# Patient Record
Sex: Male | Born: 1940
Health system: Southern US, Community
[De-identification: ages and names within clinical notes are randomized; demographics above are authoritative.]

## PROBLEM LIST (undated history)

## (undated) DIAGNOSIS — E7800 Pure hypercholesterolemia, unspecified: Secondary | ICD-10-CM

## (undated) DIAGNOSIS — I10 Essential (primary) hypertension: Secondary | ICD-10-CM

## (undated) DIAGNOSIS — M1990 Unspecified osteoarthritis, unspecified site: Secondary | ICD-10-CM

## (undated) DIAGNOSIS — Z8739 Personal history of other diseases of the musculoskeletal system and connective tissue: Secondary | ICD-10-CM

## (undated) DIAGNOSIS — Z972 Presence of dental prosthetic device (complete) (partial): Secondary | ICD-10-CM

## (undated) DIAGNOSIS — Z950 Presence of cardiac pacemaker: Secondary | ICD-10-CM

## (undated) DIAGNOSIS — G4733 Obstructive sleep apnea (adult) (pediatric): Secondary | ICD-10-CM

## (undated) HISTORY — DX: Essential (primary) hypertension: I10

---

## 1997-11-23 ENCOUNTER — Emergency Department (HOSPITAL_COMMUNITY): Admission: EM | Admit: 1997-11-23 | Discharge: 1997-11-23 | Payer: Self-pay | Admitting: Emergency Medicine

## 1997-11-23 ENCOUNTER — Encounter: Payer: Self-pay | Admitting: Emergency Medicine

## 1998-06-11 ENCOUNTER — Ambulatory Visit (HOSPITAL_COMMUNITY): Admission: RE | Admit: 1998-06-11 | Discharge: 1998-06-11 | Payer: Self-pay | Admitting: Family Medicine

## 1998-10-04 ENCOUNTER — Encounter: Payer: Self-pay | Admitting: Emergency Medicine

## 1998-10-04 ENCOUNTER — Emergency Department (HOSPITAL_COMMUNITY): Admission: EM | Admit: 1998-10-04 | Discharge: 1998-10-04 | Payer: Self-pay | Admitting: Emergency Medicine

## 1999-08-10 ENCOUNTER — Ambulatory Visit (HOSPITAL_COMMUNITY): Admission: RE | Admit: 1999-08-10 | Discharge: 1999-08-10 | Payer: Self-pay | Admitting: Family Medicine

## 1999-08-10 ENCOUNTER — Encounter: Payer: Self-pay | Admitting: Family Medicine

## 2000-05-11 ENCOUNTER — Ambulatory Visit (HOSPITAL_COMMUNITY): Admission: RE | Admit: 2000-05-11 | Discharge: 2000-05-11 | Payer: Self-pay | Admitting: Family Medicine

## 2000-09-28 ENCOUNTER — Emergency Department (HOSPITAL_COMMUNITY): Admission: EM | Admit: 2000-09-28 | Discharge: 2000-09-28 | Payer: Self-pay | Admitting: Emergency Medicine

## 2000-09-28 ENCOUNTER — Encounter: Payer: Self-pay | Admitting: Emergency Medicine

## 2000-11-13 ENCOUNTER — Ambulatory Visit (HOSPITAL_COMMUNITY): Admission: RE | Admit: 2000-11-13 | Discharge: 2000-11-13 | Payer: Self-pay | Admitting: *Deleted

## 2001-10-07 ENCOUNTER — Emergency Department (HOSPITAL_COMMUNITY): Admission: EM | Admit: 2001-10-07 | Discharge: 2001-10-07 | Payer: Self-pay | Admitting: *Deleted

## 2002-10-29 ENCOUNTER — Encounter: Admission: RE | Admit: 2002-10-29 | Discharge: 2002-10-29 | Payer: Self-pay | Admitting: Family Medicine

## 2002-10-29 ENCOUNTER — Encounter: Payer: Self-pay | Admitting: Family Medicine

## 2002-12-11 ENCOUNTER — Encounter: Payer: Self-pay | Admitting: Family Medicine

## 2002-12-11 ENCOUNTER — Encounter: Admission: RE | Admit: 2002-12-11 | Discharge: 2002-12-11 | Payer: Self-pay | Admitting: Family Medicine

## 2003-09-03 ENCOUNTER — Emergency Department (HOSPITAL_COMMUNITY): Admission: EM | Admit: 2003-09-03 | Discharge: 2003-09-03 | Payer: Self-pay | Admitting: Emergency Medicine

## 2004-08-17 ENCOUNTER — Emergency Department (HOSPITAL_COMMUNITY): Admission: EM | Admit: 2004-08-17 | Discharge: 2004-08-17 | Payer: Self-pay | Admitting: Emergency Medicine

## 2004-11-28 ENCOUNTER — Emergency Department (HOSPITAL_COMMUNITY): Admission: EM | Admit: 2004-11-28 | Discharge: 2004-11-28 | Payer: Self-pay | Admitting: Family Medicine

## 2005-01-14 ENCOUNTER — Encounter: Admission: RE | Admit: 2005-01-14 | Discharge: 2005-01-14 | Payer: Self-pay | Admitting: Family Medicine

## 2005-03-05 ENCOUNTER — Emergency Department (HOSPITAL_COMMUNITY): Admission: EM | Admit: 2005-03-05 | Discharge: 2005-03-05 | Payer: Self-pay | Admitting: Emergency Medicine

## 2005-08-19 ENCOUNTER — Emergency Department (HOSPITAL_COMMUNITY): Admission: EM | Admit: 2005-08-19 | Discharge: 2005-08-19 | Payer: Self-pay | Admitting: Family Medicine

## 2006-04-13 ENCOUNTER — Emergency Department (HOSPITAL_COMMUNITY): Admission: EM | Admit: 2006-04-13 | Discharge: 2006-04-13 | Payer: Self-pay | Admitting: Emergency Medicine

## 2006-06-07 ENCOUNTER — Encounter (INDEPENDENT_AMBULATORY_CARE_PROVIDER_SITE_OTHER): Payer: Self-pay | Admitting: Specialist

## 2006-06-07 ENCOUNTER — Observation Stay (HOSPITAL_COMMUNITY): Admission: RE | Admit: 2006-06-07 | Discharge: 2006-06-08 | Payer: Self-pay | Admitting: Otolaryngology

## 2006-06-07 HISTORY — PX: MICROLARYNGOSCOPY: SHX5208

## 2006-07-05 ENCOUNTER — Encounter: Admission: RE | Admit: 2006-07-05 | Discharge: 2006-10-03 | Payer: Self-pay | Admitting: Otolaryngology

## 2008-02-13 ENCOUNTER — Emergency Department (HOSPITAL_COMMUNITY): Admission: EM | Admit: 2008-02-13 | Discharge: 2008-02-13 | Payer: Self-pay | Admitting: Emergency Medicine

## 2009-05-26 ENCOUNTER — Emergency Department (HOSPITAL_COMMUNITY): Admission: EM | Admit: 2009-05-26 | Discharge: 2009-05-26 | Payer: Self-pay | Admitting: Emergency Medicine

## 2009-05-30 ENCOUNTER — Emergency Department (HOSPITAL_COMMUNITY): Admission: EM | Admit: 2009-05-30 | Discharge: 2009-05-30 | Payer: Self-pay | Admitting: Emergency Medicine

## 2009-07-21 ENCOUNTER — Inpatient Hospital Stay (HOSPITAL_COMMUNITY): Admission: RE | Admit: 2009-07-21 | Discharge: 2009-07-22 | Payer: Self-pay | Admitting: Orthopedic Surgery

## 2009-07-21 ENCOUNTER — Encounter (INDEPENDENT_AMBULATORY_CARE_PROVIDER_SITE_OTHER): Payer: Self-pay | Admitting: Orthopedic Surgery

## 2009-07-21 HISTORY — PX: ELBOW BURSA SURGERY: SHX615

## 2010-05-24 LAB — ANAEROBIC CULTURE

## 2010-05-24 LAB — TISSUE CULTURE: Culture: NO GROWTH

## 2010-05-24 LAB — COMPREHENSIVE METABOLIC PANEL
Albumin: 3.4 g/dL — ABNORMAL LOW (ref 3.5–5.2)
BUN: 28 mg/dL — ABNORMAL HIGH (ref 6–23)
Chloride: 108 mEq/L (ref 96–112)
Creatinine, Ser: 1.61 mg/dL — ABNORMAL HIGH (ref 0.4–1.5)
Glucose, Bld: 102 mg/dL — ABNORMAL HIGH (ref 70–99)
Total Bilirubin: 0.6 mg/dL (ref 0.3–1.2)

## 2010-05-24 LAB — CBC
HCT: 38.9 % — ABNORMAL LOW (ref 39.0–52.0)
Hemoglobin: 13.4 g/dL (ref 13.0–17.0)
MCV: 91.9 fL (ref 78.0–100.0)
Platelets: 219 10*3/uL (ref 150–400)
RDW: 13.9 % (ref 11.5–15.5)
WBC: 7.5 10*3/uL (ref 4.0–10.5)

## 2010-05-24 LAB — DIFFERENTIAL
Basophils Absolute: 0 10*3/uL (ref 0.0–0.1)
Basophils Relative: 1 % (ref 0–1)
Lymphocytes Relative: 32 % (ref 12–46)
Monocytes Absolute: 0.5 10*3/uL (ref 0.1–1.0)
Neutro Abs: 4.2 10*3/uL (ref 1.7–7.7)
Neutrophils Relative %: 56 % (ref 43–77)

## 2010-05-24 LAB — AFB CULTURE WITH SMEAR (NOT AT ARMC): Acid Fast Smear: NONE SEEN

## 2010-05-24 LAB — WOUND CULTURE

## 2010-07-23 NOTE — Op Note (Signed)
NAMETERRAN, [name]             ACCOUNT NO.:  [id number]   MEDICAL RECORD NO.:  [id number]          PATIENT TYPE:  OBV   LOCATION:  2550                         FACILITY:  [facility]   PHYSICIAN:  Zola Button T. Lazarus Salines, M.D. DATE OF BIRTH:  Apr 21, 1940   DATE OF PROCEDURE:  06/07/2006  DATE OF DISCHARGE:                               OPERATIVE REPORT   PREOPERATIVE DIAGNOSIS:  Bilateral vocal cord lesions.   POSTOPERATIVE DIAGNOSES:  Bilateral benign vocal nodules.   PROCEDURE PERFORMED:  Microlaryngoscopy with excision bilateral vocal  cord lesions.   ANESTHESIA:  General orotracheal.   BLOOD LOSS:  None.   COMPLICATIONS:  None.   FINDINGS:  Semi-pedunculated small left anterior vocal cord lesion.  A  sessile 5 mm right vocal cord lesion with a slightly irregular surface  contour.   PROCEDURE:  With the patient in the comfortable supine position, general  orotracheal anesthesia was induced without difficulty.  At an  appropriate level, the table was turned 90 degrees and the patient  placed in a slight reverse Trendelenburg.  A rubber tooth guard was  placed.  Several different varieties of anterior commissure scopes were  inserted, the first two noted to be simply too big to work effectively.  Finally the Jako laryngoscope was introduced and passed into the  glottis.  The anterior vocal cords were visualized.  The scope was  suspended in the standard fashion.  A 1/2 x 1 inch cottonoid with 1:1000  epinephrine solution was placed against the anterior vocal cords for  intraoperative hemostasis.  This was allowed to remain in place for  several minutes and then removed.  The findings were as described above.   Beginning on the left side, a sharp sickle knife was used to make a  delimiting incision just above the lesion on the left side.  The sickle  knife was used to dissect the membrane away from the vocalis muscle  atraumatically.  Upon dissecting to the inferior extent of the  lesion,  anterior and posterior delimiting incisions were made with sharp  scissors and then using the sickle knife, the lesion was excised and  sent for permanent interpretation.  Hemostasis was spontaneous.   Next onto the right side, a delimiting incision was made with a sharp  sickle knife, just above the lesion.  A spatula knife was used to  dissect Reinke's layer away from the vocalis muscle to the full extent  of the lesion.  Anterior and posterior delimiting incisions were made  using curved laryngeal scissors and these curved incisions were carried  beneath the lesion and the lesion was fully excised.  It was sent for  frozen section interpretation.  A small additional tag of mucosa at the  anterior border was excised and included with the specimen.  Hemostasis  was spontaneous.  The incision on the left side had a tendency to close  spontaneously.  On the right side the larger incision did not completely  close spontaneously.  Another epinephrine moistened pledget was placed  between the vocal cords for hemostasis.  The laryngoscope was partially  unsuspended to take the  tension off of the tongue base while awaiting  the frozen section report.   After receiving the frozen section report as benign, the pledget was  removed.  A small amount of blood was suctioned free from the endolarynx  and from the pharynx.  The laryngoscope was unsuspended and gently  removed.  The rubber tooth guard was removed and the dental status was  intact.  The patient was returned to anesthesia, awakened, extubated,  and transferred to recovery in stable condition.   COMMENT:  A 69 year old black male with a history of hoarseness and  throat clearing and lesions on office examination not consistent with  benign mature vocal nodules but suspicious for possible papillomatosis  was indication for today's procedure.  Anticipate routine postoperative  recovery with attention to analgesia, antireflux  measures, elevation,  and relative voice rest.  Given his obstructive sleep apnea, will  observe him 23-hour observation.      Gloris Manchester. Lazarus Salines, M.D.  Electronically Signed     KTW/MEDQ  D:  06/07/2006  T:  06/07/2006  Job:  [id number]   cc:   Renaye Rakers, M.D.

## 2010-10-07 ENCOUNTER — Encounter: Payer: Self-pay | Admitting: Gastroenterology

## 2010-11-04 ENCOUNTER — Ambulatory Visit (INDEPENDENT_AMBULATORY_CARE_PROVIDER_SITE_OTHER): Payer: Medicare Other | Admitting: Gastroenterology

## 2010-11-04 ENCOUNTER — Encounter: Payer: Self-pay | Admitting: Gastroenterology

## 2010-11-04 VITALS — BP 110/50 | HR 80 | Ht 69.0 in | Wt 239.6 lb

## 2010-11-04 DIAGNOSIS — N289 Disorder of kidney and ureter, unspecified: Secondary | ICD-10-CM | POA: Insufficient documentation

## 2010-11-04 DIAGNOSIS — R609 Edema, unspecified: Secondary | ICD-10-CM

## 2010-11-04 DIAGNOSIS — M109 Gout, unspecified: Secondary | ICD-10-CM

## 2010-11-04 DIAGNOSIS — G4730 Sleep apnea, unspecified: Secondary | ICD-10-CM

## 2010-11-04 DIAGNOSIS — R600 Localized edema: Secondary | ICD-10-CM

## 2010-11-04 DIAGNOSIS — K625 Hemorrhage of anus and rectum: Secondary | ICD-10-CM

## 2010-11-04 MED ORDER — PEG-KCL-NACL-NASULF-NA ASC-C 100 G PO SOLR: 1.0000 | Freq: Once | ORAL | Status: DC

## 2010-11-04 NOTE — Progress Notes (Signed)
History of Present Illness:  This is a very pleasant 70 year old African American male who has had some occasional bright red blood with wiping. Had colonoscopy in 1999 which was a very poor exam because of a poor prep. That time he did have prominent mixed hemorrhoids. Otherwise, he currently is asymptomatic and denies abdominal pain, irregular bowel habits, or any upper GI or hepatobiliary complaints. Review of his labs from July show normal CBC with mild renal insufficiency with a serum creatinine of 1.6 mg percent. The patient does have essential hypertension. He denies abuse of alcohol, cigarettes, or NSAIDs. His appetite is good and his weight is stable, and he denies any food intolerances. Past history is positive for hypertension and sleep apnea with a CPAP machine use.  I have reviewed this patient's present history, medical and surgical past history, allergies and medications.     ROS: The remainder of the 10 point ROS is negative... history of peripheral edema with daily Lasix use. He denies concurrent cardiovascular pulmonary, or genitourinary problems. Family history is noncontributory. He does have a history of gouty arthritis but denies current rheumatologic problems. The patient takes Zyloprim 300 mg a day.     Physical Exam: General well developed well nourished patient in no acute distress, appearing his stated age Eyes PERRLA, no icterus, fundoscopic exam per opthamologist Skin no lesions noted Neck supple, no adenopathy, no thyroid enlargement, no tenderness Chest clear to percussion and auscultation Heart no significant murmurs, gallops or rubs noted Abdomen no hepatosplenomegaly masses or tenderness, BS normal.  Extremities no acute joint lesions, there is trace to +1 pitting edema in the pretibial areas. There is no evidence of phlebitis or swollen hot joints. Neurologic patient oriented x 3, cranial nerves intact, no focal neurologic deficits noted. Psychological mental  status normal and normal affect.  Assessment and plan: Bright red blood per rectum probably from mixed hemorrhoids. He is due for followup colonoscopy screening which has been scheduled with propofol sedation because of his sleep apnea and mild chronic renal insufficiency from hypertension. Blood pressure today is 110/50 and pulse was 80 and regular. He is obese with a BMI of 35.38.  Encounter Diagnosis  Name Primary?  Marland Kitchen BRBPR (bright red blood per rectum) Yes

## 2010-11-04 NOTE — Patient Instructions (Signed)
Your procedure has been scheduled for 11/19/2010, please follow the seperate instructions.  Your prescription(s) have been sent to you pharmacy.   

## 2010-11-05 ENCOUNTER — Encounter: Payer: Self-pay | Admitting: Gastroenterology

## 2010-11-19 ENCOUNTER — Encounter: Payer: Self-pay | Admitting: Gastroenterology

## 2010-11-19 ENCOUNTER — Ambulatory Visit (AMBULATORY_SURGERY_CENTER): Payer: Medicare Other | Admitting: Gastroenterology

## 2010-11-19 VITALS — BP 147/76 | HR 72 | Temp 98.8°F | Resp 16 | Ht 69.0 in | Wt 239.0 lb

## 2010-11-19 DIAGNOSIS — K5730 Diverticulosis of large intestine without perforation or abscess without bleeding: Secondary | ICD-10-CM

## 2010-11-19 DIAGNOSIS — K648 Other hemorrhoids: Secondary | ICD-10-CM

## 2010-11-19 DIAGNOSIS — K625 Hemorrhage of anus and rectum: Secondary | ICD-10-CM

## 2010-11-19 MED ORDER — SODIUM CHLORIDE 0.9 % IV SOLN: 500.0000 mL | INTRAVENOUS | Status: DC

## 2010-11-19 NOTE — Patient Instructions (Signed)
Discharge instructions instructions given with verbal understanding. Handouts on diverticulosis and a high fiber diet given. Resume previous medications.

## 2010-11-22 ENCOUNTER — Telehealth: Payer: Self-pay | Admitting: *Deleted

## 2010-11-22 NOTE — Telephone Encounter (Signed)

## 2011-01-21 ENCOUNTER — Emergency Department (HOSPITAL_COMMUNITY): Payer: No Typology Code available for payment source

## 2011-01-21 ENCOUNTER — Emergency Department (HOSPITAL_COMMUNITY)
Admission: EM | Admit: 2011-01-21 | Discharge: 2011-01-21 | Disposition: A | Discharge status: Home / Self Care | Payer: No Typology Code available for payment source | Attending: Emergency Medicine | Admitting: Emergency Medicine

## 2011-01-21 ENCOUNTER — Encounter (HOSPITAL_COMMUNITY): Payer: Self-pay | Admitting: Cardiology

## 2011-01-21 DIAGNOSIS — S39012A Strain of muscle, fascia and tendon of lower back, initial encounter: Secondary | ICD-10-CM

## 2011-01-21 DIAGNOSIS — IMO0002 Reserved for concepts with insufficient information to code with codable children: Secondary | ICD-10-CM

## 2011-01-21 DIAGNOSIS — S139XXA Sprain of joints and ligaments of unspecified parts of neck, initial encounter: Secondary | ICD-10-CM | POA: Insufficient documentation

## 2011-01-21 DIAGNOSIS — M545 Low back pain, unspecified: Secondary | ICD-10-CM | POA: Insufficient documentation

## 2011-01-21 DIAGNOSIS — M546 Pain in thoracic spine: Secondary | ICD-10-CM | POA: Insufficient documentation

## 2011-01-21 DIAGNOSIS — S239XXA Sprain of unspecified parts of thorax, initial encounter: Secondary | ICD-10-CM | POA: Insufficient documentation

## 2011-01-21 DIAGNOSIS — M25519 Pain in unspecified shoulder: Secondary | ICD-10-CM | POA: Insufficient documentation

## 2011-01-21 DIAGNOSIS — S335XXA Sprain of ligaments of lumbar spine, initial encounter: Secondary | ICD-10-CM | POA: Insufficient documentation

## 2011-01-21 DIAGNOSIS — M542 Cervicalgia: Secondary | ICD-10-CM | POA: Insufficient documentation

## 2011-01-21 DIAGNOSIS — J45909 Unspecified asthma, uncomplicated: Secondary | ICD-10-CM | POA: Insufficient documentation

## 2011-01-21 DIAGNOSIS — S161XXA Strain of muscle, fascia and tendon at neck level, initial encounter: Secondary | ICD-10-CM

## 2011-01-21 DIAGNOSIS — G4730 Sleep apnea, unspecified: Secondary | ICD-10-CM | POA: Insufficient documentation

## 2011-01-21 DIAGNOSIS — I10 Essential (primary) hypertension: Secondary | ICD-10-CM | POA: Insufficient documentation

## 2011-01-21 LAB — URINALYSIS, ROUTINE W REFLEX MICROSCOPIC
Glucose, UA: NEGATIVE mg/dL
Leukocytes, UA: NEGATIVE
Nitrite: NEGATIVE
Protein, ur: NEGATIVE mg/dL
Urobilinogen, UA: 0.2 mg/dL (ref 0.0–1.0)

## 2011-01-21 MED ORDER — TRAMADOL HCL 50 MG PO TABS: 50.0000 mg | ORAL_TABLET | Freq: Four times a day (QID) | ORAL | Status: AC | PRN
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
## 2011-01-21 NOTE — ED Notes (Signed)
Pt removed from the LSB via Dr. Karma Ganja. Pt remains in c-collar at this time. No acute distress noted.

## 2011-10-03 ENCOUNTER — Emergency Department (HOSPITAL_COMMUNITY)
Admission: EM | Admit: 2011-10-03 | Discharge: 2011-10-03 | Disposition: A | Discharge status: Home / Self Care | Payer: No Typology Code available for payment source | Attending: Emergency Medicine | Admitting: Emergency Medicine

## 2011-10-03 ENCOUNTER — Encounter (HOSPITAL_COMMUNITY): Payer: Self-pay | Admitting: Emergency Medicine

## 2011-10-03 ENCOUNTER — Emergency Department (HOSPITAL_COMMUNITY): Payer: No Typology Code available for payment source

## 2011-10-03 DIAGNOSIS — Y92410 Unspecified street and highway as the place of occurrence of the external cause: Secondary | ICD-10-CM | POA: Insufficient documentation

## 2011-10-03 DIAGNOSIS — I10 Essential (primary) hypertension: Secondary | ICD-10-CM | POA: Insufficient documentation

## 2011-10-03 DIAGNOSIS — M542 Cervicalgia: Secondary | ICD-10-CM | POA: Insufficient documentation

## 2011-10-03 DIAGNOSIS — G4730 Sleep apnea, unspecified: Secondary | ICD-10-CM | POA: Insufficient documentation

## 2011-10-03 DIAGNOSIS — J45909 Unspecified asthma, uncomplicated: Secondary | ICD-10-CM | POA: Insufficient documentation

## 2011-10-03 MED ORDER — HYDROCODONE-ACETAMINOPHEN 5-325 MG PO TABS
1.0000 | ORAL_TABLET | Freq: Once | ORAL | Status: AC
  Administered 2011-10-03: 1 via ORAL
  Filled 2011-10-03: qty 1

## 2011-10-03 MED ORDER — METHOCARBAMOL 500 MG PO TABS: 500.0000 mg | ORAL_TABLET | Freq: Two times a day (BID) | ORAL | Status: AC
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
## 2011-10-03 NOTE — ED Notes (Signed)
Patient transported to X-ray 
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
## 2012-01-17 ENCOUNTER — Encounter (HOSPITAL_COMMUNITY): Payer: Self-pay | Admitting: *Deleted

## 2012-01-17 ENCOUNTER — Emergency Department (HOSPITAL_COMMUNITY)
Admission: EM | Admit: 2012-01-17 | Discharge: 2012-01-17 | Disposition: A | Discharge status: Home / Self Care | Payer: Medicare Other | Source: Home / Self Care | Attending: Family Medicine | Admitting: Family Medicine

## 2012-01-17 ENCOUNTER — Emergency Department (INDEPENDENT_AMBULATORY_CARE_PROVIDER_SITE_OTHER): Payer: Medicare Other

## 2012-01-17 DIAGNOSIS — S92309A Fracture of unspecified metatarsal bone(s), unspecified foot, initial encounter for closed fracture: Secondary | ICD-10-CM

## 2012-01-17 DIAGNOSIS — S92301A Fracture of unspecified metatarsal bone(s), right foot, initial encounter for closed fracture: Secondary | ICD-10-CM

## 2012-01-17 MED ORDER — HYDROCODONE-ACETAMINOPHEN 5-325 MG PO TABS: 1.0000 | ORAL_TABLET | ORAL | Status: DC | PRN
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
## 2012-01-17 NOTE — ED Notes (Addendum)
Pt reports yesterday he was on a ladder cleaning gutters and fell onto his feet.  Today he c/o pain right foot--plantar surface.  Yesterday he had low back pain, which is now resolved.   He denies LOC.  or head/neck pain today.

## 2013-03-07 HISTORY — PX: HAMMER TOE SURGERY: SHX385

## 2014-06-17 DIAGNOSIS — I10 Essential (primary) hypertension: Secondary | ICD-10-CM | POA: Diagnosis not present

## 2014-06-17 DIAGNOSIS — E782 Mixed hyperlipidemia: Secondary | ICD-10-CM | POA: Diagnosis not present

## 2014-06-24 DIAGNOSIS — M1000 Idiopathic gout, unspecified site: Secondary | ICD-10-CM | POA: Diagnosis not present

## 2014-06-24 DIAGNOSIS — R7301 Impaired fasting glucose: Secondary | ICD-10-CM | POA: Diagnosis not present

## 2014-06-24 DIAGNOSIS — I10 Essential (primary) hypertension: Secondary | ICD-10-CM | POA: Diagnosis not present

## 2014-06-24 DIAGNOSIS — E782 Mixed hyperlipidemia: Secondary | ICD-10-CM | POA: Diagnosis not present

## 2014-10-14 DIAGNOSIS — L84 Corns and callosities: Secondary | ICD-10-CM | POA: Diagnosis not present

## 2014-10-14 DIAGNOSIS — D2271 Melanocytic nevi of right lower limb, including hip: Secondary | ICD-10-CM | POA: Diagnosis not present

## 2014-10-14 DIAGNOSIS — M2041 Other hammer toe(s) (acquired), right foot: Secondary | ICD-10-CM | POA: Diagnosis not present

## 2014-10-14 DIAGNOSIS — D485 Neoplasm of uncertain behavior of skin: Secondary | ICD-10-CM | POA: Diagnosis not present

## 2014-10-14 DIAGNOSIS — L602 Onychogryphosis: Secondary | ICD-10-CM | POA: Diagnosis not present

## 2014-10-27 DIAGNOSIS — H02423 Myogenic ptosis of bilateral eyelids: Secondary | ICD-10-CM | POA: Diagnosis not present

## 2014-10-27 DIAGNOSIS — H25813 Combined forms of age-related cataract, bilateral: Secondary | ICD-10-CM | POA: Diagnosis not present

## 2014-11-04 DIAGNOSIS — D485 Neoplasm of uncertain behavior of skin: Secondary | ICD-10-CM | POA: Diagnosis not present

## 2014-11-04 DIAGNOSIS — M2041 Other hammer toe(s) (acquired), right foot: Secondary | ICD-10-CM | POA: Diagnosis not present

## 2014-11-27 DIAGNOSIS — Z01818 Encounter for other preprocedural examination: Secondary | ICD-10-CM | POA: Diagnosis not present

## 2014-11-27 DIAGNOSIS — R7301 Impaired fasting glucose: Secondary | ICD-10-CM | POA: Diagnosis not present

## 2014-11-27 DIAGNOSIS — I10 Essential (primary) hypertension: Secondary | ICD-10-CM | POA: Diagnosis not present

## 2014-11-27 DIAGNOSIS — M1000 Idiopathic gout, unspecified site: Secondary | ICD-10-CM | POA: Diagnosis not present

## 2014-11-27 DIAGNOSIS — E782 Mixed hyperlipidemia: Secondary | ICD-10-CM | POA: Diagnosis not present

## 2014-12-03 ENCOUNTER — Encounter (HOSPITAL_BASED_OUTPATIENT_CLINIC_OR_DEPARTMENT_OTHER): Payer: Self-pay | Admitting: *Deleted

## 2014-12-05 DIAGNOSIS — M2041 Other hammer toe(s) (acquired), right foot: Secondary | ICD-10-CM | POA: Diagnosis not present

## 2014-12-05 DIAGNOSIS — D485 Neoplasm of uncertain behavior of skin: Secondary | ICD-10-CM | POA: Diagnosis not present

## 2014-12-08 ENCOUNTER — Encounter (HOSPITAL_BASED_OUTPATIENT_CLINIC_OR_DEPARTMENT_OTHER): Payer: Self-pay | Admitting: *Deleted

## 2014-12-08 NOTE — Progress Notes (Signed)
NPO AFTER MN.  ARRIVE AT 0700.  NEEDS ISTAT AND EKG.  WILL TAKE AM MEDS WITH EXCEPTION NO LASIX W/ SIPS OF WATER DOS.

## 2014-12-10 ENCOUNTER — Encounter (HOSPITAL_BASED_OUTPATIENT_CLINIC_OR_DEPARTMENT_OTHER): Payer: Self-pay | Admitting: Podiatry

## 2014-12-11 ENCOUNTER — Other Ambulatory Visit: Payer: Self-pay

## 2014-12-11 ENCOUNTER — Encounter (HOSPITAL_BASED_OUTPATIENT_CLINIC_OR_DEPARTMENT_OTHER)
Admission: RE | Disposition: A | Discharge status: Home / Self Care | Payer: Self-pay | Source: Ambulatory Visit | Attending: Podiatry

## 2014-12-11 ENCOUNTER — Ambulatory Visit (HOSPITAL_BASED_OUTPATIENT_CLINIC_OR_DEPARTMENT_OTHER): Payer: Commercial Managed Care - HMO | Admitting: Anesthesiology

## 2014-12-11 ENCOUNTER — Encounter (HOSPITAL_BASED_OUTPATIENT_CLINIC_OR_DEPARTMENT_OTHER): Payer: Self-pay | Admitting: *Deleted

## 2014-12-11 ENCOUNTER — Ambulatory Visit (HOSPITAL_BASED_OUTPATIENT_CLINIC_OR_DEPARTMENT_OTHER)
Admission: RE | Admit: 2014-12-11 | Discharge: 2014-12-11 | Disposition: A | Discharge status: Home / Self Care | Payer: Commercial Managed Care - HMO | Source: Ambulatory Visit | Attending: Podiatry | Admitting: Podiatry

## 2014-12-11 DIAGNOSIS — Z7982 Long term (current) use of aspirin: Secondary | ICD-10-CM | POA: Insufficient documentation

## 2014-12-11 DIAGNOSIS — M2041 Other hammer toe(s) (acquired), right foot: Secondary | ICD-10-CM | POA: Diagnosis not present

## 2014-12-11 DIAGNOSIS — N189 Chronic kidney disease, unspecified: Secondary | ICD-10-CM | POA: Insufficient documentation

## 2014-12-11 DIAGNOSIS — M24574 Contracture, right foot: Secondary | ICD-10-CM | POA: Diagnosis not present

## 2014-12-11 DIAGNOSIS — I4510 Unspecified right bundle-branch block: Secondary | ICD-10-CM | POA: Insufficient documentation

## 2014-12-11 DIAGNOSIS — Z79899 Other long term (current) drug therapy: Secondary | ICD-10-CM | POA: Insufficient documentation

## 2014-12-11 DIAGNOSIS — R7301 Impaired fasting glucose: Secondary | ICD-10-CM | POA: Diagnosis not present

## 2014-12-11 DIAGNOSIS — M1990 Unspecified osteoarthritis, unspecified site: Secondary | ICD-10-CM | POA: Diagnosis not present

## 2014-12-11 DIAGNOSIS — L989 Disorder of the skin and subcutaneous tissue, unspecified: Secondary | ICD-10-CM | POA: Insufficient documentation

## 2014-12-11 DIAGNOSIS — E782 Mixed hyperlipidemia: Secondary | ICD-10-CM | POA: Insufficient documentation

## 2014-12-11 DIAGNOSIS — I10 Essential (primary) hypertension: Secondary | ICD-10-CM | POA: Insufficient documentation

## 2014-12-11 DIAGNOSIS — M109 Gout, unspecified: Secondary | ICD-10-CM | POA: Diagnosis not present

## 2014-12-11 DIAGNOSIS — I129 Hypertensive chronic kidney disease with stage 1 through stage 4 chronic kidney disease, or unspecified chronic kidney disease: Secondary | ICD-10-CM | POA: Diagnosis not present

## 2014-12-11 DIAGNOSIS — G4730 Sleep apnea, unspecified: Secondary | ICD-10-CM | POA: Insufficient documentation

## 2014-12-11 DIAGNOSIS — D485 Neoplasm of uncertain behavior of skin: Secondary | ICD-10-CM | POA: Diagnosis not present

## 2014-12-11 HISTORY — PX: HAMMER TOE SURGERY: SHX385

## 2014-12-11 HISTORY — DX: Presence of dental prosthetic device (complete) (partial): Z97.2

## 2014-12-11 HISTORY — DX: Unspecified osteoarthritis, unspecified site: M19.90

## 2014-12-11 HISTORY — DX: Obstructive sleep apnea (adult) (pediatric): G47.33

## 2014-12-11 LAB — POCT I-STAT, CHEM 8
BUN: 24 mg/dL — ABNORMAL HIGH (ref 6–20)
Calcium, Ion: 1.21 mmol/L (ref 1.13–1.30)
Chloride: 104 mmol/L (ref 101–111)
Creatinine, Ser: 1.1 mg/dL (ref 0.61–1.24)
Glucose, Bld: 104 mg/dL — ABNORMAL HIGH (ref 65–99)
HCT: 40 % (ref 39.0–52.0)
Hemoglobin: 13.6 g/dL (ref 13.0–17.0)
Potassium: 4.6 mmol/L (ref 3.5–5.1)
Sodium: 140 mmol/L (ref 135–145)
TCO2: 22 mmol/L (ref 0–100)

## 2014-12-11 SURGERY — CORRECTION, HAMMER TOE
Anesthesia: General | Site: Foot | Laterality: Right

## 2014-12-11 MED ORDER — LACTATED RINGERS IV SOLN
INTRAVENOUS | Status: DC
  Administered 2014-12-11 (×2): via INTRAVENOUS
  Filled 2014-12-11: qty 1000

## 2014-12-11 MED ORDER — FENTANYL CITRATE (PF) 100 MCG/2ML IJ SOLN
INTRAMUSCULAR | Status: AC
  Filled 2014-12-11: qty 4

## 2014-12-11 MED ORDER — ACETAMINOPHEN 325 MG PO TABS
650.0000 mg | ORAL_TABLET | ORAL | Status: DC | PRN
  Filled 2014-12-11: qty 2

## 2014-12-11 MED ORDER — SODIUM CHLORIDE 0.9 % IR SOLN
Status: DC | PRN
  Administered 2014-12-11: 500 mL

## 2014-12-11 MED ORDER — DEXAMETHASONE SODIUM PHOSPHATE 4 MG/ML IJ SOLN
INTRAMUSCULAR | Status: DC | PRN
  Administered 2014-12-11: 10 mg via INTRAVENOUS

## 2014-12-11 MED ORDER — FENTANYL CITRATE (PF) 100 MCG/2ML IJ SOLN
25.0000 ug | INTRAMUSCULAR | Status: DC | PRN
  Filled 2014-12-11: qty 1

## 2014-12-11 MED ORDER — ACETAMINOPHEN 10 MG/ML IV SOLN
INTRAVENOUS | Status: DC | PRN
  Administered 2014-12-11: 1000 mg via INTRAVENOUS

## 2014-12-11 MED ORDER — PROPOFOL 10 MG/ML IV BOLUS
INTRAVENOUS | Status: DC | PRN
  Administered 2014-12-11: 200 mg via INTRAVENOUS

## 2014-12-11 MED ORDER — FENTANYL CITRATE (PF) 100 MCG/2ML IJ SOLN
INTRAMUSCULAR | Status: DC | PRN
  Administered 2014-12-11: 25 ug via INTRAVENOUS

## 2014-12-11 MED ORDER — LACTATED RINGERS IV SOLN
INTRAVENOUS | Status: DC
  Filled 2014-12-11: qty 1000

## 2014-12-11 MED ORDER — HYDROCODONE-ACETAMINOPHEN 5-325 MG PO TABS
1.0000 | ORAL_TABLET | ORAL | Status: DC | PRN
  Filled 2014-12-11: qty 1

## 2014-12-11 MED ORDER — MIDAZOLAM HCL 2 MG/2ML IJ SOLN
INTRAMUSCULAR | Status: AC
  Filled 2014-12-11: qty 2

## 2014-12-11 MED ORDER — SODIUM CHLORIDE 0.9 % IJ SOLN
3.0000 mL | INTRAMUSCULAR | Status: DC | PRN
  Filled 2014-12-11: qty 3

## 2014-12-11 MED ORDER — CEFAZOLIN SODIUM-DEXTROSE 2-3 GM-% IV SOLR
INTRAVENOUS | Status: AC
  Filled 2014-12-11: qty 50

## 2014-12-11 MED ORDER — LIDOCAINE HCL 2 % IJ SOLN
INTRAMUSCULAR | Status: DC | PRN
  Administered 2014-12-11: 5 mL

## 2014-12-11 MED ORDER — CEFAZOLIN SODIUM-DEXTROSE 2-3 GM-% IV SOLR
2.0000 g | Freq: Once | INTRAVENOUS | Status: AC
  Administered 2014-12-11: 2 g via INTRAVENOUS
  Filled 2014-12-11: qty 50

## 2014-12-11 MED ORDER — ONDANSETRON HCL 4 MG/2ML IJ SOLN
INTRAMUSCULAR | Status: DC | PRN
  Administered 2014-12-11: 4 mg via INTRAVENOUS

## 2014-12-11 MED ORDER — BUPIVACAINE HCL 0.5 % IJ SOLN
INTRAMUSCULAR | Status: DC | PRN
  Administered 2014-12-11: 5 mL

## 2014-12-11 MED ORDER — EPHEDRINE SULFATE 50 MG/ML IJ SOLN
INTRAMUSCULAR | Status: DC | PRN
  Administered 2014-12-11: 10 mg via INTRAVENOUS

## 2014-12-11 MED ORDER — LIDOCAINE HCL (CARDIAC) 20 MG/ML IV SOLN
INTRAVENOUS | Status: DC | PRN
  Administered 2014-12-11: 60 mg via INTRAVENOUS

## 2014-12-11 SURGICAL SUPPLY — 53 items
BANDAGE CO FLEX L/F 2IN X 5YD (GAUZE/BANDAGES/DRESSINGS) ×1 IMPLANT
BANDAGE COBAN STERILE 2 (GAUZE/BANDAGES/DRESSINGS) ×2 IMPLANT
BANDAGE ELASTIC 6 VELCRO ST LF (GAUZE/BANDAGES/DRESSINGS) IMPLANT
BLADE OSC/SAG .038X5.5 CUT EDG (BLADE) ×2 IMPLANT
BLADE SAW SAGI 13.0X70X1.19 (BLADE) IMPLANT
BLADE SAW SAGI 13.0X70X1.19MM (BLADE)
BLADE SURG 15 STRL LF DISP TIS (BLADE) ×4 IMPLANT
BLADE SURG 15 STRL SS (BLADE) ×12
BNDG CMPR 9X4 STRL LF SNTH (GAUZE/BANDAGES/DRESSINGS) ×1
BNDG CONFORM 2 STRL LF (GAUZE/BANDAGES/DRESSINGS) ×3 IMPLANT
BNDG ESMARK 4X9 LF (GAUZE/BANDAGES/DRESSINGS) ×3 IMPLANT
BUR SIDE CUT 44.8 STRL (BURR) IMPLANT
BURR SIDE CUT 44.8 STRL (BURR)
BURR SIDE CUT 44.8MML STRL (BURR)
COVER BACK TABLE 60X90IN (DRAPES) ×3 IMPLANT
COVER MAYO STAND STRL (DRAPES) ×3 IMPLANT
CUFF TOURN SGL QUICK 18 (TOURNIQUET CUFF) ×2 IMPLANT
DRAPE EXTREMITY T 121X128X90 (DRAPE) ×3 IMPLANT
DRAPE OEC MINIVIEW 54X84 (DRAPES) ×1 IMPLANT
DRSG EMULSION OIL 3X3 NADH (GAUZE/BANDAGES/DRESSINGS) IMPLANT
DURAPREP 26ML APPLICATOR (WOUND CARE) ×3 IMPLANT
ELECT REM PT RETURN 9FT ADLT (ELECTROSURGICAL) ×3
ELECTRODE REM PT RTRN 9FT ADLT (ELECTROSURGICAL) ×1 IMPLANT
GAUZE SPONGE 4X4 16PLY XRAY LF (GAUZE/BANDAGES/DRESSINGS) ×3 IMPLANT
GAUZE XEROFORM 1X8 LF (GAUZE/BANDAGES/DRESSINGS) ×3 IMPLANT
GLOVE BIO SURGEON STRL SZ7.5 (GLOVE) ×3 IMPLANT
GLOVE BIOGEL PI IND STRL 7.5 (GLOVE) ×1 IMPLANT
GLOVE BIOGEL PI INDICATOR 7.5 (GLOVE) ×2
GOWN STRL REUS W/ TWL XL LVL3 (GOWN DISPOSABLE) ×1 IMPLANT
GOWN STRL REUS W/TWL XL LVL3 (GOWN DISPOSABLE) ×3
KWIRE 4.0 X .045IN (WIRE) IMPLANT
MANIFOLD NEPTUNE II (INSTRUMENTS) IMPLANT
NEEDLE HYPO 22GX1.5 SAFETY (NEEDLE) ×5 IMPLANT
PACK BASIN DAY SURGERY FS (CUSTOM PROCEDURE TRAY) ×3 IMPLANT
PADDING CAST ABS 4INX4YD NS (CAST SUPPLIES)
PADDING CAST ABS COTTON 4X4 ST (CAST SUPPLIES) IMPLANT
PADDING CAST SYNTHETIC 2 (CAST SUPPLIES)
PADDING CAST SYNTHETIC 2X4 NS (CAST SUPPLIES) IMPLANT
PADDING CAST SYNTHETIC 3 NS LF (CAST SUPPLIES)
PADDING CAST SYNTHETIC 3X4 NS (CAST SUPPLIES) IMPLANT
PENCIL BUTTON HOLSTER BLD 10FT (ELECTRODE) IMPLANT
SPONGE GAUZE 4X4 12PLY STER LF (GAUZE/BANDAGES/DRESSINGS) ×2 IMPLANT
SPONGE LAP 4X18 X RAY DECT (DISPOSABLE) ×1 IMPLANT
STOCKINETTE 6  STRL (DRAPES) ×2
STOCKINETTE 6 STRL (DRAPES) ×1 IMPLANT
STOCKINETTE SYNTHETIC 4 NONSTR (MISCELLANEOUS) ×3 IMPLANT
SUT ETHILON 4 0 PS 2 18 (SUTURE) ×2 IMPLANT
SUT ETHILON 5 0 PS 2 18 (SUTURE) ×2 IMPLANT
SUT VIC AB 3-0 FS2 27 (SUTURE) ×1 IMPLANT
SUT VICRYL 4-0 PS2 18IN ABS (SUTURE) ×3 IMPLANT
SYR BULB 3OZ (MISCELLANEOUS) ×3 IMPLANT
SYR CONTROL 10ML LL (SYRINGE) ×6 IMPLANT
UNDERPAD 30X30 INCONTINENT (UNDERPADS AND DIAPERS) ×3 IMPLANT

## 2014-12-11 NOTE — Anesthesia Procedure Notes (Signed)
Procedure Name: LMA Insertion Date/Time: 12/11/2014 9:03 AM Performed by: Mechele Claude Pre-anesthesia Checklist: Patient identified, Emergency Drugs available, Suction available and Patient being monitored Patient Re-evaluated:Patient Re-evaluated prior to inductionOxygen Delivery Method: Circle System Utilized Preoxygenation: Pre-oxygenation with 100% oxygen Intubation Type: IV induction Ventilation: Mask ventilation without difficulty LMA: LMA inserted LMA Size: 5.0 Number of attempts: 1 Airway Equipment and Method: bite block Placement Confirmation: positive ETCO2 Tube secured with: Tape Dental Injury: Teeth and Oropharynx as per pre-operative assessment

## 2014-12-11 NOTE — Discharge Instructions (Addendum)
Follow up in office -12/16/2014 Non weight bearing x 2 weeks. Use walker. Keep dressing clean and dry-do not remove  Surgical hard sole post op shoe  Keep elevated with 2 pillows at all times. Resume all medications , including aspirin.  Call your surgeon if you experience:   1.  Fever over 101.0. 2.  Inability to urinate. 3.  Nausea and/or vomiting. 4.  Extreme swelling or bruising at the surgical site. 5.  Continued bleeding from the incision. 6.  Increased pain, redness or drainage from the incision. 7.  Problems related to your pain medication. 8. Any change in color, movement and/or sensation 9. Any problems and/or concerns  Post Anesthesia Home Care Instructions  Activity: Get plenty of rest for the remainder of the day. A responsible adult should stay with you for 24 hours following the procedure.  For the next 24 hours, DO NOT: -Drive a car -Paediatric nurse -Drink alcoholic beverages -Take any medication unless instructed by your physician -Make any legal decisions or sign important papers.  Meals: Start with liquid foods such as gelatin or soup. Progress to regular foods as tolerated. Avoid greasy, spicy, heavy foods. If nausea and/or vomiting occur, drink only clear liquids until the nausea and/or vomiting subsides. Call your physician if vomiting continues.  Special Instructions/Symptoms: Your throat may feel dry or sore from the anesthesia or the breathing tube placed in your throat during surgery. If this causes discomfort, gargle with warm salt water. The discomfort should disappear within 24 hours.  If you had a scopolamine patch placed behind your ear for the management of post- operative nausea and/or vomiting:  1. The medication in the patch is effective for 72 hours, after which it should be removed.  Wrap patch in a tissue and discard in the trash. Wash hands thoroughly with soap and water. 2. You may remove the patch earlier than 72 hours if you  experience unpleasant side effects which may include dry mouth, dizziness or visual disturbances. 3. Avoid touching the patch. Wash your hands with soap and water after contact with the patch.

## 2014-12-11 NOTE — Transfer of Care (Signed)
Last Vitals:  Filed Vitals:   12/11/14 0705  BP: 143/73  Pulse: 66  Temp: 36.5 C  Resp: 18    Immediate Anesthesia Transfer of Care Note  Patient: [name]  Procedure(s) Performed: Procedure(s) (LRB): 4th HAMMER TOE CORRECTION, EXCISION LESION 3rd TOE RIGHT FOOT (Right)  Patient Location: PACU  Anesthesia Type: General  Level of Consciousness: awake, alert  and oriented  Airway & Oxygen Therapy: Patient Spontanous Breathing and Patient connected to face mask oxygen  Post-op Assessment: Report given to PACU RN and Post -op Vital signs reviewed and stable  Post vital signs: Reviewed and stable  Complications: No apparent anesthesia complications

## 2014-12-11 NOTE — Evaluation (Signed)
Physical Therapy Evaluation Patient Details Name: [name] MRN: [id number] DOB: [birth year] Today's Date: 12/11/2014   History of Present Illness  post 4 toe hammer toe fixation and removal skinn lesion 3rd toe  Clinical Impression  Patient has much difficulty with ambulating with NWB, has to stop and place the R heel to steady, instructed in backward on the  Steps with no rails, lost balance. Wife will have another person available to assist into the house. Patient has a crutch at home that he feels will be safer for up the 2 steps. Reiterated NWB of the R foot.    Follow Up Recommendations No PT follow up    Equipment Recommendations  Rolling walker with 5" wheels    Recommendations for Other Services       Precautions / Restrictions Precautions Precautions: Fall Required Braces or Orthoses: Other Brace/Splint Other Brace/Splint: post op shoe Restrictions Weight Bearing Restrictions: Yes RLE Weight Bearing: Non weight bearing      Mobility  Bed Mobility                  Transfers Overall transfer level: Needs assistance Equipment used: Rolling walker (2 wheeled) Transfers: Sit to/from Stand Sit to Stand: Supervision         General transfer comment: cues for safety, patient is impulsive  Ambulation/Gait Ambulation/Gait assistance: Min assist Ambulation Distance (Feet): 10 Feet Assistive device: Rolling walker (2 wheeled) Gait Pattern/deviations: Step-to pattern     General Gait Details: cues for  NWB , patient places weight on heel at times.  Stairs Stairs: Yes Stairs assistance: Max assist Stair Management: No rails;Backwards;With walker Number of Stairs: 2 General stair comments: wife present , instructed in backwards  with RW, patient is unable to hoist self to the step, placed weight through the R  heel  when stepping up, when coming down, patient hoped down, lost balance, steady assist to catch patient and resteady.  Wheelchair  Mobility    Modified Rankin (Stroke Patients Only)       Balance Overall balance assessment: Needs assistance         Standing balance support: During functional activity;Bilateral upper extremity supported Standing balance-Leahy Scale: Poor Standing balance comment: patuient is unsteady to walk NWB, encouraf=ged to limit walking at home, wife present                             Pertinent Vitals/Pain Pain Assessment: No/denies pain    Home Living Family/patient expects to be discharged to:: Private residence Living Arrangements: Spouse/significant other Available Help at Discharge: Family Type of Home: House Home Access: Stairs to enter Entrance Stairs-Rails: None Technical brewer of Steps: 2 Home Layout: One level Home Equipment: Environmental consultant - 2 wheels      Prior Function Level of Independence: Independent               Hand Dominance        Extremity/Trunk Assessment               Lower Extremity Assessment: Overall WFL for tasks assessed         Communication   Communication: No difficulties  Cognition Arousal/Alertness: Awake/alert Behavior During Therapy: WFL for tasks assessed/performed Overall Cognitive Status: Within Functional Limits for tasks assessed                      General Comments      Exercises  Assessment/Plan    PT Assessment Patent does not need any further PT services  PT Diagnosis Difficulty walking;Abnormality of gait   PT Problem List    PT Treatment Interventions     PT Goals (Current goals can be found in the Care Plan section) Acute Rehab PT Goals PT Goal Formulation: All assessment and education complete, DC therapy    Frequency     Barriers to discharge        Co-evaluation               End of Session   Activity Tolerance: Patient tolerated treatment well Patient left: in chair;with family/visitor present;with nursing/sitter in room      Functional  Assessment Tool Used: clinical judgement Functional Limitation: Mobility: Walking and moving around Mobility: Walking and Moving Around Current Status (T6144): At least 1 percent but less than 20 percent impaired, limited or restricted Mobility: Walking and Moving Around Goal Status [phone]): At least 1 percent but less than 20 percent impaired, limited or restricted Mobility: Walking and Moving Around Discharge Status [phone]): At least 1 percent but less than 20 percent impaired, limited or restricted    Time: 1325-1341 PT Time Calculation (min) (ACUTE ONLY): 16 min   Charges:         PT G Codes:   PT G-Codes **NOT FOR INPATIENT CLASS** Functional Assessment Tool Used: clinical judgement Functional Limitation: Mobility: Walking and moving around Mobility: Walking and Moving Around Current Status (P9509): At least 1 percent but less than 20 percent impaired, limited or restricted Mobility: Walking and Moving Around Goal Status [phone]): At least 1 percent but less than 20 percent impaired, limited or restricted Mobility: Walking and Moving Around Discharge Status [phone]): At least 1 percent but less than 20 percent impaired, limited or restricted    Claretha Cooper 12/11/2014, 2:24 PM Tresa Endo PT [phone]

## 2014-12-11 NOTE — Anesthesia Postprocedure Evaluation (Signed)
  Anesthesia Post-op Note  Patient: [name]  Procedure(s) Performed: Procedure(s) (LRB): 4th HAMMER TOE CORRECTION, EXCISION LESION 3rd TOE RIGHT FOOT (Right)  Patient Location: PACU  Anesthesia Type: General  Level of Consciousness: awake and alert   Airway and Oxygen Therapy: Patient Spontanous Breathing  Post-op Pain: mild  Post-op Assessment: Post-op Vital signs reviewed, Patient's Cardiovascular Status Stable, Respiratory Function Stable, Patent Airway and No signs of Nausea or vomiting  Last Vitals:  Filed Vitals:   12/11/14 1251  BP: 145/72  Pulse: 66  Temp: 36.4 C  Resp: 16    Post-op Vital Signs: stable   Complications: No apparent anesthesia complications

## 2014-12-11 NOTE — Anesthesia Preprocedure Evaluation (Signed)
Anesthesia Evaluation  Patient identified by MRN, date of birth, ID band Patient awake    Reviewed: Allergy & Precautions, H&P , NPO status , Patient's Chart, lab work & pertinent test results  Airway Mallampati: III  TM Distance: >3 FB Neck ROM: full    Dental no notable dental hx. (+) Dental Advisory Given, Edentulous Upper   Pulmonary sleep apnea ,    Pulmonary exam normal breath sounds clear to auscultation       Cardiovascular Exercise Tolerance: Good hypertension, Pt. on medications negative cardio ROS Normal cardiovascular exam Rhythm:regular Rate:Normal     Neuro/Psych negative neurological ROS  negative psych ROS   GI/Hepatic negative GI ROS, Neg liver ROS,   Endo/Other  negative endocrine ROS  Renal/GU Renal InsufficiencyRenal diseaseMild renal insufficiency  negative genitourinary   Musculoskeletal   Abdominal   Peds  Hematology negative hematology ROS (+)   Anesthesia Other Findings   Reproductive/Obstetrics negative OB ROS                             Anesthesia Physical Anesthesia Plan  ASA: III  Anesthesia Plan: General   Post-op Pain Management:    Induction: Intravenous  Airway Management Planned: LMA  Additional Equipment:   Intra-op Plan:   Post-operative Plan:   Informed Consent: I have reviewed the patients History and Physical, chart, labs and discussed the procedure including the risks, benefits and alternatives for the proposed anesthesia with the patient or authorized representative who has indicated his/her understanding and acceptance.   Dental Advisory Given  Plan Discussed with: CRNA and Surgeon  Anesthesia Plan Comments:         Anesthesia Quick Evaluation

## 2014-12-12 NOTE — Op Note (Signed)
NAMECASHIUS, GRANDSTAFF              ACCOUNT NO.:  [id number]  MEDICAL RECORD NO.:  [id number]  LOCATION:                               FACILITY:  [facility]  PHYSICIAN:  Twanna Hy. Naela Nodal, D.P.M.DATE OF BIRTH:  04/20/1940  DATE OF PROCEDURE:  12/11/2014 DATE OF DISCHARGE:  12/11/2014                              OPERATIVE REPORT   SURGEON:  Twanna Hy. Diondre Pulis, D.P.M.  ASSISTANT:  None.  PREOPERATIVE DIAGNOSES: 1. Fourth right hammer toe. 2. Third right toe lesion of skin of unspecified and unknown behavior     and need for rotational flap.  POSTOPERATIVE DIAGNOSIS: 1. Fourth right hammer toe. 2. Third right toe lesion of skin of unspecified and unknown behavior     and need for rotational flap.  PROCEDURES: 1. Fourth right hammer toe repair. 2. Excision of lesion, third right toe. 3. Skin plasty, right third toe.  ANESTHESIA:  LMA.  COMPLICATIONS:  None.  DESCRIPTION OF PROCEDURE:  The patient was brought to the OR and placed in the supine position, at which time, an LMA was administered.  The patient was prepped and draped in usual aseptic manner.  The foot was exsanguinated with an Esmarch bandage and the previously applied tourniquet inflated to 250 mmHg.  Total of 10 mL of 0.5% Marcaine plain and 1% Lidocaine plain was infiltrated around the surgical site.  First, attention was directed to the third digit where the suspicious lesion was identified at the distal aspect, this was surgically excised at all borders, and did not appear to penetrate through the subcutaneous tissue.  This was sent to Pathology for evaluation.  Second procedure; next, Schrudde rotational flap was performed where skin was taken inferior to the original incision site.  We carefully dissected and rotated.  This area was irrigated and then reapproximated using 5-0 nylon.  Third procedure; attention was directed to the fourth digit where 2 semi- elliptical incisions were made from distal medial to  proximal lateral. Wedge skin was resected, care was taken to clamp and cauterize all bleeding vessels and ensuring retraction of neurovascular structures encountered.  The extensive tendon apparatus was released using a 15 blade and soft tissue structures freed from the head of the proximal phalanx to the fourth right toe.  Once exposed, the head of the fourth proximal phalanx was resected using a power saw.  Area was irrigated with copious amounts of sterile saline and antibiotic solution.  The tendon was reapproximated using 4-0 Vicryl, and skin closure was accomplished with 5-0 nylon.  Foot was dressed with Xeroform, 4x4s, Kling, and Coban.  The tourniquet was deflated and vascular status returned to all digits.  The patient was sent to the recovery room with vital signs stable and capillary refill time at presurgical levels.  Both written and oral postoperative instructions were given to the patient.  No guarantees given.          ______________________________ Twanna Hy. Fritzi Mandes, D.P.M.     MJA/MEDQ  D:  12/11/2014  T:  12/11/2014  Job:  [id number]

## 2014-12-15 ENCOUNTER — Encounter (HOSPITAL_BASED_OUTPATIENT_CLINIC_OR_DEPARTMENT_OTHER): Payer: Self-pay | Admitting: Podiatry

## 2014-12-16 DIAGNOSIS — M2041 Other hammer toe(s) (acquired), right foot: Secondary | ICD-10-CM | POA: Diagnosis not present

## 2014-12-26 DIAGNOSIS — R7301 Impaired fasting glucose: Secondary | ICD-10-CM | POA: Diagnosis not present

## 2014-12-26 DIAGNOSIS — E782 Mixed hyperlipidemia: Secondary | ICD-10-CM | POA: Diagnosis not present

## 2014-12-26 DIAGNOSIS — M1000 Idiopathic gout, unspecified site: Secondary | ICD-10-CM | POA: Diagnosis not present

## 2014-12-26 DIAGNOSIS — I10 Essential (primary) hypertension: Secondary | ICD-10-CM | POA: Diagnosis not present

## 2015-01-07 DIAGNOSIS — I10 Essential (primary) hypertension: Secondary | ICD-10-CM | POA: Diagnosis not present

## 2015-01-07 DIAGNOSIS — M1000 Idiopathic gout, unspecified site: Secondary | ICD-10-CM | POA: Diagnosis not present

## 2015-01-07 DIAGNOSIS — E782 Mixed hyperlipidemia: Secondary | ICD-10-CM | POA: Diagnosis not present

## 2015-01-07 DIAGNOSIS — Z23 Encounter for immunization: Secondary | ICD-10-CM | POA: Diagnosis not present

## 2015-01-07 DIAGNOSIS — R7301 Impaired fasting glucose: Secondary | ICD-10-CM | POA: Diagnosis not present

## 2015-01-07 DIAGNOSIS — Z0000 Encounter for general adult medical examination without abnormal findings: Secondary | ICD-10-CM | POA: Diagnosis not present

## 2015-01-07 DIAGNOSIS — M159 Polyosteoarthritis, unspecified: Secondary | ICD-10-CM | POA: Diagnosis not present

## 2015-01-09 DIAGNOSIS — Z09 Encounter for follow-up examination after completed treatment for conditions other than malignant neoplasm: Secondary | ICD-10-CM | POA: Diagnosis not present

## 2015-01-09 DIAGNOSIS — M2041 Other hammer toe(s) (acquired), right foot: Secondary | ICD-10-CM | POA: Diagnosis not present

## 2015-02-15 ENCOUNTER — Emergency Department (HOSPITAL_COMMUNITY): Payer: Commercial Managed Care - HMO

## 2015-02-15 ENCOUNTER — Emergency Department (HOSPITAL_COMMUNITY)
Admission: EM | Admit: 2015-02-15 | Discharge: 2015-02-15 | Disposition: A | Discharge status: Home / Self Care | Payer: Commercial Managed Care - HMO | Attending: Emergency Medicine | Admitting: Emergency Medicine

## 2015-02-15 ENCOUNTER — Encounter (HOSPITAL_COMMUNITY): Payer: Self-pay | Admitting: Emergency Medicine

## 2015-02-15 DIAGNOSIS — M109 Gout, unspecified: Secondary | ICD-10-CM | POA: Diagnosis not present

## 2015-02-15 DIAGNOSIS — Z79899 Other long term (current) drug therapy: Secondary | ICD-10-CM | POA: Insufficient documentation

## 2015-02-15 DIAGNOSIS — Z8669 Personal history of other diseases of the nervous system and sense organs: Secondary | ICD-10-CM | POA: Diagnosis not present

## 2015-02-15 DIAGNOSIS — M1990 Unspecified osteoarthritis, unspecified site: Secondary | ICD-10-CM | POA: Insufficient documentation

## 2015-02-15 DIAGNOSIS — K8020 Calculus of gallbladder without cholecystitis without obstruction: Secondary | ICD-10-CM | POA: Diagnosis not present

## 2015-02-15 DIAGNOSIS — R1013 Epigastric pain: Secondary | ICD-10-CM | POA: Diagnosis not present

## 2015-02-15 DIAGNOSIS — R109 Unspecified abdominal pain: Secondary | ICD-10-CM

## 2015-02-15 DIAGNOSIS — Z7982 Long term (current) use of aspirin: Secondary | ICD-10-CM | POA: Insufficient documentation

## 2015-02-15 DIAGNOSIS — I10 Essential (primary) hypertension: Secondary | ICD-10-CM | POA: Diagnosis not present

## 2015-02-15 DIAGNOSIS — K8070 Calculus of gallbladder and bile duct without cholecystitis without obstruction: Secondary | ICD-10-CM

## 2015-02-15 DIAGNOSIS — N200 Calculus of kidney: Secondary | ICD-10-CM | POA: Diagnosis not present

## 2015-02-15 DIAGNOSIS — R1030 Lower abdominal pain, unspecified: Secondary | ICD-10-CM | POA: Diagnosis present

## 2015-02-15 LAB — COMPREHENSIVE METABOLIC PANEL
ALBUMIN: 3.8 g/dL (ref 3.5–5.0)
ALK PHOS: 64 U/L (ref 38–126)
ALT: 26 U/L (ref 17–63)
ANION GAP: 10 (ref 5–15)
AST: 23 U/L (ref 15–41)
BILIRUBIN TOTAL: 0.8 mg/dL (ref 0.3–1.2)
BUN: 26 mg/dL — AB (ref 6–20)
CALCIUM: 8.8 mg/dL — AB (ref 8.9–10.3)
CO2: 28 mmol/L (ref 22–32)
Chloride: 102 mmol/L (ref 101–111)
Creatinine, Ser: 1.29 mg/dL — ABNORMAL HIGH (ref 0.61–1.24)
GFR calc Af Amer: >60 mL/min (ref >60)
GFR, EST NON AFRICAN AMERICAN: 53 mL/min — AB (ref >60)
GLUCOSE: 123 mg/dL — AB (ref 65–99)
Potassium: 3.5 mmol/L (ref 3.5–5.1)
Sodium: 140 mmol/L (ref 135–145)
TOTAL PROTEIN: 6.4 g/dL — AB (ref 6.5–8.1)

## 2015-02-15 LAB — URINE MICROSCOPIC-ADD ON: Squamous Epithelial / HPF: NONE SEEN

## 2015-02-15 LAB — URINALYSIS, ROUTINE W REFLEX MICROSCOPIC
Bilirubin Urine: NEGATIVE
GLUCOSE, UA: NEGATIVE mg/dL
Ketones, ur: NEGATIVE mg/dL
Leukocytes, UA: NEGATIVE
Nitrite: NEGATIVE
Protein, ur: NEGATIVE mg/dL
SPECIFIC GRAVITY, URINE: 1.03 (ref 1.005–1.030)
pH: 6 (ref 5.0–8.0)

## 2015-02-15 LAB — CBC WITH DIFFERENTIAL/PLATELET
BASOS PCT: 0 %
Basophils Absolute: 0 10*3/uL (ref 0.0–0.1)
Eosinophils Absolute: 0.2 10*3/uL (ref 0.0–0.7)
Eosinophils Relative: 3 %
HEMATOCRIT: 38.9 % — AB (ref 39.0–52.0)
HEMOGLOBIN: 13.1 g/dL (ref 13.0–17.0)
LYMPHS ABS: 3.2 10*3/uL (ref 0.7–4.0)
LYMPHS PCT: 43 %
MCH: 30.3 pg (ref 26.0–34.0)
MCHC: 33.7 g/dL (ref 30.0–36.0)
MCV: 90 fL (ref 78.0–100.0)
MONOS PCT: 9 %
Monocytes Absolute: 0.6 10*3/uL (ref 0.1–1.0)
NEUTROS ABS: 3.3 10*3/uL (ref 1.7–7.7)
Neutrophils Relative %: 45 %
Platelets: 194 10*3/uL (ref 150–400)
RBC: 4.32 MIL/uL (ref 4.22–5.81)
RDW: 13.9 % (ref 11.5–15.5)
WBC: 7.3 10*3/uL (ref 4.0–10.5)

## 2015-02-15 LAB — LIPASE, BLOOD: Lipase: 57 U/L — ABNORMAL HIGH (ref 11–51)

## 2015-02-15 LAB — I-STAT TROPONIN, ED
Troponin i, poc: 0 ng/mL (ref 0.00–0.08)
Troponin i, poc: 0 ng/mL (ref 0.00–0.08)

## 2015-02-15 MED ORDER — MORPHINE SULFATE (PF) 4 MG/ML IV SOLN
4.0000 mg | Freq: Once | INTRAVENOUS | Status: AC
  Administered 2015-02-15: 4 mg via INTRAVENOUS
  Filled 2015-02-15: qty 1

## 2015-02-15 MED ORDER — SODIUM CHLORIDE 0.9 % IV BOLUS (SEPSIS)
1000.0000 mL | Freq: Once | INTRAVENOUS | Status: AC
  Administered 2015-02-15: 1000 mL via INTRAVENOUS

## 2015-02-15 MED ORDER — IOHEXOL 300 MG/ML  SOLN
100.0000 mL | Freq: Once | INTRAMUSCULAR | Status: AC | PRN
  Administered 2015-02-15: 100 mL via INTRAVENOUS

## 2015-02-15 MED ORDER — MORPHINE SULFATE (PF) 4 MG/ML IV SOLN
4.0000 mg | Freq: Once | INTRAVENOUS | Status: DC
  Filled 2015-02-15: qty 1

## 2015-02-15 MED ORDER — ONDANSETRON 4 MG PO TBDP: 4.0000 mg | ORAL_TABLET | Freq: Three times a day (TID) | ORAL | Status: DC | PRN

## 2015-02-15 MED ORDER — ONDANSETRON HCL 4 MG/2ML IJ SOLN
4.0000 mg | Freq: Once | INTRAMUSCULAR | Status: AC
  Administered 2015-02-15: 4 mg via INTRAVENOUS
  Filled 2015-02-15: qty 2

## 2015-02-15 MED ORDER — DOCUSATE SODIUM 100 MG PO CAPS: 100.0000 mg | ORAL_CAPSULE | Freq: Two times a day (BID) | ORAL | Status: DC

## 2015-02-15 MED ORDER — OXYCODONE-ACETAMINOPHEN 5-325 MG PO TABS: 1.0000 | ORAL_TABLET | Freq: Four times a day (QID) | ORAL | Status: DC | PRN

## 2015-02-15 NOTE — ED Notes (Signed)
Pt called out and stated "it's hard for me to breathe" Pt was laying down in the bed, this RN raised the head of the pt up and helped the pt get up in the bed, now the pt states "i'm more comfortable now and can breathe better"

## 2015-02-15 NOTE — ED Notes (Signed)
Pt verbalized understanding of d/c instructions and has no further questions. Pt stable and NAD. Pt d/c home with wife driving 

## 2015-02-15 NOTE — Discharge Instructions (Signed)
Cholelithiasis Cholelithiasis (also called gallstones) is a form of gallbladder disease in which gallstones form in your gallbladder. The gallbladder is an organ that stores bile made in the liver, which helps digest fats. Gallstones begin as small crystals and slowly grow into stones. Gallstone pain occurs when the gallbladder spasms and a gallstone is blocking the duct. Pain can also occur when a stone passes out of the duct.  RISK FACTORS  Being male.   Having multiple pregnancies. Health care providers sometimes advise removing diseased gallbladders before future pregnancies.   Being obese.  Eating a diet heavy in fried foods and fat.   Being older than 29 years and increasing age.   Prolonged use of medicines containing male hormones.   Having diabetes mellitus.   Rapidly losing weight.   Having a family history of gallstones (heredity).  SYMPTOMS  Nausea.   Vomiting.  Abdominal pain.   Yellowing of the skin (jaundice).   Sudden pain. It may persist from several minutes to several hours.  Fever.   Tenderness to the touch. In some cases, when gallstones do not move into the bile duct, people have no pain or symptoms. These are called "silent" gallstones.  TREATMENT Silent gallstones do not need treatment. In severe cases, emergency surgery may be required. Options for treatment include:  Surgery to remove the gallbladder. This is the most common treatment.  Medicines. These do not always work and may take 6-12 months or more to work.  Shock wave treatment (extracorporeal biliary lithotripsy). In this treatment an ultrasound machine sends shock waves to the gallbladder to break gallstones into smaller pieces that can pass into the intestines or be dissolved by medicine. HOME CARE INSTRUCTIONS   Only take over-the-counter or prescription medicines for pain, discomfort, or fever as directed by your health care provider.   Follow a low-fat diet until  seen again by your health care provider. Fat causes the gallbladder to contract, which can result in pain.   Follow up with your health care provider as directed. Attacks are almost always recurrent and surgery is usually required for permanent treatment.  SEEK IMMEDIATE MEDICAL CARE IF:   Your pain increases and is not controlled by medicines.   You have a fever or persistent symptoms for more than 2-3 days.   You have a fever and your symptoms suddenly get worse.   You have persistent nausea and vomiting.  MAKE SURE YOU:   Understand these instructions.  Will watch your condition.  Will get help right away if you are not doing well or get worse.   This information is not intended to replace advice given to you by your health care provider. Make sure you discuss any questions you have with your health care provider.   Document Released: 02/17/2005 Document Revised: 10/24/2012 Document Reviewed: 08/15/2012 Elsevier Interactive Patient Education 2016 [place] Diet for Pancreatitis or Gallbladder Conditions A low-fat diet can be helpful if you have pancreatitis or a gallbladder condition. With these conditions, your pancreas and gallbladder have trouble digesting fats. A healthy eating plan with less fat will help rest your pancreas and gallbladder and reduce your symptoms. WHAT DO I NEED TO KNOW ABOUT THIS DIET?  Eat a low-fat diet.  Reduce your fat intake to less than 20-30% of your total daily calories. This is less than 50-60 g of fat per day.  Remember that you need some fat in your diet. Ask your dietician what your daily goal should be.  Choose nonfat and low-fat healthy foods. Look for the words "nonfat," "low fat," or "fat free."  As a guide, look on the label and choose foods with less than 3 g of fat per serving. Eat only one serving.  Avoid alcohol.  Do not smoke. If you need help quitting, talk with your health care provider.  Eat small  frequent meals instead of three large heavy meals. WHAT FOODS CAN I EAT? Grains Include healthy grains and starches such as potatoes, wheat bread, fiber-rich cereal, and brown rice. Choose whole grain options whenever possible. In adults, whole grains should account for 45-65% of your daily calories.  Fruits and Vegetables Eat plenty of fruits and vegetables. Fresh fruits and vegetables add fiber to your diet. Meats and Other Protein Sources Eat lean meat such as chicken and pork. Trim any fat off of meat before cooking it. Eggs, fish, and beans are other sources of protein. In adults, these foods should account for 10-35% of your daily calories. Dairy Choose low-fat milk and dairy options. Dairy includes fat and protein, as well as calcium.  Fats and Oils Limit high-fat foods such as fried foods, sweets, baked goods, sugary drinks.  Other Creamy sauces and condiments, such as mayonnaise, can add extra fat. Think about whether or not you need to use them, or use smaller amounts or low fat options. WHAT FOODS ARE NOT RECOMMENDED?  High fat foods, such as:  [employer].  Ice cream.  [place] toast.  Sweet rolls.  Pizza.  Cheese bread.  Foods covered with batter, butter, creamy sauces, or cheese.  Fried foods.  Sugary drinks and desserts.  Foods that cause gas or bloating   This information is not intended to replace advice given to you by your health care provider. Make sure you discuss any questions you have with your health care provider.   Document Released: 02/26/2013 Document Reviewed: 02/26/2013 Elsevier Interactive Patient Education [employer].

## 2015-02-15 NOTE — ED Notes (Signed)
Pt reports mid/upper abdominal pain that started 2 hour that started suddenly. Denies nvd, diaphoresis or sob.

## 2015-02-15 NOTE — ED Provider Notes (Signed)
By signing my name below, I, Randa Evens, attest that this documentation has been prepared under the direction and in the presence of [employer], DO. Electronically Signed: Randa Evens, ED Scribe. 02/15/2015. 2:59 AM.  TIME SEEN: 2:59 AM   CHIEF COMPLAINT: Abdominal pain  HPI: HPI Comments: [name] is a 74 y.o. male with history of hypertension, sleep apnea who presents to the Emergency Department complaining of new throbbing lower abdominal pain onset 3 hours PTA. Pt states since being in the ED the pains has radiated to the epigastric region. Pt states that he had some resolved SOB that lasted for only a few seconds. Pt states that lying in the ED bed makes the pain worse. Pt also is complaining of back pain but states that began since laying in the ED bed. Pt denies any alleviating factors. Pt doesn't report any medications PTA. Pt denies CP, n/v/d, blood in stool, melena, constipation, penile discharge, testicular swelling, dysuria or hematuria, numbness or weakness. Pt does report ETOH use tonight. Pt denies Hx of abdominal surgeries. No history of pancreatitis. Denies that he has ever had similar symptoms.    ROS: See HPI Constitutional: no fever  Eyes: no drainage  ENT: no runny nose   Cardiovascular:  no chest pain  Resp:  SOB  GI: no vomiting GU: no dysuria Integumentary: no rash  Allergy: no hives  Musculoskeletal: no leg swelling  Neurological: no slurred speech ROS otherwise negative  PAST MEDICAL HISTORY/PAST SURGICAL HISTORY:  Past Medical History  Diagnosis Date  . Hypertension   . Gout   . Arthritis   . Wears dentures     upper  . OSA (obstructive sleep apnea)     per pt noncompliant CPAP has not use it since 2015    MEDICATIONS:  Prior to Admission medications   Medication Sig Start Date End Date Taking? Authorizing Provider  allopurinol (ZYLOPRIM) 300 MG tablet Take 300 mg by mouth every morning.     Historical Provider, MD  aspirin EC  81 MG tablet Take 81 mg by mouth daily.      Historical Provider, MD  atorvastatin (LIPITOR) 20 MG tablet Take 20 mg by mouth every morning.    Historical Provider, MD  furosemide (LASIX) 80 MG tablet Take 80 mg by mouth every morning.     Historical Provider, MD  Multiple Vitamins-Minerals (MULTIVITAMINS THER. W/MINERALS) TABS Take 1 tablet by mouth daily.      Historical Provider, MD  olmesartan (BENICAR) 40 MG tablet Take 40 mg by mouth every morning.     Historical Provider, MD  potassium chloride SA (K-DUR,KLOR-CON) 20 MEQ tablet Take 20 mEq by mouth every morning.     Historical Provider, MD    ALLERGIES:  No Known Allergies  SOCIAL HISTORY:  Social History  Substance Use Topics  . Smoking status: Never Smoker   . Smokeless tobacco: Never Used  . Alcohol Use: Yes     Comment: OCCASIONAL    FAMILY HISTORY: Family History  Problem Relation Age of Onset  . Colon cancer Neg Hx     EXAM: BP 156/80 mmHg  Pulse 66  Temp(Src) 97.6 F (36.4 C) (Oral)  Resp 16  Ht 5\' 10"  (1.778 m)  Wt 223 lb (101.152 kg)  BMI 32.00 kg/m2  SpO2 100%   CONSTITUTIONAL: Alert and oriented and responds appropriately to questions. Well-appearing; well-nourished, obese, appears uncomfortable but is afebrile and nontoxic and not in significant distress HEAD: Normocephalic EYES: Conjunctivae clear,  PERRL ENT: normal nose; no rhinorrhea; moist mucous membranes; pharynx without lesions noted NECK: Supple, no meningismus, no LAD  CARD: RRR; S1 and S2 appreciated; no murmurs, no clicks, no rubs, no gallops RESP: Normal chest excursion without splinting or tachypnea; breath sounds clear and equal bilaterally; no wheezes, no rhonchi, no rales, no hypoxia or respiratory distress, speaking full sentences ABD/GI: Normal bowel sounds; non-distended; soft, diffusely tender throughout the abdomen worse in epigastric region, no rebound, no guarding, no peritoneal signs, negative Murphy sign BACK:  The back appears  normal and is non-tender to palpation, there is no CVA tenderness EXT: Normal ROM in all joints; non-tender to palpation; no edema; normal capillary refill; no cyanosis, no calf tenderness or swelling    SKIN: Normal color for age and race; warm NEURO: Moves all extremities equally, sensation to light touch intact diffusely, cranial nerves II through XII intact PSYCH: The patient's mood and manner are appropriate. Grooming and personal hygiene are appropriate.  MEDICAL DECISION MAKING: Patient here with abdominal pain. No chest pain. States he did have several seconds of shortness of breath but this resolved. EKG shows right bundle branch block with no new ischemic changes, arrhythmia or new interval change. Labs ordered in triage show negative troponin. Normal LFTs. Mildly elevated lipase. Differential diagnosis includes gastritis, GERD, pancreatitis, colitis, less likely ACS. Doubt dissection. Will give IV fluids, pain and nausea medicine.  Pt is already asking when he can go home.  Have encouraged him to stay for workup which he agrees to.  ED PROGRESS: 5:15 AM  Pt's second troponin is negative. Urine shows trace hemoglobin in rare bacteria but no other sign of infection. He is not currently having any urinary symptoms. CT scan shows nonobstructing stones in both kidneys with no hydronephrosis. He has mild fatty infiltration of his liver and mild cholelithiasis without cholecystitis. No aortic aneurysm. He has diverticulosis without diverticulitis. Pain is all localized now to the epigastric region. He has a negative Murphy sign on exam. Suspect that this is biliary colic. Have recommended changes in his diet. We'll give him outpatient general surgery follow-up information. We'll discharge with pain medication. Doubt this is ACS, dissection.  He appears very comfortable on exam. He is not somatically hypertensive.  Discussed return precautions. He verbalizes understanding and is comfortable with this  plan.   EKG Interpretation  Date/Time:  Sunday February 15 2015 01:05:08 EST Ventricular Rate:  67 PR Interval:  178 QRS Duration: 154 QT Interval:  448 QTC Calculation: 473 R Axis:   122 Text Interpretation:  Normal sinus rhythm Right bundle branch block Abnormal ECG No significant change since last tracing Confirmed by Krysteena Stalker,  DO, Rebecka Oelkers [id number]) on 02/15/2015 2:13:33 AM       I personally performed the services described in this documentation, which was scribed in my presence. The recorded information has been reviewed and is accurate.    [place], DO 02/15/15 [id number]

## 2015-02-15 NOTE — ED Notes (Signed)
Pt given fluids for po challenge and tolerating well.

## 2015-07-15 DIAGNOSIS — E782 Mixed hyperlipidemia: Secondary | ICD-10-CM | POA: Diagnosis not present

## 2015-07-15 DIAGNOSIS — R7301 Impaired fasting glucose: Secondary | ICD-10-CM | POA: Diagnosis not present

## 2015-07-15 DIAGNOSIS — I10 Essential (primary) hypertension: Secondary | ICD-10-CM | POA: Diagnosis not present

## 2015-07-15 DIAGNOSIS — M1000 Idiopathic gout, unspecified site: Secondary | ICD-10-CM | POA: Diagnosis not present

## 2015-07-22 DIAGNOSIS — R7301 Impaired fasting glucose: Secondary | ICD-10-CM | POA: Diagnosis not present

## 2015-07-22 DIAGNOSIS — M1000 Idiopathic gout, unspecified site: Secondary | ICD-10-CM | POA: Diagnosis not present

## 2015-07-22 DIAGNOSIS — I10 Essential (primary) hypertension: Secondary | ICD-10-CM | POA: Diagnosis not present

## 2015-07-22 DIAGNOSIS — E782 Mixed hyperlipidemia: Secondary | ICD-10-CM | POA: Diagnosis not present

## 2015-08-27 DIAGNOSIS — R42 Dizziness and giddiness: Secondary | ICD-10-CM | POA: Diagnosis not present

## 2015-08-27 DIAGNOSIS — I959 Hypotension, unspecified: Secondary | ICD-10-CM | POA: Diagnosis not present

## 2015-08-27 DIAGNOSIS — I10 Essential (primary) hypertension: Secondary | ICD-10-CM | POA: Diagnosis not present

## 2015-09-10 DIAGNOSIS — R42 Dizziness and giddiness: Secondary | ICD-10-CM | POA: Diagnosis not present

## 2015-09-10 DIAGNOSIS — I959 Hypotension, unspecified: Secondary | ICD-10-CM | POA: Diagnosis not present

## 2015-09-23 DIAGNOSIS — R1084 Generalized abdominal pain: Secondary | ICD-10-CM | POA: Diagnosis not present

## 2015-09-23 DIAGNOSIS — N200 Calculus of kidney: Secondary | ICD-10-CM | POA: Diagnosis not present

## 2015-09-23 DIAGNOSIS — K5901 Slow transit constipation: Secondary | ICD-10-CM | POA: Diagnosis not present

## 2016-02-03 DIAGNOSIS — R7301 Impaired fasting glucose: Secondary | ICD-10-CM | POA: Diagnosis not present

## 2016-02-03 DIAGNOSIS — I10 Essential (primary) hypertension: Secondary | ICD-10-CM | POA: Diagnosis not present

## 2016-02-03 DIAGNOSIS — E782 Mixed hyperlipidemia: Secondary | ICD-10-CM | POA: Diagnosis not present

## 2016-02-03 DIAGNOSIS — Z125 Encounter for screening for malignant neoplasm of prostate: Secondary | ICD-10-CM | POA: Diagnosis not present

## 2016-02-03 DIAGNOSIS — Z23 Encounter for immunization: Secondary | ICD-10-CM | POA: Diagnosis not present

## 2016-02-03 DIAGNOSIS — M1000 Idiopathic gout, unspecified site: Secondary | ICD-10-CM | POA: Diagnosis not present

## 2016-02-03 DIAGNOSIS — Z0000 Encounter for general adult medical examination without abnormal findings: Secondary | ICD-10-CM | POA: Diagnosis not present

## 2016-02-10 DIAGNOSIS — M159 Polyosteoarthritis, unspecified: Secondary | ICD-10-CM | POA: Diagnosis not present

## 2016-02-10 DIAGNOSIS — R7301 Impaired fasting glucose: Secondary | ICD-10-CM | POA: Diagnosis not present

## 2016-02-10 DIAGNOSIS — I10 Essential (primary) hypertension: Secondary | ICD-10-CM | POA: Diagnosis not present

## 2016-02-10 DIAGNOSIS — E782 Mixed hyperlipidemia: Secondary | ICD-10-CM | POA: Diagnosis not present

## 2016-03-10 DIAGNOSIS — M7021 Olecranon bursitis, right elbow: Secondary | ICD-10-CM | POA: Diagnosis not present

## 2016-03-25 DIAGNOSIS — L309 Dermatitis, unspecified: Secondary | ICD-10-CM | POA: Diagnosis not present

## 2016-03-25 DIAGNOSIS — M7021 Olecranon bursitis, right elbow: Secondary | ICD-10-CM | POA: Diagnosis not present

## 2016-04-01 DIAGNOSIS — M7021 Olecranon bursitis, right elbow: Secondary | ICD-10-CM | POA: Diagnosis not present

## 2016-04-01 DIAGNOSIS — L239 Allergic contact dermatitis, unspecified cause: Secondary | ICD-10-CM | POA: Diagnosis not present

## 2016-06-01 DIAGNOSIS — M25521 Pain in right elbow: Secondary | ICD-10-CM | POA: Diagnosis not present

## 2016-06-01 DIAGNOSIS — M7021 Olecranon bursitis, right elbow: Secondary | ICD-10-CM | POA: Diagnosis not present

## 2016-06-27 DIAGNOSIS — M7021 Olecranon bursitis, right elbow: Secondary | ICD-10-CM | POA: Diagnosis not present

## 2016-08-10 DIAGNOSIS — E782 Mixed hyperlipidemia: Secondary | ICD-10-CM | POA: Diagnosis not present

## 2016-08-10 DIAGNOSIS — R7301 Impaired fasting glucose: Secondary | ICD-10-CM | POA: Diagnosis not present

## 2016-08-17 DIAGNOSIS — M1000 Idiopathic gout, unspecified site: Secondary | ICD-10-CM | POA: Diagnosis not present

## 2016-08-17 DIAGNOSIS — E782 Mixed hyperlipidemia: Secondary | ICD-10-CM | POA: Diagnosis not present

## 2016-08-17 DIAGNOSIS — I10 Essential (primary) hypertension: Secondary | ICD-10-CM | POA: Diagnosis not present

## 2016-08-17 DIAGNOSIS — R7303 Prediabetes: Secondary | ICD-10-CM | POA: Diagnosis not present

## 2016-09-15 DIAGNOSIS — R5383 Other fatigue: Secondary | ICD-10-CM | POA: Diagnosis not present

## 2016-09-15 DIAGNOSIS — R42 Dizziness and giddiness: Secondary | ICD-10-CM | POA: Diagnosis not present

## 2016-09-16 ENCOUNTER — Other Ambulatory Visit: Payer: Self-pay | Admitting: Internal Medicine

## 2016-09-16 DIAGNOSIS — R42 Dizziness and giddiness: Secondary | ICD-10-CM

## 2016-09-20 ENCOUNTER — Telehealth: Payer: Self-pay | Admitting: Physician Assistant

## 2016-09-20 NOTE — Telephone Encounter (Signed)
Received records from [facility] for appointment on 10/06/16 with Almyra Deforest. PA  Records put with Hao's schedule for 10/06/16. lp

## 2016-09-21 ENCOUNTER — Ambulatory Visit
Admission: RE | Admit: 2016-09-21 | Discharge: 2016-09-21 | Disposition: A | Discharge status: Home / Self Care | Payer: Commercial Managed Care - HMO | Source: Ambulatory Visit | Attending: Internal Medicine | Admitting: Internal Medicine

## 2016-09-21 DIAGNOSIS — I6522 Occlusion and stenosis of left carotid artery: Secondary | ICD-10-CM | POA: Diagnosis not present

## 2016-09-21 DIAGNOSIS — R42 Dizziness and giddiness: Secondary | ICD-10-CM

## 2016-09-27 ENCOUNTER — Encounter: Payer: Self-pay | Admitting: *Deleted

## 2016-09-28 DIAGNOSIS — J040 Acute laryngitis: Secondary | ICD-10-CM | POA: Diagnosis not present

## 2016-10-06 ENCOUNTER — Ambulatory Visit: Payer: Commercial Managed Care - HMO | Admitting: Physician Assistant

## 2016-10-26 ENCOUNTER — Ambulatory Visit (INDEPENDENT_AMBULATORY_CARE_PROVIDER_SITE_OTHER): Payer: Medicare HMO | Admitting: Physician Assistant

## 2016-10-26 ENCOUNTER — Encounter: Payer: Self-pay | Admitting: Physician Assistant

## 2016-10-26 VITALS — BP 151/86 | HR 68 | Ht 70.0 in | Wt 239.6 lb

## 2016-10-26 DIAGNOSIS — R0609 Other forms of dyspnea: Secondary | ICD-10-CM | POA: Diagnosis not present

## 2016-10-26 DIAGNOSIS — I4510 Unspecified right bundle-branch block: Secondary | ICD-10-CM

## 2016-10-26 DIAGNOSIS — N183 Chronic kidney disease, stage 3 unspecified: Secondary | ICD-10-CM

## 2016-10-26 DIAGNOSIS — E785 Hyperlipidemia, unspecified: Secondary | ICD-10-CM | POA: Diagnosis not present

## 2016-10-26 DIAGNOSIS — R42 Dizziness and giddiness: Secondary | ICD-10-CM

## 2016-10-26 DIAGNOSIS — I10 Essential (primary) hypertension: Secondary | ICD-10-CM

## 2016-10-26 NOTE — Progress Notes (Signed)
Cardiology Office Note    Date:  10/28/2016   ID:  Domanic, Matusek January 18, 1941, MRN [id number]  PCP:  Merrilee Seashore, MD  Cardiologist:  Case discussed with Dr. Debara Pickett DOD  Chief Complaint  Patient presents with  . New Patient (Initial Visit)    case discussed with Dr. Debara Pickett   Patient was referred by Dr. Ashby Dawes for evaluation of dizziness and dyspnea on exertion.  History of Present Illness:  [name] is a 76 y.o. male with PMH of HTN, HLD, CKD stage III, RBBB, OSA, and gout.  He has not been using CPAP for 1 year. He was recently evaluated by his primary care physician for dizziness and fatigue after walking. EKG showed right bundle branch block. Carotid ultrasound was ordered as well which showed less than 50% stenosis. Lab work shows normal red blood cell count, stable renal function, normal liver function. He was referred to cardiology service for further evaluation.  According to the patient, he did not have any recent chest pain. He also feels a little more shortness of breath with exertion but denies any orthopnea or PND episodes. I recommended echocardiogram to further assess. As far as his dizziness, it does not appears his dizziness is truly associated with exertion. He says sometimes he is not moving around with the dizziness spontaneously occur. It is occurring on a daily basis. We recommended a 24-hour Holter monitor to further evaluate.    Past Medical History:  Diagnosis Date  . Arthritis   . Gout   . Hypertension   . OSA (obstructive sleep apnea)    per pt noncompliant CPAP has not use it since 2015  . Wears dentures    upper    Past Surgical History:  Procedure Laterality Date  . ELBOW BURSA SURGERY Left 07-21-2009   extensive bursectomy/ tenosynovectomy/ ulnar nerve release  . HAMMER TOE SURGERY  2015   right foot  . HAMMER TOE SURGERY Right 12/11/2014   Procedure: 4th HAMMER TOE CORRECTION, EXCISION LESION 3rd TOE RIGHT FOOT;   Surgeon: Rosemary Holms, DPM;  Location: [facility];  Service: Podiatry;  Laterality: Right;  . MICROLARYNGOSCOPY  06-07-2006   w/  Excision bilateral vocal cord lesions (bilateral benign nodules)    Current Medications: Outpatient Medications Prior to Visit  Medication Sig Dispense Refill  . aspirin EC 81 MG tablet Take 81 mg by mouth daily.      Marland Kitchen atorvastatin (LIPITOR) 20 MG tablet Take 20 mg by mouth every morning.    . Multiple Vitamins-Minerals (MULTIVITAMINS THER. W/MINERALS) TABS Take 1 tablet by mouth daily.      . potassium chloride SA (K-DUR,KLOR-CON) 20 MEQ tablet Take 20 mEq by mouth every morning.     Marland Kitchen allopurinol (ZYLOPRIM) 300 MG tablet Take 300 mg by mouth every morning.     . docusate sodium (COLACE) 100 MG capsule Take 1 capsule (100 mg total) by mouth every 12 (twelve) hours. 60 capsule 0  . furosemide (LASIX) 80 MG tablet Take 80 mg by mouth every morning.     . olmesartan (BENICAR) 40 MG tablet Take 40 mg by mouth every morning.     . ondansetron (ZOFRAN ODT) 4 MG disintegrating tablet Take 1 tablet (4 mg total) by mouth every 8 (eight) hours as needed for nausea or vomiting. 20 tablet 0  . oxyCODONE-acetaminophen (PERCOCET/ROXICET) 5-325 MG tablet Take 1 tablet by mouth every 6 (six) hours as needed. 15 tablet 0   No facility-administered  medications prior to visit.      Allergies:   Patient has no known allergies.   Social History   Social History  . Marital status: Married    Spouse name: N/A  . Number of children: N/A  . Years of education: N/A   Social History Main Topics  . Smoking status: Never Smoker  . Smokeless tobacco: Never Used  . Alcohol use Yes     Comment: OCCASIONAL  . Drug use: No  . Sexual activity: Not Asked   Other Topics Concern  . None   Social History Narrative  . None     Family History:  The patient's family history includes Heart attack in his sister; Heart attack (age of onset: 85) in his father;  Osteoarthritis in his father; Stroke (age of onset: 74) in his mother.   ROS:   Please see the history of present illness.    ROS All other systems reviewed and are negative.   PHYSICAL EXAM:   VS:  BP (!) 151/86 (BP Location: Left Arm, Cuff Size: Normal)   Pulse 68   Ht 5\' 10"  (1.778 m)   Wt 239 lb 9.6 oz (108.7 kg)   BMI 34.38 kg/m    GEN: Well nourished, well developed, in no acute distress  HEENT: normal  Neck: no JVD, carotid bruits, or masses Cardiac: RRR; no murmurs, rubs, or gallops,no edema  Respiratory:  clear to auscultation bilaterally, normal work of breathing GI: soft, nontender, nondistended, + BS MS: no deformity or atrophy  Skin: warm and dry, no rash Neuro:  Alert and Oriented x 3, Strength and sensation are intact Psych: euthymic mood, full affect  Wt Readings from Last 3 Encounters:  10/26/16 239 lb 9.6 oz (108.7 kg)  02/15/15 223 lb (101.2 kg)  12/11/14 228 lb (103.4 kg)      Studies/Labs Reviewed:   EKG:  EKG is ordered today.  The ekg ordered today demonstrates Sinus bradycardia, heart rate 49, prolonged first degree heart block with occasional dropped beat. Right bundle branch block  Recent Labs: No results found for requested labs within last 8760 hours.   Lipid Panel No results found for: CHOL, TRIG, HDL, CHOLHDL, VLDL, LDLCALC, LDLDIRECT  Additional studies/ records that were reviewed today include:    Carotid US 09/21/2016 IMPRESSION: Less than 50% stenosis in the right and left internal carotid arteries.   ASSESSMENT:    1. Dizziness   2. Dyspnea on exertion   3. RBBB   4. Essential hypertension   5. Hyperlipidemia, unspecified hyperlipidemia type   6. CKD (chronic kidney disease), stage III      PLAN:  In order of problems listed above:  1. Dizziness: Despite the fact he told his primary care physician that it is related to exertion, however he says it is not really related to exertion today. It is occurring on a daily  basis. We plan to proceed with 24-hour Holter monitor.  2. Dyspnea on exertion: No dyspnea at rest, no chest pain, no orthopnea or PND. Does not appear to be volume overloaded on physical exam. We will obtain echocardiogram  3. Hypertension: Blood pressure monitor elevated in the office today, we will hold off on adjustment based on Holter monitor result.  4. Hyperlipidemia: Continue Lipitor 20 mg daily.  5. CKD stage III: Monitor by primary care provider    Medication Adjustments/Labs and Tests Ordered: Current medicines are reviewed at length with the patient today.  Concerns regarding medicines are outlined above.  Medication changes, Labs and Tests ordered today are listed in the Patient Instructions below. Patient Instructions  Medication Instructions:   No changes  Labwork:   none  Testing/Procedures:  Your physician has recommended that you wear a 24-hour holter monitor. Holter monitors are medical devices that record the heart's electrical activity. Doctors most often use these monitors to diagnose arrhythmias. Arrhythmias are problems with the speed or rhythm of the heartbeat. The monitor is a small, portable device. You can wear one while you do your normal daily activities. This is usually used to diagnose what is causing palpitations/syncope (passing out).   Your physician has requested that you have an echocardiogram. Echocardiography is a painless test that uses sound waves to create images of your heart. It provides your doctor with information about the size and shape of your heart and how well your heart's chambers and valves are working. This procedure takes approximately one hour. There are no restrictions for this procedure.    Follow-Up:  3-4 months with Dr. Debara Pickett  If you need a refill on your cardiac medications before your next appointment, please call your pharmacy.      Hilbert Corrigan, [place]  10/28/2016 8:25 AM    [address], [place], [place]  [address] Phone: [phone]; Fax: [phone]

## 2016-10-26 NOTE — Patient Instructions (Signed)
Medication Instructions:   No changes  Labwork:   none  Testing/Procedures:  Your physician has recommended that you wear a 24-hour holter monitor. Holter monitors are medical devices that record the heart's electrical activity. Doctors most often use these monitors to diagnose arrhythmias. Arrhythmias are problems with the speed or rhythm of the heartbeat. The monitor is a small, portable device. You can wear one while you do your normal daily activities. This is usually used to diagnose what is causing palpitations/syncope (passing out).   Your physician has requested that you have an echocardiogram. Echocardiography is a painless test that uses sound waves to create images of your heart. It provides your doctor with information about the size and shape of your heart and how well your heart's chambers and valves are working. This procedure takes approximately one hour. There are no restrictions for this procedure.    Follow-Up:  3-4 months with Dr. Debara Pickett  If you need a refill on your cardiac medications before your next appointment, please call your pharmacy.

## 2016-10-28 ENCOUNTER — Encounter: Payer: Self-pay | Admitting: Physician Assistant

## 2016-11-16 ENCOUNTER — Other Ambulatory Visit: Payer: Self-pay

## 2016-11-16 ENCOUNTER — Ambulatory Visit (INDEPENDENT_AMBULATORY_CARE_PROVIDER_SITE_OTHER): Payer: Medicare HMO

## 2016-11-16 ENCOUNTER — Ambulatory Visit (HOSPITAL_COMMUNITY): Payer: Medicare HMO | Attending: Cardiovascular Disease

## 2016-11-16 DIAGNOSIS — R0609 Other forms of dyspnea: Secondary | ICD-10-CM

## 2016-11-16 DIAGNOSIS — E785 Hyperlipidemia, unspecified: Secondary | ICD-10-CM | POA: Insufficient documentation

## 2016-11-16 DIAGNOSIS — I4510 Unspecified right bundle-branch block: Secondary | ICD-10-CM

## 2016-11-16 DIAGNOSIS — N189 Chronic kidney disease, unspecified: Secondary | ICD-10-CM | POA: Diagnosis not present

## 2016-11-16 DIAGNOSIS — I517 Cardiomegaly: Secondary | ICD-10-CM | POA: Insufficient documentation

## 2016-11-16 DIAGNOSIS — R42 Dizziness and giddiness: Secondary | ICD-10-CM | POA: Diagnosis not present

## 2016-11-16 DIAGNOSIS — I129 Hypertensive chronic kidney disease with stage 1 through stage 4 chronic kidney disease, or unspecified chronic kidney disease: Secondary | ICD-10-CM | POA: Insufficient documentation

## 2016-11-16 DIAGNOSIS — G4733 Obstructive sleep apnea (adult) (pediatric): Secondary | ICD-10-CM | POA: Diagnosis not present

## 2016-11-16 NOTE — Progress Notes (Signed)
Normal pumping function of heart, mild thickening in the wall between the lower chamber of heart, however nothing to explain his SOB or dizziness.

## 2016-11-24 NOTE — Progress Notes (Signed)
I have called and discussed the result with patient, please refer the patient to the earliest EP slot available for evaluation of symptomatic bradycardia to see if patient needs a pacemaker.

## 2016-11-28 ENCOUNTER — Encounter: Payer: Self-pay | Admitting: Cardiovascular Disease

## 2016-11-28 ENCOUNTER — Ambulatory Visit (INDEPENDENT_AMBULATORY_CARE_PROVIDER_SITE_OTHER): Payer: Medicare HMO | Admitting: Cardiovascular Disease

## 2016-11-28 VITALS — BP 140/76 | HR 71 | Ht 70.0 in | Wt 231.0 lb

## 2016-11-28 DIAGNOSIS — E669 Obesity, unspecified: Secondary | ICD-10-CM

## 2016-11-28 DIAGNOSIS — E7800 Pure hypercholesterolemia, unspecified: Secondary | ICD-10-CM | POA: Diagnosis not present

## 2016-11-28 DIAGNOSIS — G4733 Obstructive sleep apnea (adult) (pediatric): Secondary | ICD-10-CM | POA: Diagnosis not present

## 2016-11-28 DIAGNOSIS — I441 Atrioventricular block, second degree: Secondary | ICD-10-CM | POA: Diagnosis not present

## 2016-11-28 DIAGNOSIS — I10 Essential (primary) hypertension: Secondary | ICD-10-CM | POA: Diagnosis not present

## 2016-11-28 NOTE — Progress Notes (Signed)
Cardiology Office Note:    Date:  11/28/2016   ID:  Jared, [name] [birth year], MRN [id number]  PCP:  Merrilee Seashore, MD  Cardiologist:  Sanda Klein, MD    Referring MD: Merrilee Seashore, MD   Chief Complaint  Patient presents with  . Follow-up    halter monitor    History of Present Illness:    [name] is a 76 y.o. male with a hx of Mild obesity, hyperlipidemia and hypertension, obstructive sleep apnea, gout who was prescribed a rhythm monitor after episode of near syncope. The device has recorded several episodes of marked bradycardia as well as pauses to a maximum of 2.2 seconds related to second-degree AV block, Mobitz type I. The most profound bradycardia occurred during sleep hours, but episodes of significant bradycardia also occur during the day. He also has right bundle branch block. He has never experienced full blown syncope. Reports that since he wore the monitor he has had less dizziness.  He is not taking any medications with negative chronotropic effect.  He denies exertional angina and exertional dyspnea, leg edema, claudication, focal neurological events, falls or injuries.  Past Medical History:  Diagnosis Date  . Arthritis   . Gout   . Hypertension   . OSA (obstructive sleep apnea)    per pt noncompliant CPAP has not use it since 2015  . Wears dentures    upper    Past Surgical History:  Procedure Laterality Date  . ELBOW BURSA SURGERY Left 07-21-2009   extensive bursectomy/ tenosynovectomy/ ulnar nerve release  . HAMMER TOE SURGERY  2015   right foot  . HAMMER TOE SURGERY Right 12/11/2014   Procedure: 4th HAMMER TOE CORRECTION, EXCISION LESION 3rd TOE RIGHT FOOT;  Surgeon: Rosemary Holms, DPM;  Location: [facility];  Service: Podiatry;  Laterality: Right;  . MICROLARYNGOSCOPY  06-07-2006   w/  Excision bilateral vocal cord lesions (bilateral benign nodules)    Current Medications: Current Meds  Medication  Sig  . aspirin EC 81 MG tablet Take 81 mg by mouth daily.    Marland Kitchen atorvastatin (LIPITOR) 20 MG tablet Take 20 mg by mouth every morning.  Marland Kitchen losartan (COZAAR) 100 MG tablet Take 100 mg by mouth daily.  . Multiple Vitamins-Minerals (MULTIVITAMINS THER. W/MINERALS) TABS Take 1 tablet by mouth daily.    . potassium chloride SA (K-DUR,KLOR-CON) 20 MEQ tablet Take 20 mEq by mouth every morning.      Allergies:   Patient has no known allergies.   Social History   Social History  . Marital status: Married    Spouse name: N/A  . Number of children: N/A  . Years of education: N/A   Social History Main Topics  . Smoking status: Never Smoker  . Smokeless tobacco: Never Used  . Alcohol use Yes     Comment: OCCASIONAL  . Drug use: No  . Sexual activity: Not Asked   Other Topics Concern  . None   Social History Narrative  . None     Family History: The patient's  family history includes Heart attack in his sister; Heart attack (age of onset: 5) in his father; Osteoarthritis in his father; Stroke (age of onset: 29) in his mother. There is no history of Colon cancer. ROS:   Please see the history of present illness.      All other systems reviewed and are negative.  EKGs/Labs/Other Studies Reviewed:    The following studies were reviewed today: 24-hour Holter  monitor  EKG:  EKG is  ordered today.  The ekg ordered today demonstrates normal sinus rhythm with 1:1 AV conduction and right bundle branch block. Borderline first-degree AV block with PR 206 ms. Normal QTC when adjusted for IVCD: 469 ms  Recent Labs: No results found for requested labs within last 8760 hours.  Recent Lipid Panel No results found for: CHOL, TRIG, HDL, CHOLHDL, VLDL, LDLCALC, LDLDIRECT  Physical Exam:    VS:  BP 140/76   Pulse 71   Ht 5\' 10"  (1.778 m)   Wt 231 lb (104.8 kg)   BMI 33.15 kg/m     Wt Readings from Last 3 Encounters:  11/28/16 231 lb (104.8 kg)  10/26/16 239 lb 9.6 oz (108.7 kg)    02/15/15 223 lb (101.2 kg)     GEN: Obese Well nourished, well developed in no acute distress HEENT: Normal NECK: No JVD; No carotid bruits LYMPHATICS: No lymphadenopathy CARDIAC: Widely split S2 RRR, no murmurs, rubs, gallops RESPIRATORY:  Clear to auscultation without rales, wheezing or rhonchi  ABDOMEN: Soft, non-tender, non-distended MUSCULOSKELETAL:  No edema; No deformity  SKIN: Warm and dry NEUROLOGIC:  Alert and oriented x 3 PSYCHIATRIC:  Normal affect   ASSESSMENT:    1. Second degree atrioventricular block, Mobitz (type) I   2. Essential hypertension   3. Obstructive sleep apnea syndrome   4. Hypercholesterolemia   5. Obesity, mild    PLAN:    In order of problems listed above:  1. 2nd deg AVB, MT1: He has a symptomatic pause in excess of 2 seconds and would benefit from pacemaker implantation. Although he had Wenckebach cycles, he also has right bundle branch block suggesting he must also have infrahisian disease as well. Although many of the episodes of bradycardia occurred during sleep and could be attributed to obstructive sleep apnea, he also had episodes of bradycardia during the day when he recalls being alert. He did have some dizziness in association with one of the more significant episodes of daytime bradycardia at about 40 bpm. I have recommended pacemaker implantation and went over the device, implantation procedure, potential complications, long-term monitoring, etc. in detail. He is skeptical. He does not want a pacemaker at this time. I did prevail upon him to promise that he would call promptly if he develops full-blown syncope. He understands that if syncope recurs he should not drive until he has a pacemaker implanted. He says that she will think about getting the device implanted over the winter. 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 2. HTN: Borderline control. Avoid beta blockers and centrally acting calcium  channel blockers.. 3. OSA: Strongly recommend weight loss and use of CPAP 4. HLP: Lipid profile followed by Dr. Ashby Dawes.   Medication Adjustments/Labs and Tests Ordered: Current medicines are reviewed at length with the patient today.  Concerns regarding medicines are outlined above.  No orders of the defined types were placed in this encounter.  No orders of the defined types were placed in this encounter.   Signed, Sanda Klein, MD  11/28/2016 6:19 PM    [place]

## 2016-11-28 NOTE — Patient Instructions (Signed)
Dr Sallyanne Kuster recommends that you schedule a follow-up appointment in 12 months. You will receive a reminder letter in the mail two months in advance. If you don't receive a letter, please call our office to schedule the follow-up appointment.  If you need a refill on your cardiac medications before your next appointment, please call your pharmacy.   Please call the office if you experience any passing out, light-headedness, and/or dizzy spells.

## 2016-11-29 DIAGNOSIS — H02423 Myogenic ptosis of bilateral eyelids: Secondary | ICD-10-CM | POA: Diagnosis not present

## 2016-11-29 DIAGNOSIS — H01024 Squamous blepharitis left upper eyelid: Secondary | ICD-10-CM | POA: Diagnosis not present

## 2016-11-29 DIAGNOSIS — H01025 Squamous blepharitis left lower eyelid: Secondary | ICD-10-CM | POA: Diagnosis not present

## 2016-11-29 DIAGNOSIS — H01022 Squamous blepharitis right lower eyelid: Secondary | ICD-10-CM | POA: Diagnosis not present

## 2016-11-29 DIAGNOSIS — H25813 Combined forms of age-related cataract, bilateral: Secondary | ICD-10-CM | POA: Diagnosis not present

## 2016-11-29 DIAGNOSIS — H01021 Squamous blepharitis right upper eyelid: Secondary | ICD-10-CM | POA: Diagnosis not present

## 2016-12-27 ENCOUNTER — Encounter: Payer: Self-pay | Admitting: Internal Medicine

## 2016-12-28 ENCOUNTER — Encounter (HOSPITAL_COMMUNITY): Payer: Self-pay | Admitting: *Deleted

## 2016-12-28 ENCOUNTER — Observation Stay (HOSPITAL_COMMUNITY)
Admission: EM | Admit: 2016-12-28 | Discharge: 2016-12-29 | Disposition: A | Discharge status: Home / Self Care | Payer: Medicare HMO | Attending: Internal Medicine | Admitting: Internal Medicine

## 2016-12-28 ENCOUNTER — Other Ambulatory Visit: Payer: Self-pay

## 2016-12-28 ENCOUNTER — Emergency Department (HOSPITAL_COMMUNITY): Payer: Medicare HMO

## 2016-12-28 DIAGNOSIS — G4733 Obstructive sleep apnea (adult) (pediatric): Secondary | ICD-10-CM | POA: Insufficient documentation

## 2016-12-28 DIAGNOSIS — G4730 Sleep apnea, unspecified: Secondary | ICD-10-CM | POA: Diagnosis present

## 2016-12-28 DIAGNOSIS — R079 Chest pain, unspecified: Secondary | ICD-10-CM | POA: Diagnosis not present

## 2016-12-28 DIAGNOSIS — Z79899 Other long term (current) drug therapy: Secondary | ICD-10-CM | POA: Insufficient documentation

## 2016-12-28 DIAGNOSIS — R072 Precordial pain: Secondary | ICD-10-CM

## 2016-12-28 DIAGNOSIS — Z7982 Long term (current) use of aspirin: Secondary | ICD-10-CM | POA: Insufficient documentation

## 2016-12-28 DIAGNOSIS — I441 Atrioventricular block, second degree: Secondary | ICD-10-CM | POA: Diagnosis not present

## 2016-12-28 DIAGNOSIS — Z9989 Dependence on other enabling machines and devices: Secondary | ICD-10-CM | POA: Diagnosis not present

## 2016-12-28 DIAGNOSIS — R001 Bradycardia, unspecified: Secondary | ICD-10-CM | POA: Diagnosis not present

## 2016-12-28 DIAGNOSIS — I10 Essential (primary) hypertension: Secondary | ICD-10-CM | POA: Diagnosis not present

## 2016-12-28 DIAGNOSIS — E785 Hyperlipidemia, unspecified: Secondary | ICD-10-CM | POA: Insufficient documentation

## 2016-12-28 LAB — CBC
HCT: 42.2 % (ref 39.0–52.0)
HEMOGLOBIN: 14.4 g/dL (ref 13.0–17.0)
MCH: 30.2 pg (ref 26.0–34.0)
MCHC: 34.1 g/dL (ref 30.0–36.0)
MCV: 88.5 fL (ref 78.0–100.0)
Platelets: 213 10*3/uL (ref 150–400)
RBC: 4.77 MIL/uL (ref 4.22–5.81)
RDW: 13.7 % (ref 11.5–15.5)
WBC: 7.6 10*3/uL (ref 4.0–10.5)

## 2016-12-28 LAB — BASIC METABOLIC PANEL
ANION GAP: 11 (ref 5–15)
BUN: 13 mg/dL (ref 6–20)
CALCIUM: 9.2 mg/dL (ref 8.9–10.3)
CO2: 23 mmol/L (ref 22–32)
Chloride: 103 mmol/L (ref 101–111)
Creatinine, Ser: 1.05 mg/dL (ref 0.61–1.24)
GFR calc non Af Amer: >60 mL/min (ref >60)
Glucose, Bld: 114 mg/dL — ABNORMAL HIGH (ref 65–99)
Potassium: 4.2 mmol/L (ref 3.5–5.1)
SODIUM: 137 mmol/L (ref 135–145)

## 2016-12-28 LAB — I-STAT TROPONIN, ED: TROPONIN I, POC: 0 ng/mL (ref 0.00–0.08)

## 2016-12-28 LAB — CREATININE, SERUM: Creatinine, Ser: 1.05 mg/dL (ref 0.61–1.24)

## 2016-12-28 LAB — TROPONIN I: Troponin I: <0.03 ng/mL (ref <0.03)

## 2016-12-28 MED ORDER — ACETAMINOPHEN 325 MG PO TABS: 650.0000 mg | ORAL_TABLET | ORAL | Status: DC | PRN

## 2016-12-28 MED ORDER — HYDRALAZINE HCL 20 MG/ML IJ SOLN
10.0000 mg | INTRAMUSCULAR | Status: DC | PRN
  Administered 2016-12-29: 10 mg via INTRAVENOUS
  Filled 2016-12-28: qty 1

## 2016-12-28 MED ORDER — ASPIRIN EC 325 MG PO TBEC
325.0000 mg | DELAYED_RELEASE_TABLET | Freq: Every day | ORAL | Status: DC
  Administered 2016-12-29: 325 mg via ORAL
  Filled 2016-12-28: qty 1

## 2016-12-28 MED ORDER — POTASSIUM CHLORIDE CRYS ER 20 MEQ PO TBCR
20.0000 meq | EXTENDED_RELEASE_TABLET | Freq: Every morning | ORAL | Status: DC
  Administered 2016-12-29: 20 meq via ORAL
  Filled 2016-12-28: qty 1

## 2016-12-28 MED ORDER — ONDANSETRON HCL 4 MG/2ML IJ SOLN: 4.0000 mg | Freq: Four times a day (QID) | INTRAMUSCULAR | Status: DC | PRN

## 2016-12-28 MED ORDER — NITROGLYCERIN 0.4 MG SL SUBL: 0.4000 mg | SUBLINGUAL_TABLET | SUBLINGUAL | Status: DC | PRN

## 2016-12-28 MED ORDER — LOSARTAN POTASSIUM 50 MG PO TABS
100.0000 mg | ORAL_TABLET | Freq: Every day | ORAL | Status: DC
  Administered 2016-12-29: 100 mg via ORAL
  Filled 2016-12-28: qty 2

## 2016-12-28 MED ORDER — ATORVASTATIN CALCIUM 20 MG PO TABS
20.0000 mg | ORAL_TABLET | Freq: Every morning | ORAL | Status: DC
  Administered 2016-12-29: 20 mg via ORAL
  Filled 2016-12-28: qty 1

## 2016-12-28 MED ORDER — ASPIRIN 81 MG PO CHEW
243.0000 mg | CHEWABLE_TABLET | Freq: Once | ORAL | Status: AC
  Administered 2016-12-28: 243 mg via ORAL
  Filled 2016-12-28: qty 3

## 2016-12-28 MED ORDER — ENOXAPARIN SODIUM 40 MG/0.4ML ~~LOC~~ SOLN
40.0000 mg | Freq: Every day | SUBCUTANEOUS | Status: DC
  Administered 2016-12-29: 40 mg via SUBCUTANEOUS
  Filled 2016-12-28: qty 0.4

## 2016-12-28 NOTE — ED Triage Notes (Signed)
Pt reports onset today of mid chest tightness that radiates into his neck. Has mild sob with exertion. Reports recent wearing home cardiac monitor and was being told he needs a pacemaker due to episode of bradycardia. No acute distress is noted at triage.

## 2016-12-28 NOTE — ED Notes (Signed)
Patient denies pain and is resting comfortably.  

## 2016-12-28 NOTE — H&P (Signed)
History and Physical    [name] UUE:[id number] DOB: 09/11/1940 DOA: 12/28/2016  PCP: Merrilee Seashore, MD  Patient coming from: Home.  Chief Complaint: Chest pain.  HPI: [name] is a 76 y.o. male with history of hypertension, hyperlipidemia, sleep apnea and history of second-degree AV block started experiencing chest pain this morning after breakfast.  Pain was retrosternal nonradiating persistent with no associated shortness of breath palpitation diaphoresis or nausea.  Patient chest pain eventually gradually resolved without any intervention.  Patient came to the ER for further management.  ED Course: In the ER patient was chest pain-free.  Chest x-ray and EKG and troponin were unremarkable.  Patient admitted for further management.  Review of Systems: As per HPI, rest all negative.   Past Medical History:  Diagnosis Date  . Arthritis   . Gout   . Hypertension   . OSA (obstructive sleep apnea)    per pt noncompliant CPAP has not use it since 2015  . Wears dentures    upper    Past Surgical History:  Procedure Laterality Date  . ELBOW BURSA SURGERY Left 07-21-2009   extensive bursectomy/ tenosynovectomy/ ulnar nerve release  . HAMMER TOE SURGERY  2015   right foot  . HAMMER TOE SURGERY Right 12/11/2014   Procedure: 4th HAMMER TOE CORRECTION, EXCISION LESION 3rd TOE RIGHT FOOT;  Surgeon: Rosemary Holms, DPM;  Location: [facility];  Service: Podiatry;  Laterality: Right;  . MICROLARYNGOSCOPY  06-07-2006   w/  Excision bilateral vocal cord lesions (bilateral benign nodules)     reports that he has never smoked. He has never used smokeless tobacco. He reports that he drinks alcohol. He reports that he does not use drugs.  No Known Allergies  Family History  Problem Relation Age of Onset  . Stroke Mother 34       had gangrene of toe, then later "stroke" per patient  . Osteoarthritis Father   . Heart attack Father 38  . Heart attack  Sister        sudden cardiac arrest  . Colon cancer Neg Hx     Prior to Admission medications   Medication Sig Start Date End Date Taking? Authorizing Provider  aspirin EC 81 MG tablet Take 81 mg by mouth daily.     Yes [provider]  atorvastatin (LIPITOR) 20 MG tablet Take 20 mg by mouth every morning.   Yes [provider]  losartan (COZAAR) 100 MG tablet Take 100 mg by mouth daily.   Yes [provider]  Multiple Vitamins-Minerals (MULTIVITAMINS THER. W/MINERALS) TABS Take 1 tablet by mouth daily.     Yes [provider]  potassium chloride SA (K-DUR,KLOR-CON) 20 MEQ tablet Take 20 mEq by mouth every morning.    Yes [provider]    Physical Exam: Vitals:   12/28/16 1500 12/28/16 1838 12/28/16 1915 12/28/16 1930  BP: (!) 143/103 (!) 160/85 (!) 179/78 (!) 166/82  Pulse: 74 60 64 63  Resp: 18 12 16 15   Temp: 98.8 F (37.1 C) 98.4 F (36.9 C)    TempSrc: Oral Oral    SpO2: 99% 96% 100% 100%  Weight: 106.6 kg (235 lb)     Height: 5\' 11"  (1.803 m)         Constitutional: Moderately built and nourished. Vitals:   12/28/16 1500 12/28/16 1838 12/28/16 1915 12/28/16 1930  BP: (!) 143/103 (!) 160/85 (!) 179/78 (!) 166/82  Pulse: 74 60 64 63  Resp: 18 12 16 15   Temp: 98.8 F (37.1 C) 98.4 F (36.9 C)    TempSrc: Oral Oral    SpO2: 99% 96% 100% 100%  Weight: 106.6 kg (235 lb)     Height: 5\' 11"  (1.803 m)      Eyes: Anicteric no pallor. ENMT: No discharge from the ears eyes nose or mouth. Neck: No mass felt.  No neck rigidity.  No JVD appreciated. Respiratory: No rhonchi or crepitations. Cardiovascular: S1-S2 heard no murmurs appreciated. Abdomen: Soft nontender bowel sounds present. Musculoskeletal: No edema.  No joint effusion. Skin: No rash.  Skin appears warm. Neurologic: Alert awake oriented to time place and person.  Moves all extremities. Psychiatric: Appears normal.  Normal affect.   Labs on Admission: I have  personally reviewed following labs and imaging studies  CBC:  Recent Labs Lab 12/28/16 1450  WBC 7.6  HGB 14.4  HCT 42.2  MCV 88.5  PLT 546   Basic Metabolic Panel:  Recent Labs Lab 12/28/16 1450  NA 137  K 4.2  CL 103  CO2 23  GLUCOSE 114*  BUN 13  CREATININE 1.05  CALCIUM 9.2   GFR: Estimated Creatinine Clearance: 75.5 mL/min (by C-G formula based on SCr of 1.05 mg/dL). Liver Function Tests: No results for input(s): AST, ALT, ALKPHOS, BILITOT, PROT, ALBUMIN in the last 168 hours. No results for input(s): LIPASE, AMYLASE in the last 168 hours. No results for input(s): AMMONIA in the last 168 hours. Coagulation Profile: No results for input(s): INR, PROTIME in the last 168 hours. Cardiac Enzymes: No results for input(s): CKTOTAL, CKMB, CKMBINDEX, TROPONINI in the last 168 hours. BNP (last 3 results) No results for input(s): PROBNP in the last 8760 hours. HbA1C: No results for input(s): HGBA1C in the last 72 hours. CBG: No results for input(s): GLUCAP in the last 168 hours. Lipid Profile: No results for input(s): CHOL, HDL, LDLCALC, TRIG, CHOLHDL, LDLDIRECT in the last 72 hours. Thyroid Function Tests: No results for input(s): TSH, T4TOTAL, FREET4, T3FREE, THYROIDAB in the last 72 hours. Anemia Panel: No results for input(s): VITAMINB12, FOLATE, FERRITIN, TIBC, IRON, RETICCTPCT in the last 72 hours. Urine analysis:    Component Value Date/Time   COLORURINE YELLOW 02/15/2015 0430   APPEARANCEUR CLOUDY (A) 02/15/2015 0430   LABSPEC 1.030 02/15/2015 0430   PHURINE 6.0 02/15/2015 0430   GLUCOSEU NEGATIVE 02/15/2015 0430   HGBUR TRACE (A) 02/15/2015 0430   BILIRUBINUR NEGATIVE 02/15/2015 0430   KETONESUR NEGATIVE 02/15/2015 0430   PROTEINUR NEGATIVE 02/15/2015 0430   UROBILINOGEN 0.2 01/21/2011 0839   NITRITE NEGATIVE 02/15/2015 0430   LEUKOCYTESUR NEGATIVE 02/15/2015 0430   Sepsis Labs: @LABRCNTIP (procalcitonin:4,lacticidven:4) )No results found for this  or any previous visit (from the past 240 hour(s)).   Radiological Exams on Admission: Dg Chest 2 View  Result Date: 12/28/2016 CLINICAL DATA:  Chest pain.  Up and down blood pressure. EXAM: CHEST  2 VIEW COMPARISON:  01/21/2011 FINDINGS: Borderline heart size. Stable mild aortic tortuosity. Negative hila. There is no edema, consolidation, effusion, or pneumothorax. Spondylosis.  No acute osseous finding. IMPRESSION: No evidence of active disease. Electronically Signed   By: Monte Fantasia M.D.   On: 12/28/2016 16:04    EKG: Independently reviewed.  Normal sinus rhythm without bruit.  Assessment/Plan Principal Problem:   Chest pain Active Problems:   Sleep apnea   Essential hypertension    1. Chest pain -presently chest pain-free.  We will cycle cardiac markers.  Check 2D echo.  Requested cardiology  consult. 2. Hypertension on Cozaar. 3. Hyperlipidemia on atorvastatin. 4. Sleep apnea on CPAP. 5. History of second-degree AV block with syncope episodes.  I have reviewed patient's old charts and labs.   DVT prophylaxis: Lovenox. Code Status: Full code. Family Communication: Discussed with patient. Disposition Plan: Home. Consults called: Cardiology. Admission status: Observation.   Rise Patience MD [facility] Pager [phone].  If 7PM-7AM, please contact night-coverage [email or web address] Password [email or web address]  12/28/2016, 8:11 PM

## 2016-12-28 NOTE — ED Notes (Addendum)
Attempted report. RN unable to take it. States that she will call back. Will call her back in 5 minutes.

## 2016-12-28 NOTE — ED Provider Notes (Signed)
Emergency Department Provider Note   I have reviewed the triage vital signs and the nursing notes.   HISTORY  Chief Complaint Hypertension; Chest Pain; and Shortness of Breath   HPI [name] is a 76 y.o. male with PMH of HTN, obesity, and OSA presents to the emergency department for evaluation of intermittent left chest tightness radiating to the neck.  The patient reports intermittent symptoms throughout the day worse with movement and relieved by rest.  He has had some rest symptoms today as well.  He currently has no chest pain.  He had some mild dyspnea noticed his blood pressures were very high today.  He takes his blood pressure every morning and typically runs in the 503T systolic but today was approaching 200.  No recent medication changes.  No history of similar chest pain.  Patient with no recent stress testing or cardiac catheterization.  He is followed by cardiology for bradycardia with plan to implant a pacemaker.    Past Medical History:  Diagnosis Date  . Arthritis   . Gout   . Hypertension   . OSA (obstructive sleep apnea)    per pt noncompliant CPAP has not use it since 2015  . Wears dentures    upper    Patient Active Problem List   Diagnosis Date Noted  . Chest pain 12/28/2016  . Essential hypertension 12/28/2016  . BRBPR (bright red blood per rectum) 11/04/2010  . Mild renal insufficiency 11/04/2010  . Sleep apnea 11/04/2010    Past Surgical History:  Procedure Laterality Date  . ELBOW BURSA SURGERY Left 07-21-2009   extensive bursectomy/ tenosynovectomy/ ulnar nerve release  . HAMMER TOE SURGERY  2015   right foot  . HAMMER TOE SURGERY Right 12/11/2014   Procedure: 4th HAMMER TOE CORRECTION, EXCISION LESION 3rd TOE RIGHT FOOT;  Surgeon: Rosemary Holms, DPM;  Location: [facility];  Service: Podiatry;  Laterality: Right;  . MICROLARYNGOSCOPY  06-07-2006   w/  Excision bilateral vocal cord lesions (bilateral benign nodules)       Allergies Patient has no known allergies.  Family History  Problem Relation Age of Onset  . Stroke Mother 53       had gangrene of toe, then later "stroke" per patient  . Osteoarthritis Father   . Heart attack Father 92  . Heart attack Sister        sudden cardiac arrest  . Colon cancer Neg Hx     Social History Social History  Substance Use Topics  . Smoking status: Never Smoker  . Smokeless tobacco: Never Used  . Alcohol use Yes     Comment: OCCASIONAL    Review of Systems  Constitutional: No fever/chills Eyes: No visual changes. ENT: No sore throat. Cardiovascular: Positive chest pain. Respiratory: Positive mild shortness of breath. Gastrointestinal: No abdominal pain.  No nausea, no vomiting.  No diarrhea.  No constipation. Genitourinary: Negative for dysuria. Musculoskeletal: Negative for back pain. Skin: Negative for rash. Neurological: Negative for headaches, focal weakness or numbness.  10-point ROS otherwise negative.  ____________________________________________   PHYSICAL EXAM:  VITAL SIGNS: ED Triage Vitals  Enc Vitals Group     BP 12/28/16 1500 (!) 143/103     Pulse Rate 12/28/16 1500 74     Resp 12/28/16 1500 18     Temp 12/28/16 1500 98.8 F (37.1 C)     Temp Source 12/28/16 1500 Oral     SpO2 12/28/16 1500 99 %  Weight 12/28/16 1500 235 lb (106.6 kg)     Height 12/28/16 1500 5\' 11"  (1.803 m)     Pain Score 12/28/16 1514 5   Constitutional: Alert and oriented. Well appearing and in no acute distress. Eyes: Conjunctivae are normal.  Head: Atraumatic. Nose: No congestion/rhinnorhea. Mouth/Throat: Mucous membranes are moist.  Neck: No stridor.  Cardiovascular: Normal rate, regular rhythm. Good peripheral circulation. Grossly normal heart sounds.   Respiratory: Normal respiratory effort.  No retractions. Lungs CTAB. Gastrointestinal: Soft and nontender. No distention.  Musculoskeletal: No lower extremity tenderness nor edema.  No gross deformities of extremities. Neurologic:  Normal speech and language. No gross focal neurologic deficits are appreciated.  Skin:  Skin is warm, dry and intact. No rash noted.  ____________________________________________   LABS (all labs ordered are listed, but only abnormal results are displayed)  Labs Reviewed  BASIC METABOLIC PANEL - Abnormal; Notable for the following:       Result Value   Glucose, Bld 114 (*)    All other components within normal limits  CBC  TROPONIN I  CREATININE, SERUM  TROPONIN I  TROPONIN I  CBC  I-STAT TROPONIN, ED   ____________________________________________  EKG   EKG Interpretation  Date/Time:  Wednesday December 28 2016 14:39:21 EDT Ventricular Rate:  75 PR Interval:  190 QRS Duration: 142 QT Interval:  410 QTC Calculation: 457 R Axis:   154 Text Interpretation:  Sinus rhythm with Blocked Premature atrial complexes Right bundle branch block Abnormal ECG No STEMI. Similar to 2016 tracing.  Confirmed by Nanda Quinton [phone]) on 12/28/2016 6:38:42 PM       ____________________________________________  RADIOLOGY  Dg Chest 2 View  Result Date: 12/28/2016 CLINICAL DATA:  Chest pain.  Up and down blood pressure. EXAM: CHEST  2 VIEW COMPARISON:  01/21/2011 FINDINGS: Borderline heart size. Stable mild aortic tortuosity. Negative hila. There is no edema, consolidation, effusion, or pneumothorax. Spondylosis.  No acute osseous finding. IMPRESSION: No evidence of active disease. Electronically Signed   By: Monte Fantasia M.D.   On: 12/28/2016 16:04    ____________________________________________   PROCEDURES  Procedure(s) performed:   Procedures  None ____________________________________________   INITIAL IMPRESSION / ASSESSMENT AND PLAN / ED COURSE  Pertinent labs & imaging results that were available during my care of the patient were reviewed by me and considered in my medical decision making (see chart for  details).  Patient presents to the emergency department for evaluation of chest discomfort today along with elevated blood pressures in my initial troponin is negative.  Plan to repeat and give ASA.  No history of CAD or recent chest pain evaluation. HEART score of 6. Plan for admission for CP evaluation.   Discussed patient's case with Hospitalist, Dr. Hal Hope to request admission. Patient and family (if present) updated with plan. Care transferred to Hospitalist service.  I reviewed all nursing notes, vitals, pertinent old records, EKGs, labs, imaging (as available).  ____________________________________________  FINAL CLINICAL IMPRESSION(S) / ED DIAGNOSES  Final diagnoses:  Precordial chest pain     MEDICATIONS GIVEN DURING THIS VISIT:  Medications  losartan (COZAAR) tablet 100 mg (not administered)  atorvastatin (LIPITOR) tablet 20 mg (not administered)  potassium chloride SA (K-DUR,KLOR-CON) CR tablet 20 mEq (not administered)  acetaminophen (TYLENOL) tablet 650 mg (not administered)  ondansetron (ZOFRAN) injection 4 mg (not administered)  enoxaparin (LOVENOX) injection 40 mg (not administered)  aspirin EC tablet 325 mg (325 mg Oral Not Given 12/28/16 2322)  nitroGLYCERIN (NITROSTAT) SL tablet 0.4  mg (not administered)  hydrALAZINE (APRESOLINE) injection 10 mg (not administered)  aspirin chewable tablet 243 mg (243 mg Oral Given 12/28/16 1953)     NEW OUTPATIENT MEDICATIONS STARTED DURING THIS VISIT:  None  Note:  This document was prepared using Dragon voice recognition software and may include unintentional dictation errors.  Nanda Quinton, MD Emergency Medicine   Twala Collings, [facility], MD 12/28/16 [phone]

## 2016-12-28 NOTE — Progress Notes (Signed)
Patient received to unit. Patient awake , alert and oriented x4. Denies any chest pain and SOB of breath at this time. Patient oriented to room , bed alarm activated and patient informed to call for help before getting out of bed. Plan of care reviewed with patient and patient verbalizes understanding.

## 2016-12-29 ENCOUNTER — Ambulatory Visit (HOSPITAL_BASED_OUTPATIENT_CLINIC_OR_DEPARTMENT_OTHER): Payer: Medicare HMO

## 2016-12-29 ENCOUNTER — Observation Stay (HOSPITAL_BASED_OUTPATIENT_CLINIC_OR_DEPARTMENT_OTHER): Payer: Medicare HMO

## 2016-12-29 DIAGNOSIS — I471 Supraventricular tachycardia: Secondary | ICD-10-CM | POA: Diagnosis not present

## 2016-12-29 DIAGNOSIS — R0789 Other chest pain: Secondary | ICD-10-CM | POA: Diagnosis not present

## 2016-12-29 DIAGNOSIS — I10 Essential (primary) hypertension: Secondary | ICD-10-CM

## 2016-12-29 DIAGNOSIS — R079 Chest pain, unspecified: Secondary | ICD-10-CM | POA: Diagnosis not present

## 2016-12-29 DIAGNOSIS — G4733 Obstructive sleep apnea (adult) (pediatric): Secondary | ICD-10-CM

## 2016-12-29 DIAGNOSIS — I441 Atrioventricular block, second degree: Secondary | ICD-10-CM

## 2016-12-29 LAB — NM MYOCAR MULTI W/SPECT W/WALL MOTION / EF
CSEPEW: 1 METS
CSEPHR: 59 %
CSEPPHR: 86 {beats}/min
Exercise duration (min): 5 min
MPHR: 145 {beats}/min
Rest HR: 62 {beats}/min

## 2016-12-29 LAB — TROPONIN I
Troponin I: <0.03 ng/mL (ref <0.03)
Troponin I: <0.03 ng/mL (ref <0.03)

## 2016-12-29 LAB — ECHOCARDIOGRAM COMPLETE
Height: 70 in
WEIGHTICAEL: 3604.8 [oz_av]

## 2016-12-29 MED ORDER — REGADENOSON 0.4 MG/5ML IV SOLN
INTRAVENOUS | Status: AC
  Filled 2016-12-29: qty 5

## 2016-12-29 MED ORDER — TECHNETIUM TC 99M TETROFOSMIN IV KIT
10.0000 | PACK | Freq: Once | INTRAVENOUS | Status: AC | PRN
  Administered 2016-12-29: 10 via INTRAVENOUS

## 2016-12-29 MED ORDER — REGADENOSON 0.4 MG/5ML IV SOLN
0.4000 mg | Freq: Once | INTRAVENOUS | Status: AC
  Administered 2016-12-29: 0.4 mg via INTRAVENOUS
  Filled 2016-12-29: qty 5

## 2016-12-29 MED ORDER — NITROGLYCERIN 0.4 MG SL SUBL: 0.4000 mg | SUBLINGUAL_TABLET | SUBLINGUAL | 0 refills | Status: DC | PRN

## 2016-12-29 MED ORDER — TECHNETIUM TC 99M TETROFOSMIN IV KIT
30.0000 | PACK | Freq: Once | INTRAVENOUS | Status: AC | PRN
  Administered 2016-12-29: 30 via INTRAVENOUS

## 2016-12-29 NOTE — Discharge Summary (Signed)
Physician Discharge Summary  [name] DVV:[id number] DOB: 1940/06/30 DOA: 12/28/2016  PCP: Merrilee Seashore, MD  Admit date: 12/28/2016 Discharge date: 12/29/2016  Admitted From: Home Disposition: Home  Recommendations for Outpatient Follow-up:  1. Follow up with PCP in 1-2 weeks 2. Follow up with Cardiology/Dr. Caryl Comes as scheduled 3. Follow up in the ED if symptoms worsen or new appear   Home Health:No Equipment/Devices: None  Discharge Condition: Stable  CODE STATUS: Full Diet recommendation: Heart Healthy  Brief/Interim Summary: 76 y.o. male with history of hypertension, hyperlipidemia, sleep apnea and history of second-degree AV block presented with chest pain. Cardiology was consulted. Nuclear stress test was low risk for ischemia and EF was normal in 2D echo. Cardiology has cleared the patient for discharge. Currently chest pain free.  Discharge Diagnoses:  Principal Problem:   Chest pain Active Problems:   Sleep apnea   Essential hypertension  1. Chest pain - presently chest pain-free.  Cardiology evaluated the patient and  nuclear stress test was low risk for ischemia. Troponins negative. Echo showed EF of 50-55%. Cardiology has cleared the patient for discharge. Outpatient follow up with Dr. Klein/Cardiology as scheduled 2. Hypertension: blood pressure better; continue Cozaar. Outpatient follow up. 3. Hyperlipidemia: continue atorvastatin. 4. Sleep apnea on CPAP. 5. History of second-degree AV block with syncope episodes: outpatient follow up with Dr. Caryl Comes  Discharge Instructions  Discharge Instructions    Call MD for:  difficulty breathing, headache or visual disturbances    Complete by:  As directed    Call MD for:  extreme fatigue    Complete by:  As directed    Call MD for:  hives    Complete by:  As directed    Call MD for:  persistant dizziness or light-headedness    Complete by:  As directed    Call MD for:  persistant nausea and vomiting     Complete by:  As directed    Call MD for:  severe uncontrolled pain    Complete by:  As directed    Call MD for:  temperature >100.4    Complete by:  As directed    Diet - low sodium heart healthy    Complete by:  As directed    Increase activity slowly    Complete by:  As directed      Allergies as of 12/29/2016   No Known Allergies     Medication List    TAKE these medications   aspirin EC 81 MG tablet Take 81 mg by mouth daily.   atorvastatin 20 MG tablet Commonly known as:  LIPITOR Take 20 mg by mouth every morning.   losartan 100 MG tablet Commonly known as:  COZAAR Take 100 mg by mouth daily.   multivitamins ther. w/minerals Tabs tablet Take 1 tablet by mouth daily.   nitroGLYCERIN 0.4 MG SL tablet Commonly known as:  NITROSTAT Place 1 tablet (0.4 mg total) under the tongue every 5 (five) minutes as needed for chest pain.   potassium chloride SA 20 MEQ tablet Commonly known as:  K-DUR,KLOR-CON Take 20 mEq by mouth every morning.      Follow-up Information    Deboraha Sprang, MD Follow up on 01/12/2017.   Specialty:  Cardiology Why:  3:30 PM to discuss pacemaker  Contact information: [address] [address] [phone]        Merrilee Seashore, MD. Schedule an appointment as soon as possible for a visit in 1  week(s).   Specialty:  Internal Medicine Contact information: [address] [place] [address] [phone]          No Known Allergies  Consultations:  Cardiology   Procedures/Studies: Dg Chest 2 View  Result Date: 12/28/2016 CLINICAL DATA:  Chest pain.  Up and down blood pressure. EXAM: CHEST  2 VIEW COMPARISON:  01/21/2011 FINDINGS: Borderline heart size. Stable mild aortic tortuosity. Negative hila. There is no edema, consolidation, effusion, or pneumothorax. Spondylosis.  No acute osseous finding. IMPRESSION: No evidence of active disease. Electronically Signed   By: Monte Fantasia M.D.   On: 12/28/2016 16:04   Nm Myocar Multi W/spect W/wall Motion / Ef  Result Date: 12/29/2016 CLINICAL DATA:  Chest pain.  Hypertension. EXAM: MYOCARDIAL IMAGING WITH SPECT (REST AND PHARMACOLOGIC-STRESS) GATED LEFT VENTRICULAR WALL MOTION STUDY LEFT VENTRICULAR EJECTION FRACTION TECHNIQUE: Standard myocardial SPECT imaging was performed after resting intravenous injection of 10 mCi Tc-78m tetrofosmin. Subsequently, intravenous infusion of Lexiscan was performed under the supervision of the Cardiology staff. At peak effect of the drug, 30 mCi Tc-82m tetrofosmin was injected intravenously and standard myocardial SPECT imaging was performed. Quantitative gated imaging was also performed to evaluate left ventricular wall motion, and estimate left ventricular ejection fraction. COMPARISON:  Chest radiograph 12/28/2016 FINDINGS: Perfusion: Reduced activity in the cardiac apex on stress and rest images, specially along the lateral margin, compatible with apical thinning or apical scar. There is also inferior wall reduced activity on stress and rest images, suggesting mild severity inferior wall scarring. Wall Motion: Mild inferolateral hypokinesis. Left Ventricular Ejection Fraction: 47 % End diastolic volume 678 ml End systolic volume 70 ml (mildly abnormally elevated) IMPRESSION: 1. Scarring in the inferior wall ; apical thinning versus scarring in the cardiac apex. 2. Mild inferior wall hypokinesis. 3. Left ventricular ejection fraction 47% 4. Non invasive risk stratification*: Intermediate *2012 Appropriate Use Criteria for Coronary Revascularization Focused Update: J Am Coll Cardiol. 9381;01(7):510-258. http://[email or web address].aspx?[email or web address] Electronically Signed   By: Van Clines M.D.   On: 12/29/2016 16:03    Echo on 12/29/16 Study Conclusions  - Left ventricle: The cavity size was normal. Wall thickness was   increased in a pattern of mild LVH. Systolic function  was normal.   The estimated ejection fraction was in the range of 50% to 55%.   Wall motion was normal; there were no regional wall motion   abnormalities. Doppler parameters are consistent with abnormal   left ventricular relaxation (grade 1 diastolic dysfunction).   Doppler parameters are consistent with high ventricular filling   pressure. - Mitral valve: Calcified annulus.  Impressions:  - Normal LV systolic function; mild LVH; mild diastolic   dysfunction.  Subjective: Patient seen and examined at bedside. He denies current chest pain, nausea or vomiting  Discharge Exam: Vitals:   12/29/16 1250 12/29/16 1400  BP:  (!) 165/78  Pulse: 76 73  Resp:  14  Temp:  (!) 97.3 F (36.3 C)  SpO2:  99%   Vitals:   12/29/16 1248 12/29/16 1249 12/29/16 1250 12/29/16 1400  BP:  (!) 148/59  (!) 165/78  Pulse: 83 78 76 73  Resp:    14  Temp:    (!) 97.3 F (36.3 C)  TempSrc:    Oral  SpO2:    99%  Weight:      Height:        General exam: Appears calm and comfortable  Respiratory system: Bilateral decreased breath sound at bases Cardiovascular system: S1 &  S2 heard, rate controlled Gastrointestinal system: Abdomen is nondistended, soft and nontender. Normal bowel sounds heard. Extremities: No cyanosis, clubbing, edema     The results of significant diagnostics from this hospitalization (including imaging, microbiology, ancillary and laboratory) are listed below for reference.     Microbiology: No results found for this or any previous visit (from the past 240 hour(s)).   Labs: BNP (last 3 results) No results for input(s): BNP in the last 8760 hours. Basic Metabolic Panel:  Recent Labs Lab 12/28/16 1450 12/28/16 2013  NA 137  --   K 4.2  --   CL 103  --   CO2 23  --   GLUCOSE 114*  --   BUN 13  --   CREATININE 1.05 1.05  CALCIUM 9.2  --    Liver Function Tests: No results for input(s): AST, ALT, ALKPHOS, BILITOT, PROT, ALBUMIN in the last 168 hours. No  results for input(s): LIPASE, AMYLASE in the last 168 hours. No results for input(s): AMMONIA in the last 168 hours. CBC:  Recent Labs Lab 12/28/16 1450  WBC 7.6  HGB 14.4  HCT 42.2  MCV 88.5  PLT 213   Cardiac Enzymes:  Recent Labs Lab 12/28/16 2013 12/29/16 0211 12/29/16 0614  TROPONINI <0.03 <0.03 <0.03   BNP: Invalid input(s): POCBNP CBG: No results for input(s): GLUCAP in the last 168 hours. D-Dimer No results for input(s): DDIMER in the last 72 hours. Hgb A1c No results for input(s): HGBA1C in the last 72 hours. Lipid Profile No results for input(s): CHOL, HDL, LDLCALC, TRIG, CHOLHDL, LDLDIRECT in the last 72 hours. Thyroid function studies No results for input(s): TSH, T4TOTAL, T3FREE, THYROIDAB in the last 72 hours.  Invalid input(s): FREET3 Anemia work up No results for input(s): VITAMINB12, FOLATE, FERRITIN, TIBC, IRON, RETICCTPCT in the last 72 hours. Urinalysis    Component Value Date/Time   COLORURINE YELLOW 02/15/2015 0430   APPEARANCEUR CLOUDY (A) 02/15/2015 0430   LABSPEC 1.030 02/15/2015 0430   PHURINE 6.0 02/15/2015 0430   GLUCOSEU NEGATIVE 02/15/2015 0430   HGBUR TRACE (A) 02/15/2015 0430   BILIRUBINUR NEGATIVE 02/15/2015 0430   KETONESUR NEGATIVE 02/15/2015 0430   PROTEINUR NEGATIVE 02/15/2015 0430   UROBILINOGEN 0.2 01/21/2011 0839   NITRITE NEGATIVE 02/15/2015 0430   LEUKOCYTESUR NEGATIVE 02/15/2015 0430   Sepsis Labs Invalid input(s): PROCALCITONIN,  WBC,  LACTICIDVEN Microbiology No results found for this or any previous visit (from the past 240 hour(s)).   Time coordinating discharge: 30 minutes  SIGNED:   [name], MD  [facility] 12/29/2016, 5:08 PM Pager: [phone]  If 7PM-7AM, please contact night-coverage [email or web address] Password [email or web address]

## 2016-12-29 NOTE — Progress Notes (Signed)
Echocardiogram 2D Echocardiogram has been performed.  [name] 12/29/2016, 11:23 AM

## 2016-12-29 NOTE — Progress Notes (Signed)
   NST completed. Radiologist interpretation pending.   [name]

## 2016-12-29 NOTE — Progress Notes (Signed)
Pt IV discontinued, catheter intact and telemetry removed. Pt has all belongings packed. Pt discharge education provided at bedside with pt and pt family  Pt discharged via wheelchair with nurse staff

## 2016-12-29 NOTE — Progress Notes (Signed)
Talked to cardiology PA  Stated okay to place pt on diet

## 2016-12-29 NOTE — Consult Note (Signed)
Cardiology Consultation:   Patient ID: [name]; [id number]; 12/05/1940   Admit date: 12/28/2016 Date of Consult: 12/29/2016  Primary Care Provider: Merrilee Seashore, MD Primary Cardiologist: [name]   Patient Profile:   [name] is a 76 y.o. male with a hx of marked bradycardia, 2nd degree AV block, HTN, HLD, RBBB and OSA, who is being seen today for the evaluation of chest pain at the request of [name], Internal Medicine.  History of Present Illness:   Mr. [name] is followed by [name]. He has recently been evaluated for near syncope. Carotid dopplers 09/2016 showed < 50% disease bilaterally. 2D echo 11/2016 showed normal LVEF at 50-55%, normal wall motion and no valvular disease. 24 hr Cardiac monitor 11/2016 showed 1st and 2nd degree AVB, marked bradycardia with lowest HR at 28 bpm, RBBB w/ significant QRS delay as well as AV dissasociation. PVCs and PACs also noted. Most of his bradycardia was nocturnal, however still with significant daytime bradycardia. Referral to EP placed for consideration for PPM. His first visit is 01/12/17 with Dr. Caryl [name]. He has no documented h/o CAD. No previous stress test. Risk factors include HTN. Pt notes he is very active. 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 He denies any frank syncope.   He presented to the [facility] ED yesterday with complaint of chest pain. He was in his usual state of health until yesterday. He checks his BP daily and yesterday it was marked elevated in the 505L systolic and 976B diastolic. He reports full med compliance and denies any recent increased sodium intake, but admits that he has been stressed recently given need for PPM. He started to worry about his BP and his upper chest, anterior throat started to get "tight". He denies any associated dyspnea. Given his elevated BP and symptoms he came to the ED. He has been admitted by IM.    EKG shows chronic RBBB and  PACs. He has had some atrial tach on telemetry. Troponins are negative x 3. CBC and BMP unremarkable. CXR is negative. He denies any recurrent CP. BP remains moderately elevated but improved since yesterday.     Past Medical History:  Diagnosis Date  . Arthritis   . Gout   . Hypertension   . OSA (obstructive sleep apnea)    per pt noncompliant CPAP has not use it since 2015  . Wears dentures    upper    Past Surgical History:  Procedure Laterality Date  . ELBOW BURSA SURGERY Left 07-21-2009   extensive bursectomy/ tenosynovectomy/ ulnar nerve release  . HAMMER TOE SURGERY  2015   right foot  . HAMMER TOE SURGERY Right 12/11/2014   Procedure: 4th HAMMER TOE CORRECTION, EXCISION LESION 3rd TOE RIGHT FOOT;  Surgeon: [name], DPM;  Location: [place];  Service: Podiatry;  Laterality: Right;  . MICROLARYNGOSCOPY  06-07-2006   w/  Excision bilateral vocal cord lesions (bilateral benign nodules)     Home Medications:  Prior to Admission medications   Medication Sig Start Date End Date Taking? Authorizing Provider  aspirin EC 81 MG tablet Take 81 mg by mouth daily.     Yes [provider]  atorvastatin (LIPITOR) 20 MG tablet Take 20 mg by mouth every morning.   Yes [provider]  losartan (COZAAR) 100 MG tablet Take 100 mg by mouth daily.   Yes [provider]  Multiple Vitamins-Minerals (MULTIVITAMINS THER. W/MINERALS) TABS  Take 1 tablet by mouth daily.     Yes [provider]  potassium chloride SA (K-DUR,KLOR-CON) 20 MEQ tablet Take 20 mEq by mouth every morning.    Yes [provider]    Inpatient Medications: Scheduled Meds: . aspirin EC  325 mg Oral Daily  . atorvastatin  20 mg Oral q morning - 10a  . enoxaparin (LOVENOX) injection  40 mg Subcutaneous QHS  . losartan  100 mg Oral Daily  . potassium chloride SA  20 mEq Oral q morning - 10a   Continuous Infusions:  PRN Meds: acetaminophen, hydrALAZINE,  nitroGLYCERIN, ondansetron (ZOFRAN) IV  Allergies:   No Known Allergies  Social History:   Social History   Social History  . Marital status: Married    Spouse name: N/A  . Number of children: N/A  . Years of education: N/A   Occupational History  . Not on file.   Social History Main Topics  . Smoking status: Never Smoker  . Smokeless tobacco: Never Used  . Alcohol use Yes     Comment: OCCASIONAL  . Drug use: No  . Sexual activity: Not on file   Other Topics Concern  . Not on file   Social History Narrative  . No narrative on file    Family History:    Family History  Problem Relation Age of Onset  . Stroke Mother 1       had gangrene of toe, then later "stroke" per patient  . Osteoarthritis Father   . Heart attack Father 53  . Heart attack Sister        sudden cardiac arrest  . Colon cancer Neg Hx      ROS:  Please see the history of present illness.  ROS  All other ROS reviewed and negative.     Physical Exam/Data:   Vitals:   12/28/16 2325 12/29/16 0001 12/29/16 0600 12/29/16 0700  BP: (!) 181/76 (!) 164/73 129/63 132/72  Pulse: 64  63 68  Resp: 18  18 14   Temp: 97.9 F (36.6 C)  98 F (36.7 C) (!) 97.5 F (36.4 C)  TempSrc: Oral  Oral Oral  SpO2: 99%  98% 99%  Weight: 228 lb 9.6 oz (103.7 kg)  225 lb 4.8 oz (102.2 kg)   Height: 5\' 10"  (1.778 m)       Intake/Output Summary (Last 24 hours) at 12/29/16 0942 Last data filed at 12/29/16 0916  Gross per 24 hour  Intake                0 ml  Output                0 ml  Net                0 ml   Filed Weights   12/28/16 1500 12/28/16 2325 12/29/16 0600  Weight: 235 lb (106.6 kg) 228 lb 9.6 oz (103.7 kg) 225 lb 4.8 oz (102.2 kg)   Body mass index is 32.33 kg/m.  General:  Well nourished, well developed, in no acute distress HEENT: normal Lymph: no adenopathy Neck: no JVD Endocrine:  No thryomegaly Vascular: No carotid bruits; FA pulses 2+ bilaterally without bruits  Cardiac:  normal S1,  S2; RRR; no murmur  Lungs:  clear to auscultation bilaterally, no wheezing, rhonchi or rales  Abd: soft, nontender, no hepatomegaly  Ext: no edema Musculoskeletal:  No deformities, BUE and BLE strength normal and equal Skin: warm and dry  Neuro:  CNs 2-12 intact, no focal abnormalities noted Psych:  Normal affect   EKG:  The EKG was personally reviewed and demonstrates:  SR with PACs and RBBB Telemetry:  Telemetry was personally reviewed and demonstrates:  paroxsymal atrial tach into the 150s  Relevant CV Studies: 2D Echo 11/16/2016  Study Conclusions  - Left ventricle: ABnormal septal motion. Rhythm appears to be   wenkebach The cavity size was normal. There was mild focal basal   hypertrophy of the septum. Systolic function was normal. The   estimated ejection fraction was in the range of 50% to 55%. Wall   motion was normal; there were no regional wall motion   abnormalities. - Mitral valve: Calcified annulus. - Left atrium: The atrium was moderately dilated. - Atrial septum: No defect or patent foramen ovale was identified.   Laboratory Data:  Chemistry Recent Labs Lab 12/28/16 1450 12/28/16 2013  NA 137  --   K 4.2  --   CL 103  --   CO2 23  --   GLUCOSE 114*  --   BUN 13  --   CREATININE 1.05 1.05  CALCIUM 9.2  --   GFRNONAA >60 >60  GFRAA >60 >60  ANIONGAP 11  --     No results for input(s): PROT, ALBUMIN, AST, ALT, ALKPHOS, BILITOT in the last 168 hours. Hematology Recent Labs Lab 12/28/16 1450  WBC 7.6  RBC 4.77  HGB 14.4  HCT 42.2  MCV 88.5  MCH 30.2  MCHC 34.1  RDW 13.7  PLT 213   Cardiac Enzymes Recent Labs Lab 12/28/16 2013 12/29/16 0211 12/29/16 0614  TROPONINI <0.03 <0.03 <0.03    Recent Labs Lab 12/28/16 1517  TROPIPOC 0.00    BNPNo results for input(s): BNP, PROBNP in the last 168 hours.  DDimer No results for input(s): DDIMER in the last 168 hours.  Radiology/Studies:  Dg Chest 2 View  Result Date: 12/28/2016 CLINICAL  DATA:  Chest pain.  Up and down blood pressure. EXAM: CHEST  2 VIEW COMPARISON:  01/21/2011 FINDINGS: Borderline heart size. Stable mild aortic tortuosity. Negative hila. There is no edema, consolidation, effusion, or pneumothorax. Spondylosis.  No acute osseous finding. IMPRESSION: No evidence of active disease. Electronically Signed   By: Monte Fantasia M.D.   On: 12/28/2016 16:04    Assessment and Plan:   1. Chest Pain: in the setting of elevated BP and atrial tach. Cardiac enzymes are negative x 3. Based on history he has conduction disease and will need a PPM. He has never had an ischemic evaluation. We will arrange for a Lexiscan NST today to r/o ischemia. He has had a recent 2D echo that showed normal LVEF and no significant valvular disease.   2. Arrhthymias: recent outpatient monitor showed 2nd degree AVB and marked bradycardia. Pt advised to get PPM in September but wanted to wait and get a 2nd opinion. He has appt with Dr. Caryl [name] in 2 weeks. Also noted to have atrial tach on telemetry. Will order nuclear stress test today to r/o underlying CAD. Monitor on telemetry. No rate control agents given brady arrthymias. He will need PPM.   3. HTN: still moderately elevated. Continue Losartan. No AVN blocking agents as outlined above. Can consider addition of amlodipine or chlorothalidone.    For questions or updates, please contact [place] Please consult [email or web address] for contact info under Cardiology/STEMI.   Signed, Lyda Jester, PA-C  12/29/2016 9:42 AM   I have seen and examined the patient  along with Lyda Jester, PA-C.  I have reviewed the chart, notes and new data.  I agree with PA/NP's note.  Key new complaints: Throat discomfort is compatible with angina pectoris, but occurs at rest not with physical activity. Key examination changes: Split second heart sound and irregular rhythm, otherwise normal exam Key new findings / data: Review of telemetry shows at least 5  separate episodes of abrupt onset abrupt termination regular supraventricular tachycardia at about 145-150 bpm without discernible atrial activity. Could represent paroxysmal atrial tachycardia or atrial flutter with 2:1 AV block. Does not appear compatible with AVNRT, no long-short initiation sequence.  PLAN: Workup for coronary or structural heart disease with echo and Lexiscan Myoview today. I don't think it's safe to treat his episodes of atrial tachycardia with beta blockers or calcium channel blockers since this will likely worsen the degree of AV block and precipitate the need for a pacemaker, which she is trying to avoid. Discuss further management after we have the results of his workup today. He already has an appointment scheduled with Dr. Caryl [name] in just a few days to get a second opinion on pacemaker therapy. Blood pressure is already much better and may have been situational hypertension related to discomfort and anxiety.  Sanda Klein, MD, [place] [phone] 12/29/2016, 11:14 AM

## 2016-12-29 NOTE — Progress Notes (Signed)
    Nuclear stress test and 2D echo has been reviewed by Dr. Sallyanne Kuster. NST is negative for ischemia. EF is normal by echo. [place] for discharge home from a cardiac standpoint. Pt has been notified of results. We will notify primary MD. He has a consultation with Dr. Caryl Comes for consideration for PPM on 01/12/17. Pt instructed to keep this appt. Date and appt time has been placed in Pt's AVS instructions.   Dekayla Prestridge Rosita Fire

## 2016-12-29 NOTE — Progress Notes (Signed)
Patient ID: [name], male   DOB: 1940-09-09, 76 y.o.   MRN: [id number]  PROGRESS NOTE    [name]  DJM:[id number] DOB: [birth year] DOA: 12/28/2016 PCP: Merrilee Seashore, MD   Brief Narrative: 76 year old male with history of hypertension, hyperlipidemia, sleep apnea and second degree AV block presented with chest pain. Cardiology was consulted   Assessment & Plan:   Principal Problem:   Chest pain Active Problems:   Sleep apnea   Essential hypertension   1. Chest pain - presently chest pain-free.  troponins negative so far. Cardiology consult pending. Echo pending 2. Hypertension: monitor blood pressure. Continue losartan 3. Hyperlipidemia: Continue atorvastatin. 4. Sleep apnea on CPAP. 5. History of second-degree AV block with syncope episodes: follow cardiology evaluation and recommendations    DVT prophylaxis: Lovenox Code Status:  full Family Communication: none at bedside Disposition Plan:home once cleared by cardiology  Consultants: cardiology  Procedures: none  Antimicrobials: none    Subjective: Patient seen and examined at bedside. He denies current chest pain, nausea or vomiting. He states that he is hungry.  Objective: Vitals:   12/29/16 1247 12/29/16 1248 12/29/16 1249 12/29/16 1250  BP: (!) 132/49  (!) 148/59   Pulse: 73 83 78 76  Resp:      Temp:      TempSrc:      SpO2:      Weight:      Height:        Intake/Output Summary (Last 24 hours) at 12/29/16 1338 Last data filed at 12/29/16 0916  Gross per 24 hour  Intake                0 ml  Output                0 ml  Net                0 ml   Filed Weights   12/28/16 1500 12/28/16 2325 12/29/16 0600  Weight: 106.6 kg (235 lb) 103.7 kg (228 lb 9.6 oz) 102.2 kg (225 lb 4.8 oz)    Examination:  General exam: Appears calm and comfortable  Respiratory system: Bilateral decreased breath sound at bases Cardiovascular system: S1 & S2 heard, rate  controlled Gastrointestinal system: Abdomen is nondistended, soft and nontender. Normal bowel sounds heard. Extremities: No cyanosis, clubbing, edema   Data Reviewed: I have personally reviewed following labs and imaging studies  CBC:  Recent Labs Lab 12/28/16 1450  WBC 7.6  HGB 14.4  HCT 42.2  MCV 88.5  PLT 222   Basic Metabolic Panel:  Recent Labs Lab 12/28/16 1450 12/28/16 2013  NA 137  --   K 4.2  --   CL 103  --   CO2 23  --   GLUCOSE 114*  --   BUN 13  --   CREATININE 1.05 1.05  CALCIUM 9.2  --    GFR: Estimated Creatinine Clearance: 72.8 mL/min (by C-G formula based on SCr of 1.05 mg/dL). Liver Function Tests: No results for input(s): AST, ALT, ALKPHOS, BILITOT, PROT, ALBUMIN in the last 168 hours. No results for input(s): LIPASE, AMYLASE in the last 168 hours. No results for input(s): AMMONIA in the last 168 hours. Coagulation Profile: No results for input(s): INR, PROTIME in the last 168 hours. Cardiac Enzymes:  Recent Labs Lab 12/28/16 2013 12/29/16 0211 12/29/16 0614  TROPONINI <0.03 <0.03 <0.03   BNP (last 3 results) No results for input(s): PROBNP in the last 8760  hours. HbA1C: No results for input(s): HGBA1C in the last 72 hours. CBG: No results for input(s): GLUCAP in the last 168 hours. Lipid Profile: No results for input(s): CHOL, HDL, LDLCALC, TRIG, CHOLHDL, LDLDIRECT in the last 72 hours. Thyroid Function Tests: No results for input(s): TSH, T4TOTAL, FREET4, T3FREE, THYROIDAB in the last 72 hours. Anemia Panel: No results for input(s): VITAMINB12, FOLATE, FERRITIN, TIBC, IRON, RETICCTPCT in the last 72 hours. Sepsis Labs: No results for input(s): PROCALCITON, LATICACIDVEN in the last 168 hours.  No results found for this or any previous visit (from the past 240 hour(s)).       Radiology Studies: Dg Chest 2 View  Result Date: 12/28/2016 CLINICAL DATA:  Chest pain.  Up and down blood pressure. EXAM: CHEST  2 VIEW COMPARISON:   01/21/2011 FINDINGS: Borderline heart size. Stable mild aortic tortuosity. Negative hila. There is no edema, consolidation, effusion, or pneumothorax. Spondylosis.  No acute osseous finding. IMPRESSION: No evidence of active disease. Electronically Signed   By: Monte Fantasia M.D.   On: 12/28/2016 16:04        Scheduled Meds: . aspirin EC  325 mg Oral Daily  . atorvastatin  20 mg Oral q morning - 10a  . enoxaparin (LOVENOX) injection  40 mg Subcutaneous QHS  . losartan  100 mg Oral Daily  . potassium chloride SA  20 mEq Oral q morning - 10a  . regadenoson       Continuous Infusions:   LOS: 0 days        [name], MD [facility] Pager [phone]  If 7PM-7AM, please contact night-coverage [email or web address] Password [email or web address] 12/29/2016, 1:38 PM

## 2017-01-02 DIAGNOSIS — M159 Polyosteoarthritis, unspecified: Secondary | ICD-10-CM | POA: Diagnosis not present

## 2017-01-02 DIAGNOSIS — Z23 Encounter for immunization: Secondary | ICD-10-CM | POA: Diagnosis not present

## 2017-01-02 DIAGNOSIS — I441 Atrioventricular block, second degree: Secondary | ICD-10-CM | POA: Diagnosis not present

## 2017-01-02 DIAGNOSIS — E782 Mixed hyperlipidemia: Secondary | ICD-10-CM | POA: Diagnosis not present

## 2017-01-02 DIAGNOSIS — R7303 Prediabetes: Secondary | ICD-10-CM | POA: Diagnosis not present

## 2017-01-02 DIAGNOSIS — Z09 Encounter for follow-up examination after completed treatment for conditions other than malignant neoplasm: Secondary | ICD-10-CM | POA: Diagnosis not present

## 2017-01-10 DIAGNOSIS — L03115 Cellulitis of right lower limb: Secondary | ICD-10-CM | POA: Diagnosis not present

## 2017-01-12 ENCOUNTER — Ambulatory Visit: Payer: Medicare HMO | Admitting: Internal Medicine

## 2017-01-12 ENCOUNTER — Encounter: Payer: Self-pay | Admitting: Internal Medicine

## 2017-01-12 VITALS — BP 146/70 | HR 76 | Ht 71.0 in | Wt 233.0 lb

## 2017-01-12 DIAGNOSIS — R55 Syncope and collapse: Secondary | ICD-10-CM

## 2017-01-12 DIAGNOSIS — I4510 Unspecified right bundle-branch block: Secondary | ICD-10-CM | POA: Diagnosis not present

## 2017-01-12 DIAGNOSIS — I441 Atrioventricular block, second degree: Secondary | ICD-10-CM | POA: Diagnosis not present

## 2017-01-12 DIAGNOSIS — R001 Bradycardia, unspecified: Secondary | ICD-10-CM

## 2017-01-12 DIAGNOSIS — G4733 Obstructive sleep apnea (adult) (pediatric): Secondary | ICD-10-CM

## 2017-01-12 NOTE — Progress Notes (Signed)
ELECTROPHYSIOLOGY CONSULT NOTE  Patient ID: [name], MRN: [id number], DOB/AGE: [birth year] 76 y.o. Admit date: (Not on file) Date of Consult: 01/12/2017  Primary Physician: Merrilee Seashore, MD Primary Cardiologist: Thermon Leyland     [name] is a 76 y.o. male who is being seen today for the evaluation of bradycardia at the request of MCr    HPI [name] is a 76 y.o. male seen as a second opinion for consideration of pacemaker implantation.  The patient has been followed by Dr. Thermon Leyland. Seen originally after a monitor for presyncope.  His lightheadedness symptoms are largely related to bending or standing.  Denies isolated LH   No tachypalpitations, despite recurrent documented SVT prob AVNRT--- these episodes were also identified in hospital telemetry;  Withsinus bradycardia,  conduction system disease with 1 AVB and (maybe) 2AVB1 [ it looks mostly as blocked PACs] medical therapy was not Rx for his SVT   He has history of right bundle branch block  No history of coronary artery disease LVEF 9/18 50-55%  Holter monitor 9/18 Personally reviewed  minimum heart rate 28 nocturnal bradycardia was noted.  There was one episode of tachycardia onset associated with long PR interval consistent with AV nodal reentry  He has sleep disordered breathing and daytime somnolence  Past Medical History:  Diagnosis Date  . Arthritis   . Gout   . Hypertension   . OSA (obstructive sleep apnea)    per pt noncompliant CPAP has not use it since 2015  . Wears dentures    upper      Surgical History:  Past Surgical History:  Procedure Laterality Date  . ELBOW BURSA SURGERY Left 07-21-2009   extensive bursectomy/ tenosynovectomy/ ulnar nerve release  . HAMMER TOE SURGERY  2015   right foot  . MICROLARYNGOSCOPY  06-07-2006   w/  Excision bilateral vocal cord lesions (bilateral benign nodules)     Home Meds: Prior to Admission medications   Medication Sig Start Date End  Date Taking? Authorizing Provider  aspirin EC 81 MG tablet Take 81 mg by mouth daily.      [provider]  atorvastatin (LIPITOR) 20 MG tablet Take 20 mg by mouth every morning.    [provider]  losartan (COZAAR) 100 MG tablet Take 100 mg by mouth daily.    [provider]  Multiple Vitamins-Minerals (MULTIVITAMINS THER. W/MINERALS) TABS Take 1 tablet by mouth daily.      [provider]  nitroGLYCERIN (NITROSTAT) 0.4 MG SL tablet Place 1 tablet (0.4 mg total) under the tongue every 5 (five) minutes as needed for chest pain. 12/29/16   [name], MD  potassium chloride SA (K-DUR,KLOR-CON) 20 MEQ tablet Take 20 mEq by mouth every morning.     [provider]    Allergies: No Known Allergies  Social History   Socioeconomic History  . Marital status: Married    Spouse name: Not on file  . Number of children: Not on file  . Years of education: Not on file  . Highest education level: Not on file  Social Needs  . Financial resource strain: Not on file  . Food insecurity - worry: Not on file  . Food insecurity - inability: Not on file  . Transportation needs - medical: Not on file  . Transportation needs - non-medical: Not on file  Occupational History  . Not on file  Tobacco Use  . Smoking status: Never Smoker  . Smokeless  tobacco: Never Used  Substance and Sexual Activity  . Alcohol use: Yes    Comment: OCCASIONAL  . Drug use: No  . Sexual activity: Not on file  Other Topics Concern  . Not on file  Social History Narrative  . Not on file     Family History  Problem Relation Age of Onset  . Stroke Mother 36       had gangrene of toe, then later "stroke" per patient  . Osteoarthritis Father   . Heart attack Father 22  . Heart attack Sister        sudden cardiac arrest  . Colon cancer Neg Hx      ROS:  Please see the history of present illness.    All other systems reviewed and negative.    Physical Exam:  Blood  pressure (!) 146/70, pulse 76, height 5\' 11"  (1.803 m), weight 233 lb (105.7 kg), SpO2 97 %. General: Well developed, Morbidly obese AA male in no acute distress. Head: Normocephalic, atraumatic, sclera non-icteric, no xanthomas, nares are without discharge. EENT: normal  Lymph Nodes:  none Neck: Negative for carotid bruits. JVD 7-8 Back:without scoliosis kyphosis  Lungs: Clear bilaterally to auscultation without wheezes, rales, or rhonchi. Breathing is unlabored. Heart: IRRRR with S1 S2. 2/6 systolic  murmur . No rubs, or gallops appreciated. Abdomen: Soft, non-tender, non-distended with normoactive bowel sounds. No hepatomegaly. No rebound/guarding. No obvious abdominal masses. Msk:  Strength and tone appear normal for age. Extremities: No clubbing or cyanosis. 1+ edema.  Distal pedal pulses are 2+ and equal bilaterally. Skin: Warm and Dry Neuro: Alert and oriented X 3. CN III-XII intact Grossly normal sensory and motor function . Psych:  Responds to questions appropriately with a normal affect.      Labs: Cardiac Enzymes No results for input(s): CKTOTAL, CKMB, TROPONINI in the last 72 hours. CBC Lab Results  Component Value Date   WBC 7.6 12/28/2016   HGB 14.4 12/28/2016   HCT 42.2 12/28/2016   MCV 88.5 12/28/2016   PLT 213 12/28/2016   PROTIME: No results for input(s): LABPROT, INR in the last 72 hours. Chemistry No results for input(s): NA, K, CL, CO2, BUN, CREATININE, CALCIUM, PROT, BILITOT, ALKPHOS, ALT, AST, GLUCOSE in the last 168 hours.  Invalid input(s): LABALBU Lipids No results found for: CHOL, HDL, LDLCALC, TRIG BNP No results found for: PROBNP Thyroid Function Tests: No results for input(s): TSH, T4TOTAL, T3FREE, THYROIDAB in the last 72 hours.  Invalid input(s): FREET3 Miscellaneous No results found for: DDIMER  Radiology/Studies:  Dg Chest 2 View  Result Date: 12/28/2016 CLINICAL DATA:  Chest pain.  Up and down blood pressure. EXAM: CHEST  2 VIEW  COMPARISON:  01/21/2011 FINDINGS: Borderline heart size. Stable mild aortic tortuosity. Negative hila. There is no edema, consolidation, effusion, or pneumothorax. Spondylosis.  No acute osseous finding. IMPRESSION: No evidence of active disease. Electronically Signed   By: Monte Fantasia M.D.   On: 12/28/2016 16:04   Nm Myocar Multi W/spect W/wall Motion / Ef  Result Date: 12/29/2016 CLINICAL DATA:  Chest pain.  Hypertension. EXAM: MYOCARDIAL IMAGING WITH SPECT (REST AND PHARMACOLOGIC-STRESS) GATED LEFT VENTRICULAR WALL MOTION STUDY LEFT VENTRICULAR EJECTION FRACTION TECHNIQUE: Standard myocardial SPECT imaging was performed after resting intravenous injection of 10 mCi Tc-69m tetrofosmin. Subsequently, intravenous infusion of Lexiscan was performed under the supervision of the Cardiology staff. At peak effect of the drug, 30 mCi Tc-24m tetrofosmin was injected intravenously and standard myocardial SPECT imaging was performed. Quantitative gated  imaging was also performed to evaluate left ventricular wall motion, and estimate left ventricular ejection fraction. COMPARISON:  Chest radiograph 12/28/2016 FINDINGS: Perfusion: Reduced activity in the cardiac apex on stress and rest images, specially along the lateral margin, compatible with apical thinning or apical scar. There is also inferior wall reduced activity on stress and rest images, suggesting mild severity inferior wall scarring. Wall Motion: Mild inferolateral hypokinesis. Left Ventricular Ejection Fraction: 47 % End diastolic volume 025 ml End systolic volume 70 ml (mildly abnormally elevated) IMPRESSION: 1. Scarring in the inferior wall ; apical thinning versus scarring in the cardiac apex. 2. Mild inferior wall hypokinesis. 3. Left ventricular ejection fraction 47% 4. Non invasive risk stratification*: Intermediate *2012 Appropriate Use Criteria for Coronary Revascularization Focused Update: J Am Coll Cardiol. 4270;62(3):762-831.  [email or web address] Electronically Signed   By: Van Clines M.D.   On: 12/29/2016 16:03    DVV:[id number] reviewed  12/28/2016 Sinus @ 76 19[birth year]41 Axis 154 RBBB/LPFB freq Blocked PACs   Assessment and Plan:  Sinus node dysfunction  Trifascicular block intermittent  2AVB1  Sleep disordered breathing  prob OSA  Hypertension with hypertensive heart disease  SVT prob AVNRT  Syncope    The pt has syncope with a multitude of explanations.  His monitor demonstrated SVT and nocturnal bradycardia, as well as MBZ1  2AVB.  While ECG is unchanged from 2016 his symptoms have worsened albethey somewhat nonspecifically.  WIth the documented SVT I agree with the concerns regarding medical therapy that his bradycardia will be a condounding problem.  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

## 2017-01-12 NOTE — Patient Instructions (Addendum)
Medication Instructions:  Your physician recommends that you continue on your current medications as directed. Please refer to the Current Medication list given to you today.  Labwork: None ordered.  Testing/Procedures: Your physician has recommended that you have a defibrillator inserted. An implantable cardioverter defibrillator (ICD) is a small device that is placed in your chest or, in rare cases, your abdomen. This device uses electrical pulses or shocks to help control life-threatening, irregular heartbeats that could lead the heart to suddenly stop beating (sudden cardiac arrest). Leads are attached to the ICD that goes into your heart. This is done in the hospital and usually requires an overnight stay. Please see the instruction sheet given to you today for more information.  Follow-Up:  Follow up with Dr. Caryl [name] as needed.  Any Other Special Instructions Will Be Listed Below (If Applicable).   If you need a refill on your cardiac medications before your next appointment, please call your pharmacy.    Cardioverter Defibrillator Implantation An implantable cardioverter defibrillator (ICD) is a small device that is placed under the skin in the chest or abdomen. An ICD consists of a battery, a small computer (pulse generator), and wires (leads) that go into the heart. An ICD is used to detect and correct two types of dangerous irregular heartbeats (arrhythmias):  A rapid heart rhythm (tachycardia).  An arrhythmia in which the lower chambers of the heart (ventricles) contract in an uncoordinated way (fibrillation).  When an ICD detects tachycardia, it sends a low-energy shock to the heart to restore the heartbeat to normal (cardioversion). This signal is usually painless. If cardioversion does not work or if the ICD detects fibrillation, it delivers a high-energy shock to the heart (defibrillation) to restart the heart. This shock may feel like a strong jolt in the chest. Your health  care provider may prescribe an ICD if:  You have had an arrhythmia that originated in the ventricles.  Your heart has been damaged by a disease or heart condition.  Sometimes, ICDs are programmed to act as a device called a pacemaker. Pacemakers can be used to treat a slow heartbeat (bradycardia) or tachycardia by taking over the heart rate with electrical impulses. Tell a health care provider about:  Any allergies you have.  All medicines you are taking, including vitamins, herbs, eye drops, creams, and over-the-counter medicines.  Any problems you or family members have had with anesthetic medicines.  Any blood disorders you have.  Any surgeries you have had.  Any medical conditions you have.  Whether you are pregnant or may be pregnant. What are the risks? Generally, this is a safe procedure. However, problems may occur, including:  Swelling, bleeding, or bruising.  Infection.  Blood clots.  Damage to other structures or organs, such as nerves, blood vessels, or the heart.  Allergic reactions to medicines used during the procedure.  What happens before the procedure? Staying hydrated Follow instructions from your health care provider about hydration, which may include:  Up to 2 hours before the procedure - you may continue to drink clear liquids, such as water, clear fruit juice, black coffee, and plain tea.  Eating and drinking restrictions Follow instructions from your health care provider about eating and drinking, which may include:  8 hours before the procedure - stop eating heavy meals or foods such as meat, fried foods, or fatty foods.  6 hours before the procedure - stop eating light meals or foods, such as toast or cereal.  6 hours before the procedure -  stop drinking milk or drinks that contain milk.  2 hours before the procedure - stop drinking clear liquids.  Medicine Ask your health care provider about:  Changing or stopping your normal  medicines. This is important if you take diabetes medicines or blood thinners.  Taking medicines such as aspirin and ibuprofen. These medicines can thin your blood. Do not take these medicines before your procedure if your doctor tells you not to.  Tests  You may have blood tests.  You may have a test to check the electrical signals in your heart (electrocardiogram, ECG).  You may have imaging tests, such as a chest X-ray. General instructions  For 24 hours before the procedure, stop using products that contain nicotine or tobacco, such as cigarettes and e-cigarettes. If you need help quitting, ask your health care provider.  Plan to have someone take you home from the hospital or clinic.  You may be asked to shower with a germ-killing soap. What happens during the procedure?  To reduce your risk of infection: ? Your health care team will wash or sanitize their hands. ? Your skin will be washed with soap. ? Hair may be removed from the surgical area.  Small monitors will be put on your body. They will be used to check your heart, blood pressure, and oxygen level.  An IV tube will be inserted into one of your veins.  You will be given one or more of the following: ? A medicine to help you relax (sedative). ? A medicine to numb the area (local anesthetic). ? A medicine to make you fall asleep (general anesthetic).  Leads will be guided through a blood vessel into your heart and attached to your heart muscles. Depending on the ICD, the leads may go into one ventricle or they may go into both ventricles and into an upper chamber of the heart. An X-ray machine (fluoroscope) will be usedto help guide the leads.  A small incision will be made to create a deep pocket under your skin.  The pulse generator will be placed into the pocket.  The ICD will be tested.  The incision will be closed with stitches (sutures), skin glue, or staples.  A bandage (dressing) will be placed over the  incision. This procedure may vary among health care providers and hospitals. What happens after the procedure?  Your blood pressure, heart rate, breathing rate, and blood oxygen level will be monitored often until the medicines you were given have worn off.  A chest X-ray will be taken to check that the ICD is in the right place.  You will need to stay in the hospital for 1-2 days so your health care provider can make sure your ICD is working.  Do not drive for 24 hours if you received a sedative. Ask your health care provider when it is safe for you to drive.  You may be given an identification card explaining that you have an ICD. Summary  An implantable cardioverter defibrillator (ICD) is a small device that is placed under the skin in the chest or abdomen. It is used to detect and correct dangerous irregular heartbeats (arrhythmias).  An ICD consists of a battery, a small computer (pulse generator), and wires (leads) that go into the heart.  When an ICD detects rapid heart rhythm (tachycardia), it sends a low-energy shock to the heart to restore the heartbeat to normal (cardioversion). If cardioversion does not work or if the ICD detects uncoordinated heart contractions (fibrillation), it  delivers a high-energy shock to the heart (defibrillation) to restart the heart.  You will need to stay in the hospital for 1-2 days to make sure your ICD is working. This information is not intended to replace advice given to you by your health care provider. Make sure you discuss any questions you have with your health care provider. Document Released: 11/13/2001 Document Revised: 03/02/2016 Document Reviewed: 03/02/2016 Elsevier Interactive Patient Education  2017 [employer].

## 2017-01-23 ENCOUNTER — Ambulatory Visit (INDEPENDENT_AMBULATORY_CARE_PROVIDER_SITE_OTHER): Payer: Medicare HMO | Admitting: Internal Medicine

## 2017-01-23 ENCOUNTER — Telehealth: Payer: Self-pay | Admitting: Internal Medicine

## 2017-01-23 ENCOUNTER — Encounter: Payer: Self-pay | Admitting: Internal Medicine

## 2017-01-23 VITALS — BP 144/68 | HR 83 | Ht 71.0 in | Wt 236.4 lb

## 2017-01-23 DIAGNOSIS — I441 Atrioventricular block, second degree: Secondary | ICD-10-CM | POA: Diagnosis not present

## 2017-01-23 DIAGNOSIS — Z01812 Encounter for preprocedural laboratory examination: Secondary | ICD-10-CM | POA: Diagnosis not present

## 2017-01-23 DIAGNOSIS — R001 Bradycardia, unspecified: Secondary | ICD-10-CM

## 2017-01-23 DIAGNOSIS — R5383 Other fatigue: Secondary | ICD-10-CM | POA: Insufficient documentation

## 2017-01-23 DIAGNOSIS — R55 Syncope and collapse: Secondary | ICD-10-CM | POA: Diagnosis not present

## 2017-01-23 DIAGNOSIS — I495 Sick sinus syndrome: Secondary | ICD-10-CM | POA: Insufficient documentation

## 2017-01-23 DIAGNOSIS — I471 Supraventricular tachycardia: Secondary | ICD-10-CM | POA: Diagnosis not present

## 2017-01-23 NOTE — Patient Instructions (Addendum)
Dr. Debara Pickett has recommended a PACEMAKER. This will be scheduled with Dr. Sallyanne Kuster. This procedure is done at [facility] are scheduled Wednesday March 15, 2017  You will need to arrive at the hospital, [facility] tower entrance A at 11:30am You will need to have blood work done no more than 14 days prior to this procedure.   You will be scheduled for follow up visit after your hospital procedure.   Please call our office if you need to reschedule this procedure.   CPT: [phone]

## 2017-01-23 NOTE — Telephone Encounter (Signed)
Called patient. He wishes to r/ pacemaker insertion from Jan 9 to Jan 16. Advised patient he should get blood work done between Jan 3 and Jan 14. Informed him I would call him back with instructions after I contact the cath lab.

## 2017-01-23 NOTE — Telephone Encounter (Signed)
New message    Patient calling to discuss 1/9 procedure date. Wants to change date to 1/16.  Please call

## 2017-01-23 NOTE — Telephone Encounter (Signed)
Called cath lab and r/s pacemaker insertion with Dr. Loletha Grayer from 03/15/17 to 03/22/17 @ 130pm, arrival time 1130am  Patient called and made aware.

## 2017-01-23 NOTE — Progress Notes (Signed)
OFFICE NOTE  Chief Complaint:  Follow-up heart block  Primary Care Physician: Jared Seashore, MD  HPI:  [name] is a 76 y.o. male with a past medial history significant for HTN, HLD, CKD stage III, RBBB, OSA, and gout.  He has not been using CPAP for 1 year. He was recently evaluated by his primary care physician for dizziness and fatigue after walking. EKG showed right bundle branch block. Carotid ultrasound was ordered as well which showed less than 50% stenosis. Lab work shows normal red blood cell count, stable renal function, normal liver function. He was referred to cardiology service for further evaluation.  Seen by Dr. Sallyanne [name] and Dr. Caryl [name] who both agree that pacemaker is indicated due to second-degree Mobitz 1 block and pauses greater than 2 seconds.  This is complicated by significant bradycardia.  He is also had presyncope versus syncope.  He had another opinion from Dr. Caryl [name] regarding pacemaker placement and it was noted he had documented SVT, probably AVNRT.  Management may be difficult again due to bradycardia.  Dr. Caryl [name] agrees with pacemaker placement to allow treatment of the SVT.  He also recommended a sleep study.  Mr. [name] says he is asymptomatic today.  He is hesitant still to proceed with the pacemaker, but felt he had a good explanation from Dr. Caryl [name] and would be willing to do it at the beginning of next year.  PMHx:  Past Medical History:  Diagnosis Date  . Arthritis   . Gout   . Hypertension   . OSA (obstructive sleep apnea)    per pt noncompliant CPAP has not use it since 2015  . Wears dentures    upper    Past Surgical History:  Procedure Laterality Date  . 4th HAMMER TOE CORRECTION, EXCISION LESION 3rd TOE RIGHT FOOT Right 12/11/2014   Performed by [name], DPM at [facility] Left 07-21-2009   extensive bursectomy/ tenosynovectomy/ ulnar nerve release  . HAMMER TOE SURGERY  2015   right foot  . MICROLARYNGOSCOPY  06-07-2006   w/  Excision bilateral vocal cord lesions (bilateral benign nodules)    FAMHx:  Family History  Problem Relation Age of Onset  . Stroke Mother 38       had gangrene of toe, then later "stroke" per patient  . Osteoarthritis Father   . Heart attack Father 32  . Heart attack Sister        sudden cardiac arrest  . Colon cancer Neg Hx     SOCHx:   reports that  has never smoked. he has never used smokeless tobacco. He reports that he drinks alcohol. He reports that he does not use drugs.  ALLERGIES:  No Known Allergies  ROS: Pertinent items noted in HPI and remainder of comprehensive ROS otherwise negative.  HOME MEDS: Current Outpatient Medications on File Prior to Visit  Medication Sig Dispense Refill  . aspirin EC 81 MG tablet Take 81 mg by mouth daily.      Marland Kitchen atorvastatin (LIPITOR) 20 MG tablet Take 20 mg by mouth every morning.    Marland Kitchen losartan (COZAAR) 100 MG tablet Take 100 mg by mouth daily.    . Multiple Vitamins-Minerals (MULTIVITAMINS THER. W/MINERALS) TABS Take 1 tablet by mouth daily.      . nitroGLYCERIN (NITROSTAT) 0.4 MG SL tablet Place 1 tablet (0.4 mg total) under the tongue every 5 (five) minutes as needed for chest pain. 30 tablet 0  .  potassium chloride SA (K-DUR,KLOR-CON) 20 MEQ tablet Take 20 mEq by mouth every morning.      No current facility-administered medications on file prior to visit.     LABS/IMAGING: No results found for this or any previous visit (from the past 48 hour(s)). No results found.  LIPID PANEL: No results found for: CHOL, TRIG, HDL, CHOLHDL, VLDL, LDLCALC, LDLDIRECT   WEIGHTS: Wt Readings from Last 3 Encounters:  01/23/17 236 lb 6.4 oz (107.2 kg)  01/12/17 233 lb (105.7 kg)  12/29/16 225 lb 4.8 oz (102.2 kg)    VITALS: BP (!) 144/68   Pulse 83   Ht 5\' 11"  (1.803 m)   Wt 236 lb 6.4 oz (107.2 kg)   BMI 32.97 kg/m   EXAM: General appearance: alert and no distress Lungs: clear  to auscultation bilaterally Heart: regular rate and rhythm Extremities: extremities normal, atraumatic, no cyanosis or edema Neurologic: Grossly normal  EKG: Normal sinus rhythm at 83, trifascicular block- personally reviewed  ASSESSMENT: 1. Presyncope with bradycardia 2. Mobitz 1 second-degree AV block 3. SVT-probable AVNRT 4. Trifascicular block  PLAN: 1.   Mr. [name] has cardiac conduction disease for which she has been symptomatic.  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  He is discussed pacemaker placement with 2 of my partners to agree this is the right course of treatment.  We will arrange for pacemaker placement in early January per his request.  He could follow up with Dr. Sallyanne [name] going forward, since his issues are primarily related to conduction disease.  Jared Casino, MD, [facility], [place] Director of the Advanced Lipid Disorders &  Cardiovascular Risk Reduction Clinic Attending Cardiologist  Direct Dial: [phone]  Fax: [phone]  Website:  [email or web address] Heffler 01/23/2017, 9:40 AM

## 2017-01-24 DIAGNOSIS — L0390 Cellulitis, unspecified: Secondary | ICD-10-CM | POA: Diagnosis not present

## 2017-01-24 DIAGNOSIS — H1012 Acute atopic conjunctivitis, left eye: Secondary | ICD-10-CM | POA: Diagnosis not present

## 2017-01-24 DIAGNOSIS — L309 Dermatitis, unspecified: Secondary | ICD-10-CM | POA: Diagnosis not present

## 2017-01-25 NOTE — Addendum Note (Signed)
Addended by: Zebedee Iba on: 01/25/2017 03:20 PM   Modules accepted: Orders

## 2017-02-06 DIAGNOSIS — M1000 Idiopathic gout, unspecified site: Secondary | ICD-10-CM | POA: Diagnosis not present

## 2017-02-06 DIAGNOSIS — I10 Essential (primary) hypertension: Secondary | ICD-10-CM | POA: Diagnosis not present

## 2017-02-06 DIAGNOSIS — E782 Mixed hyperlipidemia: Secondary | ICD-10-CM | POA: Diagnosis not present

## 2017-02-06 DIAGNOSIS — R7303 Prediabetes: Secondary | ICD-10-CM | POA: Diagnosis not present

## 2017-02-06 DIAGNOSIS — Z0000 Encounter for general adult medical examination without abnormal findings: Secondary | ICD-10-CM | POA: Diagnosis not present

## 2017-02-16 DIAGNOSIS — M1000 Idiopathic gout, unspecified site: Secondary | ICD-10-CM | POA: Diagnosis not present

## 2017-02-16 DIAGNOSIS — R7303 Prediabetes: Secondary | ICD-10-CM | POA: Diagnosis not present

## 2017-02-16 DIAGNOSIS — E782 Mixed hyperlipidemia: Secondary | ICD-10-CM | POA: Diagnosis not present

## 2017-02-16 DIAGNOSIS — R21 Rash and other nonspecific skin eruption: Secondary | ICD-10-CM | POA: Diagnosis not present

## 2017-02-16 DIAGNOSIS — I10 Essential (primary) hypertension: Secondary | ICD-10-CM | POA: Diagnosis not present

## 2017-03-13 DIAGNOSIS — R5383 Other fatigue: Secondary | ICD-10-CM | POA: Diagnosis not present

## 2017-03-13 DIAGNOSIS — Z01812 Encounter for preprocedural laboratory examination: Secondary | ICD-10-CM | POA: Diagnosis not present

## 2017-03-14 LAB — PROTIME-INR
INR: 1 (ref 0.8–1.2)
PROTHROMBIN TIME: 10.7 s (ref 9.1–12.0)

## 2017-03-14 LAB — BASIC METABOLIC PANEL
BUN/Creatinine Ratio: 16 (ref 10–24)
BUN: 21 mg/dL (ref 8–27)
CALCIUM: 9.3 mg/dL (ref 8.6–10.2)
CHLORIDE: 107 mmol/L — AB (ref 96–106)
CO2: 18 mmol/L — AB (ref 20–29)
Creatinine, Ser: 1.35 mg/dL — ABNORMAL HIGH (ref 0.76–1.27)
GFR calc Af Amer: 59 mL/min/{1.73_m2} — ABNORMAL LOW (ref >59)
GFR calc non Af Amer: 51 mL/min/{1.73_m2} — ABNORMAL LOW (ref >59)
GLUCOSE: 111 mg/dL — AB (ref 65–99)
Potassium: 4.9 mmol/L (ref 3.5–5.2)
Sodium: 141 mmol/L (ref 134–144)

## 2017-03-14 LAB — CBC
HEMATOCRIT: 38.7 % (ref 37.5–51.0)
Hemoglobin: 13.1 g/dL (ref 13.0–17.7)
MCH: 29.2 pg (ref 26.6–33.0)
MCHC: 33.9 g/dL (ref 31.5–35.7)
MCV: 86 fL (ref 79–97)
PLATELETS: 220 10*3/uL (ref 150–379)
RBC: 4.48 x10E6/uL (ref 4.14–5.80)
RDW: 14.2 % (ref 12.3–15.4)
WBC: 6.4 10*3/uL (ref 3.4–10.8)

## 2017-03-15 ENCOUNTER — Telehealth: Payer: Self-pay

## 2017-03-15 ENCOUNTER — Telehealth: Payer: Self-pay | Admitting: Internal Medicine

## 2017-03-15 NOTE — Telephone Encounter (Signed)
Patient calling, states that he is returning a call.    Patient also has some questions about his cath procedure

## 2017-03-15 NOTE — Telephone Encounter (Signed)
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

## 2017-03-15 NOTE — Telephone Encounter (Signed)
Attempted to contact patient. Patient pacemaker insertion needs to be rescheduled. Left generic message requesting callback from patient.

## 2017-03-16 NOTE — Telephone Encounter (Signed)
lmtcb

## 2017-03-20 DIAGNOSIS — M19071 Primary osteoarthritis, right ankle and foot: Secondary | ICD-10-CM | POA: Diagnosis not present

## 2017-03-20 DIAGNOSIS — R21 Rash and other nonspecific skin eruption: Secondary | ICD-10-CM | POA: Diagnosis not present

## 2017-03-20 DIAGNOSIS — A499 Bacterial infection, unspecified: Secondary | ICD-10-CM | POA: Diagnosis not present

## 2017-03-20 DIAGNOSIS — N3941 Urge incontinence: Secondary | ICD-10-CM | POA: Diagnosis not present

## 2017-03-21 NOTE — Telephone Encounter (Signed)
lmtcb

## 2017-03-22 ENCOUNTER — Telehealth: Payer: Self-pay | Admitting: Cardiovascular Disease

## 2017-03-22 ENCOUNTER — Ambulatory Visit (HOSPITAL_COMMUNITY): Admission: RE | Admit: 2017-03-22 | Payer: Medicare HMO | Source: Ambulatory Visit | Admitting: Cardiovascular Disease

## 2017-03-22 ENCOUNTER — Encounter (HOSPITAL_COMMUNITY): Admission: RE | Payer: Self-pay | Source: Ambulatory Visit

## 2017-03-22 SURGERY — PACEMAKER IMPLANT

## 2017-03-22 NOTE — Telephone Encounter (Signed)
New message ° °Pt verbalized that he is returning call for RN °

## 2017-03-22 NOTE — Telephone Encounter (Signed)
Will forward to [place]

## 2017-03-29 DIAGNOSIS — L308 Other specified dermatitis: Secondary | ICD-10-CM | POA: Diagnosis not present

## 2017-04-06 DIAGNOSIS — R3915 Urgency of urination: Secondary | ICD-10-CM | POA: Diagnosis not present

## 2017-04-06 DIAGNOSIS — N401 Enlarged prostate with lower urinary tract symptoms: Secondary | ICD-10-CM | POA: Diagnosis not present

## 2017-04-12 ENCOUNTER — Ambulatory Visit (HOSPITAL_COMMUNITY)
Admission: RE | Admit: 2017-04-12 | Discharge: 2017-04-13 | Disposition: A | Discharge status: Home / Self Care | Payer: Medicare HMO | Source: Ambulatory Visit | Attending: Cardiovascular Disease | Admitting: Cardiovascular Disease

## 2017-04-12 ENCOUNTER — Other Ambulatory Visit: Payer: Self-pay

## 2017-04-12 ENCOUNTER — Ambulatory Visit (HOSPITAL_COMMUNITY)
Admission: RE | Disposition: A | Discharge status: Home / Self Care | Payer: Self-pay | Source: Ambulatory Visit | Attending: Cardiovascular Disease

## 2017-04-12 ENCOUNTER — Encounter (HOSPITAL_COMMUNITY): Payer: Self-pay | Admitting: General Practice

## 2017-04-12 DIAGNOSIS — Z7982 Long term (current) use of aspirin: Secondary | ICD-10-CM | POA: Diagnosis not present

## 2017-04-12 DIAGNOSIS — R001 Bradycardia, unspecified: Secondary | ICD-10-CM

## 2017-04-12 DIAGNOSIS — R55 Syncope and collapse: Secondary | ICD-10-CM | POA: Diagnosis present

## 2017-04-12 DIAGNOSIS — M109 Gout, unspecified: Secondary | ICD-10-CM | POA: Diagnosis not present

## 2017-04-12 DIAGNOSIS — Z79899 Other long term (current) drug therapy: Secondary | ICD-10-CM | POA: Diagnosis not present

## 2017-04-12 DIAGNOSIS — G4733 Obstructive sleep apnea (adult) (pediatric): Secondary | ICD-10-CM | POA: Diagnosis not present

## 2017-04-12 DIAGNOSIS — Z950 Presence of cardiac pacemaker: Secondary | ICD-10-CM | POA: Diagnosis present

## 2017-04-12 DIAGNOSIS — I10 Essential (primary) hypertension: Secondary | ICD-10-CM | POA: Insufficient documentation

## 2017-04-12 DIAGNOSIS — Z9119 Patient's noncompliance with other medical treatment and regimen: Secondary | ICD-10-CM | POA: Diagnosis not present

## 2017-04-12 DIAGNOSIS — E785 Hyperlipidemia, unspecified: Secondary | ICD-10-CM | POA: Diagnosis not present

## 2017-04-12 DIAGNOSIS — I495 Sick sinus syndrome: Secondary | ICD-10-CM

## 2017-04-12 DIAGNOSIS — I440 Atrioventricular block, first degree: Secondary | ICD-10-CM | POA: Diagnosis present

## 2017-04-12 DIAGNOSIS — I441 Atrioventricular block, second degree: Secondary | ICD-10-CM | POA: Diagnosis not present

## 2017-04-12 HISTORY — DX: Presence of cardiac pacemaker: Z95.0

## 2017-04-12 HISTORY — DX: Pure hypercholesterolemia, unspecified: E78.00

## 2017-04-12 HISTORY — DX: Personal history of other diseases of the musculoskeletal system and connective tissue: Z87.39

## 2017-04-12 HISTORY — PX: PACEMAKER IMPLANT: EP1218

## 2017-04-12 HISTORY — PX: INSERT / REPLACE / REMOVE PACEMAKER: SUR710

## 2017-04-12 LAB — SURGICAL PCR SCREEN
MRSA, PCR: NEGATIVE
Staphylococcus aureus: POSITIVE — AB

## 2017-04-12 SURGERY — PACEMAKER IMPLANT

## 2017-04-12 MED ORDER — LOSARTAN POTASSIUM 50 MG PO TABS
100.0000 mg | ORAL_TABLET | Freq: Every day | ORAL | Status: DC
  Administered 2017-04-13: 100 mg via ORAL
  Filled 2017-04-12: qty 2

## 2017-04-12 MED ORDER — SODIUM CHLORIDE 0.9 % IV SOLN: 250.0000 mL | INTRAVENOUS | Status: DC | PRN

## 2017-04-12 MED ORDER — CHLORHEXIDINE GLUCONATE CLOTH 2 % EX PADS
6.0000 | MEDICATED_PAD | Freq: Every day | CUTANEOUS | Status: DC
  Administered 2017-04-12 – 2017-04-13 (×2): 6 via TOPICAL

## 2017-04-12 MED ORDER — SODIUM CHLORIDE 0.9 % IV SOLN: INTRAVENOUS | Status: DC

## 2017-04-12 MED ORDER — MUPIROCIN 2 % EX OINT
1.0000 | TOPICAL_OINTMENT | Freq: Once | CUTANEOUS | Status: AC
Start: 2017-04-12 — End: 2017-04-12
  Administered 2017-04-12: 1 via TOPICAL

## 2017-04-12 MED ORDER — SODIUM CHLORIDE 0.9% FLUSH
3.0000 mL | Freq: Two times a day (BID) | INTRAVENOUS | Status: DC
  Administered 2017-04-12 – 2017-04-13 (×2): 3 mL via INTRAVENOUS

## 2017-04-12 MED ORDER — LIDOCAINE HCL (PF) 1 % IJ SOLN
INTRAMUSCULAR | Status: DC | PRN
  Administered 2017-04-12: 40 mL

## 2017-04-12 MED ORDER — IOPAMIDOL (ISOVUE-370) INJECTION 76%
INTRAVENOUS | Status: AC
  Filled 2017-04-12: qty 50

## 2017-04-12 MED ORDER — ACETAMINOPHEN 325 MG PO TABS: 325.0000 mg | ORAL_TABLET | ORAL | Status: DC | PRN

## 2017-04-12 MED ORDER — CEFAZOLIN SODIUM-DEXTROSE 1-4 GM/50ML-% IV SOLN
1.0000 g | Freq: Four times a day (QID) | INTRAVENOUS | Status: AC
  Administered 2017-04-12 – 2017-04-13 (×3): 1 g via INTRAVENOUS
  Filled 2017-04-12 (×3): qty 50

## 2017-04-12 MED ORDER — CEFAZOLIN SODIUM-DEXTROSE 2-4 GM/100ML-% IV SOLN
2.0000 g | INTRAVENOUS | Status: AC
  Administered 2017-04-12: 2 g via INTRAVENOUS
  Filled 2017-04-12: qty 100

## 2017-04-12 MED ORDER — ASPIRIN EC 81 MG PO TBEC: 81.0000 mg | DELAYED_RELEASE_TABLET | Freq: Every day | ORAL | Status: DC

## 2017-04-12 MED ORDER — MUPIROCIN 2 % EX OINT
1.0000 "application " | TOPICAL_OINTMENT | Freq: Two times a day (BID) | CUTANEOUS | Status: DC
  Administered 2017-04-12 – 2017-04-13 (×2): 1 via NASAL

## 2017-04-12 MED ORDER — ONDANSETRON HCL 4 MG/2ML IJ SOLN: 4.0000 mg | Freq: Four times a day (QID) | INTRAMUSCULAR | Status: DC | PRN

## 2017-04-12 MED ORDER — NITROGLYCERIN 0.4 MG SL SUBL: 0.4000 mg | SUBLINGUAL_TABLET | SUBLINGUAL | Status: DC | PRN

## 2017-04-12 MED ORDER — FENTANYL CITRATE (PF) 100 MCG/2ML IJ SOLN
INTRAMUSCULAR | Status: DC | PRN
  Administered 2017-04-12 (×2): 25 ug via INTRAVENOUS

## 2017-04-12 MED ORDER — SODIUM CHLORIDE 0.9 % IV SOLN
INTRAVENOUS | Status: DC
  Administered 2017-04-12: 13:00:00 via INTRAVENOUS

## 2017-04-12 MED ORDER — SODIUM CHLORIDE 0.9% FLUSH: 3.0000 mL | INTRAVENOUS | Status: DC | PRN

## 2017-04-12 MED ORDER — YOU HAVE A PACEMAKER BOOK
Freq: Once | Status: AC
  Administered 2017-04-12: 22:00:00 1
  Filled 2017-04-12: qty 1

## 2017-04-12 MED ORDER — HEPARIN (PORCINE) IN NACL 2-0.9 UNIT/ML-% IJ SOLN
INTRAMUSCULAR | Status: AC
  Filled 2017-04-12: qty 1000

## 2017-04-12 MED ORDER — HEPARIN (PORCINE) IN NACL 2-0.9 UNIT/ML-% IJ SOLN
INTRAMUSCULAR | Status: AC | PRN
  Administered 2017-04-12: 500 mL

## 2017-04-12 MED ORDER — MIDAZOLAM HCL 5 MG/5ML IJ SOLN
INTRAMUSCULAR | Status: DC | PRN
  Administered 2017-04-12 (×2): 1 mg via INTRAVENOUS

## 2017-04-12 MED ORDER — CEFAZOLIN SODIUM-DEXTROSE 2-4 GM/100ML-% IV SOLN
INTRAVENOUS | Status: AC
  Filled 2017-04-12: qty 100

## 2017-04-12 MED ORDER — HEPARIN (PORCINE) IN NACL 2-0.9 UNIT/ML-% IJ SOLN
INTRAMUSCULAR | Status: AC
  Filled 2017-04-12: qty 500

## 2017-04-12 MED ORDER — MUPIROCIN 2 % EX OINT
TOPICAL_OINTMENT | CUTANEOUS | Status: AC
  Administered 2017-04-12: 1 via TOPICAL
  Filled 2017-04-12: qty 22

## 2017-04-12 MED ORDER — SODIUM CHLORIDE 0.9 % IR SOLN
80.0000 mg | Status: AC
  Administered 2017-04-12: 80 mg
  Filled 2017-04-12: qty 2

## 2017-04-12 MED ORDER — MIDAZOLAM HCL 5 MG/5ML IJ SOLN
INTRAMUSCULAR | Status: AC
  Filled 2017-04-12: qty 5

## 2017-04-12 MED ORDER — ATORVASTATIN CALCIUM 20 MG PO TABS
20.0000 mg | ORAL_TABLET | Freq: Every morning | ORAL | Status: DC
  Administered 2017-04-13: 20 mg via ORAL
  Filled 2017-04-12: qty 1

## 2017-04-12 MED ORDER — FENTANYL CITRATE (PF) 100 MCG/2ML IJ SOLN
INTRAMUSCULAR | Status: AC
Start: 2017-04-12 — End: ?
  Filled 2017-04-12: qty 2

## 2017-04-12 MED ORDER — LIDOCAINE HCL (PF) 1 % IJ SOLN
INTRAMUSCULAR | Status: AC
  Filled 2017-04-12: qty 60

## 2017-04-12 MED ORDER — SODIUM CHLORIDE 0.9 % IR SOLN
Status: AC
  Filled 2017-04-12: qty 2

## 2017-04-12 MED ORDER — ASPIRIN EC 81 MG PO TBEC
81.0000 mg | DELAYED_RELEASE_TABLET | Freq: Every day | ORAL | Status: DC
  Administered 2017-04-13: 81 mg via ORAL
  Filled 2017-04-12: qty 1

## 2017-04-12 MED ORDER — OFF THE BEAT BOOK
Freq: Once | Status: AC
  Administered 2017-04-12: 22:00:00 1
  Filled 2017-04-12: qty 1

## 2017-04-12 SURGICAL SUPPLY — 8 items
CABLE SURGICAL S-101-97-12 (CABLE) ×2 IMPLANT
IPG PACE AZUR XT DR MRI W1DR01 (Pacemaker) IMPLANT
LEAD CAPSURE NOVUS 5076-52CM (Lead) ×2 IMPLANT
LEAD CAPSURE NOVUS 5076-58CM (Lead) ×2 IMPLANT
PACE AZURE XT DR MRI W1DR01 (Pacemaker) ×3 IMPLANT
PAD DEFIB LIFELINK (PAD) ×2 IMPLANT
SHEATH CLASSIC 7F (SHEATH) ×4 IMPLANT
TRAY PACEMAKER INSERTION (PACKS) ×2 IMPLANT

## 2017-04-12 NOTE — H&P (Signed)
[name] is an 77 y.o. male.   Chief Complaint: Bradycardia HPI:   Mr. [name] has a long-standing history of obstructive sleep apnea, hypertension, hyperlipidemia with recurrent near syncope who was found to have episodes of marked sinus bradycardia and pauses related to second-degree AV block Mobitz type I on a rhythm monitor.  He is not receiving any medications with negative chronotropic effect.    He presents today for implantation of a dual-chamber permanent pacemaker.  The procedure has been rescheduled a couple of times due to scheduling conflicts during the winter months.  Past Medical History:  Diagnosis Date  . Arthritis   . Gout   . Hypertension   . OSA (obstructive sleep apnea)    per pt noncompliant CPAP has not use it since 2015  . Wears dentures    upper    Past Surgical History:  Procedure Laterality Date  . ELBOW BURSA SURGERY Left 07-21-2009   extensive bursectomy/ tenosynovectomy/ ulnar nerve release  . HAMMER TOE SURGERY  2015   right foot  . HAMMER TOE SURGERY Right 12/11/2014   Procedure: 4th HAMMER TOE CORRECTION, EXCISION LESION 3rd TOE RIGHT FOOT;  Surgeon: Rosemary Holms, DPM;  Location: [facility];  Service: Podiatry;  Laterality: Right;  . MICROLARYNGOSCOPY  06-07-2006   w/  Excision bilateral vocal cord lesions (bilateral benign nodules)    Family History  Problem Relation Age of Onset  . Stroke Mother 96       had gangrene of toe, then later "stroke" per patient  . Osteoarthritis Father   . Heart attack Father 4  . Heart attack Sister        sudden cardiac arrest  . Colon cancer Neg Hx    Social History:  reports that  has never smoked. he has never used smokeless tobacco. He reports that he drinks alcohol. He reports that he does not use drugs.  Allergies: No Known Allergies  Medications Prior to Admission  Medication Sig Dispense Refill  . aspirin EC 81 MG tablet Take 81 mg by mouth daily.      Marland Kitchen atorvastatin  (LIPITOR) 20 MG tablet Take 20 mg by mouth every morning.    Marland Kitchen losartan (COZAAR) 100 MG tablet Take 100 mg by mouth daily.    . Multiple Vitamins-Minerals (MULTIVITAMINS THER. W/MINERALS) TABS Take 1 tablet by mouth daily.      . nitroGLYCERIN (NITROSTAT) 0.4 MG SL tablet Place 1 tablet (0.4 mg total) under the tongue every 5 (five) minutes as needed for chest pain. 30 tablet 0  . potassium chloride SA (K-DUR,KLOR-CON) 20 MEQ tablet Take 20 mEq by mouth every morning.       No results found for this or any previous visit (from the past 48 hour(s)). No results found.  ROS: The patient specifically denies any chest pain at rest exertion, dyspnea at rest or with exertion, orthopnea, paroxysmal nocturnal dyspnea, recent syncope, palpitations, focal neurological deficits, intermittent claudication, lower extremity edema, unexplained weight gain, cough, hemoptysis or wheezing.   Blood pressure (!) 168/88, pulse 70, temperature 98.1 F (36.7 C), temperature source Oral, resp. rate 18, height 5\' 11"  (1.803 m), weight 225 lb (102.1 kg), SpO2 99 %.   General: Alert, oriented x3, no distress, obese Head: no evidence of trauma, PERRL, EOMI, no exophtalmos or lid lag, no myxedema, no xanthelasma; normal ears, nose and oropharynx Neck: normal jugular venous pulsations and no hepatojugular reflux; brisk carotid pulses without delay and no carotid bruits Chest:  clear to auscultation, no signs of consolidation by percussion or palpation, normal fremitus, symmetrical and full respiratory excursions Cardiovascular: normal position and quality of the apical impulse, regular rhythm, normal first and second heart sounds, no murmurs, rubs or gallops Abdomen: no tenderness or distention, no masses by palpation, no abnormal pulsatility or arterial bruits, normal bowel sounds, no hepatosplenomegaly Extremities: no clubbing, cyanosis or edema; 2+ radial, ulnar and brachial pulses bilaterally; 2+ right femoral, posterior  tibial and dorsalis pedis pulses; 2+ left femoral, posterior tibial and dorsalis pedis pulses; no subclavian or femoral bruits Neurological: grossly nonfocal Psych: Normal mood and affect   ECG: His most recent elective cardiogram shows sinus rhythm with biventricular block (right bundle branch block and posterior fascicular block).  ECHO: Significant for mild LVH and diastolic dysfunction, no significant valvular abnormalities, Normal left ventricular systolic function  Assessment/Plan  Mr. Stann [name] has symptomatic bradycardia due to intermittent second-degree AV block Mobitz type I and would benefit from implantation of a dual-chamber permanent pacemaker.  This procedure has been fully reviewed with the patient and written informed consent has been obtained.   Sanda Klein, MD 04/12/2017, 12:24 PM

## 2017-04-12 NOTE — Op Note (Signed)
Procedure report  Procedure performed:  1. Implantation of new dual chamber permanent pacemaker 2. Fluoroscopy 3. Moderate sedation    Reason for procedure: Symptomatic bradycardia due to: Second degree atrioventricular block Mobitz type I    Procedure performed by: Sanda Klein, MD  Complications: None  Estimated blood loss: <10 mL  Medications administered during procedure: Ancef 2 g intravenously Lidocaine 1% 30 mL locally,  Fentanyl 50 mcg intravenously Versed 2 mg intravenously  During this procedure the patient is administered a total of Versed 2 mg and Fentanyl 50 mcg to achieve and maintain moderate conscious sedation.  The patient's heart rate, blood pressure, and oxygen saturation are monitored continuously during the procedure. The period of conscious sedation is 50 minutes, of which I was present face-to-face 100% of this time.   Device details: Generator Medtronic Azure XT DR MRI model [id number] serial number [id number] H Right atrial lead Medtronic [id number] serial number [id number] Right ventricular lead Medtronic [id number] serial number [id number]  Procedure details:  After the risks and benefits of the procedure were discussed the patient provided informed consent and was brought to the cardiac cath lab in the fasting state. The patient was prepped and draped in usual sterile fashion. Local anesthesia with 1% lidocaine was administered to to the left infraclavicular area. A 5-6 cm horizontal incision was made parallel with and 2-3 cm caudal to the left clavicle. Using electrocautery and blunt dissection a prepectoral pocket was created down to the level of the pectoralis major muscle fascia. The pocket was carefully inspected for hemostasis. An antibiotic-soaked sponge was placed in the pocket.  Under fluoroscopic guidance and using the modified Seldinger technique 2 separate venipunctures were performed to access the left subclavian vein. No difficulty was  encountered accessing the vein.  Two J-tip guidewires were subsequently exchanged for two 7 French safe sheaths.  Under fluoroscopic guidance the ventricular lead was advanced to level of the mid to apical right ventricular septum and thet active-fixation helix was deployed. Prominent current of injury was seen. Satisfactory pacing and sensing parameters were recorded. There was no evidence of diaphragmatic stimulation at maximum device output. The safe sheath was peeled away and the lead was secured in place with 2-0 silk.  In similar fashion the right atrial lead was advanced to the level of the atrial appendage. The active-fixation helix was deployed. There was prominent current of injury. Satisfactory  pacing and sensing parameters were recorded. There was no evidence of diaphragmatic stimulation with pacing at maximum device output. The safe sheath was peeled away and the lead was secured in place with 2-0 silk.  The antibiotic-soaked sponge was removed from the pocket. The pocket was flushed with copious amounts of antibiotic solution. Reinspection showed excellent hemostasis..  The ventricular lead was connected to the generator and appropriate ventricular pacing was seen. Subsequently the atrial lead was also connected. Repeat testing of the lead parameters later showed excellent values.  The entire system was then carefully inserted in the pocket with care been taking that the leads and device assumed a comfortable position without pressure on the incision. Great care was taken that the leads be located deep to the generator. The pocket was then closed in layers using 2 layers of 2-0 Vicryl and cutaneous staples, after which a sterile dressing was applied.  At the end of the procedure the following lead parameters were encountered:  Right atrial lead  sensed P waves 4.6, impedance 950ohms, threshold 0.4 V at 0.5 ms pulse width.  Right ventricular  lead sensed R waves 9.6 mV, impedance 1206  ohms, threshold 0.7 V at 0.5 ms pulse width.   Cc:

## 2017-04-13 ENCOUNTER — Ambulatory Visit (HOSPITAL_COMMUNITY): Payer: Medicare HMO

## 2017-04-13 ENCOUNTER — Encounter (HOSPITAL_COMMUNITY): Payer: Self-pay | Admitting: Cardiovascular Disease

## 2017-04-13 DIAGNOSIS — R55 Syncope and collapse: Secondary | ICD-10-CM

## 2017-04-13 DIAGNOSIS — I441 Atrioventricular block, second degree: Secondary | ICD-10-CM

## 2017-04-13 DIAGNOSIS — Z950 Presence of cardiac pacemaker: Secondary | ICD-10-CM | POA: Diagnosis not present

## 2017-04-13 DIAGNOSIS — R001 Bradycardia, unspecified: Secondary | ICD-10-CM

## 2017-04-13 DIAGNOSIS — Z7982 Long term (current) use of aspirin: Secondary | ICD-10-CM | POA: Diagnosis not present

## 2017-04-13 DIAGNOSIS — G4733 Obstructive sleep apnea (adult) (pediatric): Secondary | ICD-10-CM | POA: Diagnosis not present

## 2017-04-13 DIAGNOSIS — E785 Hyperlipidemia, unspecified: Secondary | ICD-10-CM | POA: Diagnosis not present

## 2017-04-13 DIAGNOSIS — Z79899 Other long term (current) drug therapy: Secondary | ICD-10-CM | POA: Diagnosis not present

## 2017-04-13 DIAGNOSIS — Z9119 Patient's noncompliance with other medical treatment and regimen: Secondary | ICD-10-CM | POA: Diagnosis not present

## 2017-04-13 DIAGNOSIS — M109 Gout, unspecified: Secondary | ICD-10-CM | POA: Diagnosis not present

## 2017-04-13 DIAGNOSIS — I10 Essential (primary) hypertension: Secondary | ICD-10-CM | POA: Diagnosis not present

## 2017-04-13 MED FILL — Heparin Sodium (Porcine) 2 Unit/ML in Sodium Chloride 0.9%: INTRAMUSCULAR | Qty: 1000 | Status: AC

## 2017-04-13 NOTE — Discharge Summary (Signed)
Discharge Summary    Patient ID: [name],  MRN: [id number], DOB/AGE: 1941/01/08 77 y.o.  Admit date: 04/12/2017 Discharge date: 04/13/2017  Primary Care Provider: Merrilee Seashore Primary Cardiologist: Sanda Klein, MD  Discharge Diagnoses    Principal Problem:   Second degree atrioventricular block, Mobitz (type) I Active Problems:   Syncope and collapse   Symptomatic bradycardia   Pacemaker   Allergies No Known Allergies  Diagnostic Studies/Procedures    PACEMAKER IMPLANT - Dual Chamber  04/12/2017  Conclusion  1. Implantation of new dual chamber permanent pacemaker 2. Fluoroscopy 3. Moderate sedation   Reason for procedure: Symptomatic bradycardia due to: Second degree atrioventricular block Mobitz type I    Procedure performed by: Sanda Klein, MD  Device details: Generator Medtronic Santina Evans DR MRI model [id number] serial number [id number] H Right atrial lead Medtronic [id number] serial number [id number] Right ventricular lead Medtronic [id number] serial number [id number] _____________   History of Present Illness     Mr. [name] has a long-standing history of obstructive sleep apnea, hypertension, hyperlipidemia with recurrent near syncope who was found to have episodes of marked sinus bradycardia and pauses related to second-degree AV block Mobitz type I on a rhythm monitor. He has had multiple episodes of syncope. He is not receiving any medications with negative chronotropic effect.    He has history of right bundle branch block. No history of coronary artery disease. LVEF 11/2016 50-55%.  Holter monitor 11/2016 showed minimum heart rate 28 nocturnal bradycardia was noted.  There was one episode of tachycardia onset associated with long PR interval consistent with AV nodal reentry.   The patient was seen by Dr. Caryl Comes and it was decided that the patient would benefit from implantation of a dual-chamber permanent pacemaker.   Hospital Course       Consultants: None  The patient was brought into the hospital on 04/12/17 and underwent placement of a Medtronic Azure XT DR MRI model [id number] serial number [id number], dual chamber pacemaker.   Overnight the patient has done well. His blood pressure was elevated. Will resume his home BP meds, losartan 100 mg.  EKG this am showed sinus rhythm with right bundle branch block as previously known, 67 bpm. Telemetry showed sinus rhythm in the 60's with no arrhthymias or pauses overnight.   Chest xray: No pneumothorax, appropriate lead location.  Per PM interrogation: Good lead parameters: P waves 4.16mV, impedance 551 ohm, threshold 0.2mV@0 .18ms. R waves 9.4 mV, impedance 760 ohm, threshold 0.48mV@0 .19ms.  Will follow up in device clinic in 10-14 days for wound check.  Follow up in clinic with Dr. Sallyanne Kuster for device check in 3 months.   Patient has been seen by Dr. Debara Pickett and reviewed by Dr. Sallyanne Kuster today and deemed ready for discharge home. All follow up appointments have been scheduled. Discharge medications are listed below. _____________  Discharge Vitals Blood pressure (!) 166/78, pulse 72, temperature 97.8 F (36.6 C), temperature source Oral, resp. rate 20, height 5\' 11"  (1.803 m), weight 226 lb 10.1 oz (102.8 kg), SpO2 96 %.  Filed Weights   04/12/17 1140 04/13/17 0241  Weight: 225 lb (102.1 kg) 226 lb 10.1 oz (102.8 kg)   Physical Exam  Constitutional: He is oriented to person, place, and time. He appears well-developed and well-nourished. No distress.  HENT:  Head: Normocephalic and atraumatic.  Neck: Normal range of motion. Neck supple. No JVD present.  Cardiovascular: Normal rate, regular rhythm and normal heart sounds. Exam reveals no gallop and no  friction rub.  No murmur heard. Pulmonary/Chest: Effort normal and breath sounds normal. No respiratory distress. He has no wheezes. He has no rales. He exhibits tenderness.  Mild tenderness Left upper chest around pacemaker  site Pacemaker dressing dry and intact with scant bloody drainage along incision line.   Abdominal: Soft. Bowel sounds are normal.  Musculoskeletal: Normal range of motion. He exhibits no edema.  Neurological: He is alert and oriented to person, place, and time.  Skin: Skin is warm and dry.  Psychiatric: He has a normal mood and affect. His behavior is normal. Thought content normal.    Labs & Radiologic Studies    CBC No results for input(s): WBC, NEUTROABS, HGB, HCT, MCV, PLT in the last 72 hours. Basic Metabolic Panel No results for input(s): NA, K, CL, CO2, GLUCOSE, BUN, CREATININE, CALCIUM, MG, PHOS in the last 72 hours. Liver Function Tests No results for input(s): AST, ALT, ALKPHOS, BILITOT, PROT, ALBUMIN in the last 72 hours. No results for input(s): LIPASE, AMYLASE in the last 72 hours. Cardiac Enzymes No results for input(s): CKTOTAL, CKMB, CKMBINDEX, TROPONINI in the last 72 hours. BNP Invalid input(s): POCBNP D-Dimer No results for input(s): DDIMER in the last 72 hours. Hemoglobin A1C No results for input(s): HGBA1C in the last 72 hours. Fasting Lipid Panel No results for input(s): CHOL, HDL, LDLCALC, TRIG, CHOLHDL, LDLDIRECT in the last 72 hours. Thyroid Function Tests No results for input(s): TSH, T4TOTAL, T3FREE, THYROIDAB in the last 72 hours.  Invalid input(s): FREET3 _____________  Dg Chest 2 View  Result Date: 04/13/2017 CLINICAL DATA:  Pacemaker placement EXAM: CHEST  2 VIEW COMPARISON:  12/28/2016 FINDINGS: Left pacer is in place with leads in the right atrium and right ventricle. No pneumothorax. Heart is borderline in size. Lungs are clear. No effusions or acute bony abnormality. IMPRESSION: Left pacer placement without pneumothorax.  No active disease. Electronically Signed   By: Rolm Baptise M.D.   On: 04/13/2017 08:05   Disposition   Pt is being discharged home today in good condition.  Follow-up Plans & Appointments    Follow-up Information     Croitoru, Mihai, MD Follow up.   Specialty:  Cardiology Why:  You have an appointment for pacemaker follow up on Wednesday May 22nd at 11:20. Please arrive 15 minutes early to check in.  Contact information: [address] [place] [address] [phone]        [place] Office Follow up.   Specialty:  Cardiology Why:  You have an appointment on Wednesday February 20th at 2:00 in the device clinic at our [facility] location to have your wound checked.  Contact information: [address] [place] [phone]         Discharge Instructions    Diet - low sodium heart healthy   Complete by:  As directed    Increase activity slowly   Complete by:  As directed       Discharge Medications   Allergies as of 04/13/2017   No Known Allergies     Medication List    TAKE these medications   aspirin EC 81 MG tablet Take 81 mg by mouth daily.   atorvastatin 20 MG tablet Commonly known as:  LIPITOR Take 20 mg by mouth every morning.   losartan 100 MG tablet Commonly known as:  COZAAR Take 100 mg by mouth daily.   multivitamins ther. w/minerals Tabs tablet Take 1 tablet by mouth  daily.   nitroGLYCERIN 0.4 MG SL tablet Commonly known as:  NITROSTAT Place 1 tablet (0.4 mg total) under the tongue every 5 (five) minutes as needed for chest pain.   potassium chloride SA 20 MEQ tablet Commonly known as:  K-DUR,KLOR-CON Take 20 mEq by mouth every morning.          Outstanding Labs/Studies   None  Duration of Discharge Encounter   Greater than 30 minutes including physician time.  Signed, Daune Perch NP 04/13/2017, 11:29 AM

## 2017-04-13 NOTE — Discharge Instructions (Signed)

## 2017-04-13 NOTE — Progress Notes (Signed)
Chest X ray shows appropriate lead location and no obvious lung complications.  Good lead parameters: P waves 4.27mV, impedance 551 ohm, threshold 0.36mV@0 .52ms. R waves 9.4 mV, impedance 760 ohm, threshold 0.76mV@0 .38ms.  DC later this morning if wound looks healthy. Wound check f/u in 10-14 days. F/u in clinic for device check in 3 months.  Sanda Klein, MD, [facility] [phone] office [phone] pager

## 2017-04-21 NOTE — Telephone Encounter (Signed)
Returned call to patient on 03/22/17. Scheduled patient for PPM implant on 04/12/17.

## 2017-04-26 ENCOUNTER — Ambulatory Visit (INDEPENDENT_AMBULATORY_CARE_PROVIDER_SITE_OTHER): Payer: Medicare HMO | Admitting: *Deleted

## 2017-04-26 DIAGNOSIS — I441 Atrioventricular block, second degree: Secondary | ICD-10-CM | POA: Diagnosis not present

## 2017-04-26 LAB — CUP PACEART INCLINIC DEVICE CHECK
Battery Voltage: 3.22 V
Brady Statistic AS VS Percent: 87.86 %
Brady Statistic RA Percent Paced: 12.09 %
Implantable Lead Implant Date: 20190206
Implantable Lead Implant Date: 20190206
Implantable Lead Location: 753860
Implantable Lead Model: 5076
Implantable Pulse Generator Implant Date: 20190206
Lead Channel Impedance Value: 361 Ohm
Lead Channel Pacing Threshold Amplitude: 0.75 V
Lead Channel Pacing Threshold Pulse Width: 0.4 ms
Lead Channel Pacing Threshold Pulse Width: 0.4 ms
Lead Channel Sensing Intrinsic Amplitude: 2.625 mV
Lead Channel Sensing Intrinsic Amplitude: 2.625 mV
Lead Channel Setting Pacing Amplitude: 3.5 V
Lead Channel Setting Pacing Amplitude: 3.5 V
MDC IDC LEAD LOCATION: 753859
MDC IDC MSMT BATTERY REMAINING LONGEVITY: 176 mo
MDC IDC MSMT LEADCHNL RA IMPEDANCE VALUE: 437 Ohm
MDC IDC MSMT LEADCHNL RV IMPEDANCE VALUE: 551 Ohm
MDC IDC MSMT LEADCHNL RV IMPEDANCE VALUE: 646 Ohm
MDC IDC MSMT LEADCHNL RV PACING THRESHOLD AMPLITUDE: 0.5 V
MDC IDC MSMT LEADCHNL RV SENSING INTR AMPL: 9.125 mV
MDC IDC MSMT LEADCHNL RV SENSING INTR AMPL: 9.375 mV
MDC IDC SESS DTM: 20190220143637
MDC IDC SET LEADCHNL RV PACING PULSEWIDTH: 0.4 ms
MDC IDC SET LEADCHNL RV SENSING SENSITIVITY: 0.9 mV
MDC IDC STAT BRADY AP VP PERCENT: 7.46 %
MDC IDC STAT BRADY AP VS PERCENT: 4.62 %
MDC IDC STAT BRADY AS VP PERCENT: 0.06 %
MDC IDC STAT BRADY RV PERCENT PACED: 7.52 %

## 2017-04-26 NOTE — Progress Notes (Signed)
Wound check appointment. Staples removed. Wound without redness or edema. Incision edges approximated, wound well healed. Normal device function. Thresholds, sensing, and impedances consistent with implant measurements. Device programmed at 3.5V for extra safety margin until 3 month visit. Histogram distribution appropriate for patient and level of activity. 2 fast A7V episodes. No high ventricular rates noted. Patient educated about wound care, arm mobility, lifting restrictions. ROV 5/22 with [facility]

## 2017-05-17 ENCOUNTER — Other Ambulatory Visit (HOSPITAL_COMMUNITY): Payer: Self-pay | Admitting: Internal Medicine

## 2017-05-17 ENCOUNTER — Ambulatory Visit (HOSPITAL_COMMUNITY)
Admission: RE | Admit: 2017-05-17 | Discharge: 2017-05-17 | Disposition: A | Discharge status: Home / Self Care | Payer: Medicare HMO | Source: Ambulatory Visit | Attending: Vascular Surgery | Admitting: Vascular Surgery

## 2017-05-17 DIAGNOSIS — R609 Edema, unspecified: Secondary | ICD-10-CM

## 2017-05-17 DIAGNOSIS — R600 Localized edema: Secondary | ICD-10-CM | POA: Diagnosis not present

## 2017-05-17 DIAGNOSIS — M7989 Other specified soft tissue disorders: Secondary | ICD-10-CM | POA: Insufficient documentation

## 2017-05-17 DIAGNOSIS — I82412 Acute embolism and thrombosis of left femoral vein: Secondary | ICD-10-CM | POA: Diagnosis not present

## 2017-05-31 DIAGNOSIS — I82412 Acute embolism and thrombosis of left femoral vein: Secondary | ICD-10-CM | POA: Diagnosis not present

## 2017-05-31 DIAGNOSIS — G4733 Obstructive sleep apnea (adult) (pediatric): Secondary | ICD-10-CM | POA: Diagnosis not present

## 2017-05-31 DIAGNOSIS — R600 Localized edema: Secondary | ICD-10-CM | POA: Diagnosis not present

## 2017-07-11 DIAGNOSIS — G4733 Obstructive sleep apnea (adult) (pediatric): Secondary | ICD-10-CM | POA: Diagnosis not present

## 2017-07-25 DIAGNOSIS — I10 Essential (primary) hypertension: Secondary | ICD-10-CM | POA: Diagnosis not present

## 2017-07-25 DIAGNOSIS — M25571 Pain in right ankle and joints of right foot: Secondary | ICD-10-CM | POA: Diagnosis not present

## 2017-07-25 DIAGNOSIS — M1000 Idiopathic gout, unspecified site: Secondary | ICD-10-CM | POA: Diagnosis not present

## 2017-07-25 DIAGNOSIS — E782 Mixed hyperlipidemia: Secondary | ICD-10-CM | POA: Diagnosis not present

## 2017-07-26 ENCOUNTER — Encounter: Payer: Self-pay | Admitting: Cardiovascular Disease

## 2017-07-26 ENCOUNTER — Ambulatory Visit (INDEPENDENT_AMBULATORY_CARE_PROVIDER_SITE_OTHER): Payer: Medicare HMO | Admitting: Cardiovascular Disease

## 2017-07-26 VITALS — BP 151/76 | HR 71 | Ht 71.0 in | Wt 235.0 lb

## 2017-07-26 DIAGNOSIS — G4733 Obstructive sleep apnea (adult) (pediatric): Secondary | ICD-10-CM

## 2017-07-26 DIAGNOSIS — Z950 Presence of cardiac pacemaker: Secondary | ICD-10-CM | POA: Diagnosis not present

## 2017-07-26 DIAGNOSIS — I441 Atrioventricular block, second degree: Secondary | ICD-10-CM | POA: Diagnosis not present

## 2017-07-26 DIAGNOSIS — I10 Essential (primary) hypertension: Secondary | ICD-10-CM | POA: Diagnosis not present

## 2017-07-26 DIAGNOSIS — E7800 Pure hypercholesterolemia, unspecified: Secondary | ICD-10-CM | POA: Diagnosis not present

## 2017-07-26 NOTE — Progress Notes (Signed)
Cardiology Office Note:    Date:  07/28/2017   ID:  [name], [name] 09-26-1940, MRN [id number]  PCP:  Merrilee Seashore, MD  Cardiologist:  Sanda Klein, MD    Referring MD: Merrilee Seashore, MD   Chief Complaint  Patient presents with  . Pacemaker Check    History of Present Illness:    [name] is a 77 y.o. male with a hx of Mild obesity, hyperlipidemia and hypertension, obstructive sleep apnea, gout returning in follow-up after pacemaker implantation in February 2019 for episodes of second-degree AV block with pauses in excess of 2 seconds and near syncope.  He feels great.  He has not had any further episodes of near syncope or syncope and denies shortness of breath or angina.  He developed swelling in his left calf and was diagnosed with a deep venous thrombosis on March 14.  He is currently taking Eliquis. He has not had any bleeding problems.  He denies cough or hemoptysis.  Edema persists.  The pacemaker site is healed well and it does not bother him.  Device interrogation shows a very active individual.  He is on the move almost 9 hours a day.  Atrial pacing occurs 10% of the time and ventricular pacing occurs 7.3% of the time.  There have been no episodes of ventricular tachycardia or atrial arrhythmia.  His blood pressure is elevated today, but he monitors it at home where it is consistently less than 130/70.  Past Medical History:  Diagnosis Date  . Arthritis    "probably; hands" (04/12/2017)  . High cholesterol   . History of gout   . Hypertension   . OSA (obstructive sleep apnea)    per pt noncompliant CPAP has not use it since 2015  . Presence of permanent cardiac pacemaker 04/12/2017   Dual Chamber  . Wears dentures    upper    Past Surgical History:  Procedure Laterality Date  . ELBOW BURSA SURGERY Left 07-21-2009   extensive bursectomy/ tenosynovectomy/ ulnar nerve release  . HAMMER TOE SURGERY Right 2015  . HAMMER TOE SURGERY Right  12/11/2014   Procedure: 4th HAMMER TOE CORRECTION, EXCISION LESION 3rd TOE RIGHT FOOT;  Surgeon: Rosemary Holms, DPM;  Location: [facility];  Service: Podiatry;  Laterality: Right;  . INSERT / REPLACE / REMOVE PACEMAKER  04/12/2017   Dual Chamber  . MICROLARYNGOSCOPY  06-07-2006   w/  Excision bilateral vocal cord lesions (bilateral benign nodules)  . PACEMAKER IMPLANT N/A 04/12/2017   Procedure: PACEMAKER IMPLANT - Dual Chamber;  Surgeon: Sanda Klein, MD;  Location: [facility];  Service: Cardiovascular;  Laterality: N/A;    Current Medications: Current Meds  Medication Sig  . aspirin EC 81 MG tablet Take 81 mg by mouth daily.    Marland Kitchen atorvastatin (LIPITOR) 20 MG tablet Take 20 mg by mouth every morning.  Marland Kitchen losartan (COZAAR) 100 MG tablet Take 100 mg by mouth daily.  . Multiple Vitamins-Minerals (MULTIVITAMINS THER. W/MINERALS) TABS Take 1 tablet by mouth daily.    . nitroGLYCERIN (NITROSTAT) 0.4 MG SL tablet Place 1 tablet (0.4 mg total) under the tongue every 5 (five) minutes as needed for chest pain.  . potassium chloride SA (K-DUR,KLOR-CON) 20 MEQ tablet Take 20 mEq by mouth every morning.      Allergies:   Patient has no known allergies.   Social History   Socioeconomic History  . Marital status: Married    Spouse name: Not on file  .  Number of children: Not on file  . Years of education: Not on file  . Highest education level: Not on file  Occupational History  . Not on file  Social Needs  . Financial resource strain: Not on file  . Food insecurity:    Worry: Not on file    Inability: Not on file  . Transportation needs:    Medical: Not on file    Non-medical: Not on file  Tobacco Use  . Smoking status: Never Smoker  . Smokeless tobacco: Never Used  Substance and Sexual Activity  . Alcohol use: Yes    Alcohol/week: 1.2 oz    Types: 2 Standard drinks or equivalent per week  . Drug use: No  . Sexual activity: Yes  Lifestyle  . Physical  activity:    Days per week: Not on file    Minutes per session: Not on file  . Stress: Not on file  Relationships  . Social connections:    Talks on phone: Not on file    Gets together: Not on file    Attends religious service: Not on file    Active member of club or organization: Not on file    Attends meetings of clubs or organizations: Not on file    Relationship status: Not on file  Other Topics Concern  . Not on file  Social History Narrative  . Not on file     Family History: The patient's  family history includes Heart attack in his sister; Heart attack (age of onset: 80) in his father; Osteoarthritis in his father; Stroke (age of onset: 67) in his mother. There is no history of Colon cancer. ROS:   Please see the history of present illness.      All other systems reviewed and are negative.  EKGs/Labs/Other Studies Reviewed:     EKG:  EKG is not ordered today.   Recent Labs: 03/13/2017: BUN 21; Creatinine, Ser 1.35; Hemoglobin 13.1; Platelets 220; Potassium 4.9; Sodium 141  Recent Lipid Panel No results found for: CHOL, TRIG, HDL, CHOLHDL, VLDL, LDLCALC, LDLDIRECT  Physical Exam:    VS:  BP (!) 151/76   Pulse 71   Ht 5\' 11"  (1.803 m)   Wt 235 lb (106.6 kg)   BMI 32.78 kg/m     Wt Readings from Last 3 Encounters:  07/26/17 235 lb (106.6 kg)  04/13/17 226 lb 10.1 oz (102.8 kg)  01/23/17 236 lb 6.4 oz (107.2 kg)     General: Alert, oriented x3, no distress, obese Head: no evidence of trauma, PERRL, EOMI, no exophtalmos or lid lag, no myxedema, no xanthelasma; normal ears, nose and oropharynx Neck: normal jugular venous pulsations and no hepatojugular reflux; brisk carotid pulses without delay and no carotid bruits Chest: clear to auscultation, no signs of consolidation by percussion or palpation, normal fremitus, symmetrical and full respiratory excursions.  Healthy-appearing well-healed pacemaker site Cardiovascular: normal position and quality of the apical  impulse, regular rhythm, normal first and widely split second heart sounds, no murmurs, rubs or gallops Abdomen: no tenderness or distention, no masses by palpation, no abnormal pulsatility or arterial bruits, normal bowel sounds, no hepatosplenomegaly Extremities: no clubbing, cyanosis or edema; 2+ radial, ulnar and brachial pulses bilaterally; 2+ right femoral, posterior tibial and dorsalis pedis pulses; 2+ left femoral, posterior tibial and dorsalis pedis pulses; no subclavian or femoral bruits Neurological: grossly nonfocal Psych: Normal mood and affect   ASSESSMENT:    1. Second degree atrioventricular block, Mobitz (type) I  2. Pacemaker   3. Essential hypertension   4. Obstructive sleep apnea   5. Pure hypercholesterolemia    PLAN:    In order of problems listed above:  1. 2nd deg AVB, MT1: Asymptomatic since pacemaker implantation 2. PPM: Normal function, well-healed sites.  Remote downloads every 3 months and yearly office visits. 3. HTN: Monitor.  He reports that it is well controlled on Effexor at home.. 4. OSA: Strongly recommend weight loss and use of CPAP 5. HLP: Lipid profile followed by Dr. Ashby Dawes.  Weight loss advised   Medication Adjustments/Labs and Tests Ordered: Current medicines are reviewed at length with the patient today.  Concerns regarding medicines are outlined above.  No orders of the defined types were placed in this encounter.  No orders of the defined types were placed in this encounter.   Signed, Sanda Klein, MD  07/28/2017 8:53 AM    [facility]

## 2017-07-26 NOTE — Patient Instructions (Signed)
Dr Croitoru recommends that you continue on your current medications as directed. Please refer to the Current Medication list given to you today.  Remote monitoring is used to monitor your Pacemaker or ICD from home. This monitoring reduces the number of office visits required to check your device to one time per year. It allows us to keep an eye on the functioning of your device to ensure it is working properly. You are scheduled for a device check from home on Wednesday, August 21st, 2019. You may send your transmission at any time that day. If you have a wireless device, the transmission will be sent automatically. After your physician reviews your transmission, you will receive a notification with your next transmission date.  To improve our patient care and to more adequately follow your device, [facility] has decided, as a practice, to start following each patient four times a year with your home monitor. This means that you may experience a remote appointment that is close to an in-office appointment with your physician. Your insurance will apply at the same rate as other remote monitoring transmissions.  Dr Croitoru recommends that you schedule a follow-up appointment in 12 months with a pacemaker check. You will receive a reminder letter in the mail two months in advance. If you don't receive a letter, please call our office to schedule the follow-up appointment.  If you need a refill on your cardiac medications before your next appointment, please call your pharmacy. 

## 2017-07-27 DIAGNOSIS — I82412 Acute embolism and thrombosis of left femoral vein: Secondary | ICD-10-CM | POA: Diagnosis not present

## 2017-07-27 DIAGNOSIS — E782 Mixed hyperlipidemia: Secondary | ICD-10-CM | POA: Diagnosis not present

## 2017-07-27 DIAGNOSIS — I10 Essential (primary) hypertension: Secondary | ICD-10-CM | POA: Diagnosis not present

## 2017-07-28 ENCOUNTER — Encounter: Payer: Self-pay | Admitting: Cardiovascular Disease

## 2017-08-11 DIAGNOSIS — G4733 Obstructive sleep apnea (adult) (pediatric): Secondary | ICD-10-CM | POA: Diagnosis not present

## 2017-08-15 ENCOUNTER — Ambulatory Visit (INDEPENDENT_AMBULATORY_CARE_PROVIDER_SITE_OTHER)
Admission: EM | Admit: 2017-08-15 | Discharge: 2017-08-15 | Disposition: A | Discharge status: Home / Self Care | Payer: Medicare HMO | Source: Home / Self Care | Attending: Family Medicine | Admitting: Family Medicine

## 2017-08-15 ENCOUNTER — Encounter (HOSPITAL_COMMUNITY): Payer: Self-pay | Admitting: Emergency Medicine

## 2017-08-15 ENCOUNTER — Other Ambulatory Visit: Payer: Self-pay

## 2017-08-15 ENCOUNTER — Ambulatory Visit (INDEPENDENT_AMBULATORY_CARE_PROVIDER_SITE_OTHER): Payer: Medicare HMO

## 2017-08-15 DIAGNOSIS — R140 Abdominal distension (gaseous): Secondary | ICD-10-CM

## 2017-08-15 DIAGNOSIS — K8010 Calculus of gallbladder with chronic cholecystitis without obstruction: Secondary | ICD-10-CM | POA: Diagnosis not present

## 2017-08-15 DIAGNOSIS — Z86718 Personal history of other venous thrombosis and embolism: Secondary | ICD-10-CM | POA: Diagnosis not present

## 2017-08-15 DIAGNOSIS — K859 Acute pancreatitis without necrosis or infection, unspecified: Secondary | ICD-10-CM | POA: Diagnosis not present

## 2017-08-15 DIAGNOSIS — K8020 Calculus of gallbladder without cholecystitis without obstruction: Secondary | ICD-10-CM | POA: Diagnosis present

## 2017-08-15 DIAGNOSIS — K838 Other specified diseases of biliary tract: Secondary | ICD-10-CM | POA: Diagnosis present

## 2017-08-15 DIAGNOSIS — E7800 Pure hypercholesterolemia, unspecified: Secondary | ICD-10-CM | POA: Diagnosis present

## 2017-08-15 DIAGNOSIS — Z950 Presence of cardiac pacemaker: Secondary | ICD-10-CM | POA: Diagnosis not present

## 2017-08-15 DIAGNOSIS — Z7901 Long term (current) use of anticoagulants: Secondary | ICD-10-CM | POA: Diagnosis not present

## 2017-08-15 DIAGNOSIS — K760 Fatty (change of) liver, not elsewhere classified: Secondary | ICD-10-CM | POA: Diagnosis present

## 2017-08-15 DIAGNOSIS — Z79899 Other long term (current) drug therapy: Secondary | ICD-10-CM | POA: Diagnosis not present

## 2017-08-15 DIAGNOSIS — Z8719 Personal history of other diseases of the digestive system: Secondary | ICD-10-CM | POA: Diagnosis not present

## 2017-08-15 DIAGNOSIS — K5901 Slow transit constipation: Secondary | ICD-10-CM

## 2017-08-15 DIAGNOSIS — K831 Obstruction of bile duct: Secondary | ICD-10-CM | POA: Diagnosis not present

## 2017-08-15 DIAGNOSIS — R109 Unspecified abdominal pain: Secondary | ICD-10-CM | POA: Insufficient documentation

## 2017-08-15 DIAGNOSIS — I441 Atrioventricular block, second degree: Secondary | ICD-10-CM | POA: Diagnosis present

## 2017-08-15 DIAGNOSIS — G4733 Obstructive sleep apnea (adult) (pediatric): Secondary | ICD-10-CM | POA: Diagnosis present

## 2017-08-15 DIAGNOSIS — I10 Essential (primary) hypertension: Secondary | ICD-10-CM | POA: Diagnosis present

## 2017-08-15 DIAGNOSIS — K851 Biliary acute pancreatitis without necrosis or infection: Secondary | ICD-10-CM | POA: Diagnosis present

## 2017-08-15 DIAGNOSIS — R748 Abnormal levels of other serum enzymes: Secondary | ICD-10-CM | POA: Diagnosis not present

## 2017-08-15 DIAGNOSIS — M109 Gout, unspecified: Secondary | ICD-10-CM | POA: Diagnosis present

## 2017-08-15 DIAGNOSIS — N200 Calculus of kidney: Secondary | ICD-10-CM | POA: Diagnosis present

## 2017-08-15 LAB — POCT URINALYSIS DIP (DEVICE)
GLUCOSE, UA: 100 mg/dL — AB
KETONES UR: NEGATIVE mg/dL
Leukocytes, UA: NEGATIVE
NITRITE: NEGATIVE
PH: 8.5 — AB (ref 5.0–8.0)
Protein, ur: 100 mg/dL — AB
Specific Gravity, Urine: 1.015 (ref 1.005–1.030)

## 2017-08-15 MED ORDER — SIMETHICONE 40 MG/0.6ML PO SUSP: 40.0000 mg | Freq: Four times a day (QID) | ORAL | 0 refills | Status: DC | PRN

## 2017-08-15 NOTE — ED Provider Notes (Signed)
[facility]    CSN: [id number] Arrival date & time: 08/15/17  1928     History   Chief Complaint Chief Complaint  Patient presents with  . Abdominal Pain    HPI [name] is a 77 y.o. male.   [name] presents with complaints of abdominal distention and tightness. Worse after eating. Started a week ago and has been off an on. Has been worse than it is now, but today is worse than yesterday. Did have nausea which has improved. No vomiting. No diarrhea. States feels constipated. Last BM yesterday. Normal urination. No back pain or fevers. States has had constipation in the past with similar issues. Has tried miralax which has not helped. He has taken it a total of three times in the past week. Also drinks baking soda and water which has helped some. Denies recent narcotic use. He is on eliquis for hx of DVT. Hx of pacemaker placement, htn, osa, renal insufficiency     ROS per HPI.      Past Medical History:  Diagnosis Date  . Arthritis    "probably; hands" (04/12/2017)  . High cholesterol   . History of gout   . Hypertension   . OSA (obstructive sleep apnea)    per pt noncompliant CPAP has not use it since 2015  . Presence of permanent cardiac pacemaker 04/12/2017   Dual Chamber  . Wears dentures    upper    Patient Active Problem List   Diagnosis Date Noted  . Pacemaker 04/12/2017  . Syncope and collapse 01/23/2017  . Second degree atrioventricular block, Mobitz (type) I 01/23/2017  . Symptomatic bradycardia 01/23/2017  . SVT (supraventricular tachycardia) ([place]) 01/23/2017  . Other fatigue 01/23/2017  . Chest pain 12/28/2016  . Essential hypertension 12/28/2016  . BRBPR (bright red blood per rectum) 11/04/2010  . Mild renal insufficiency 11/04/2010  . Sleep apnea 11/04/2010    Past Surgical History:  Procedure Laterality Date  . ELBOW BURSA SURGERY Left 07-21-2009   extensive bursectomy/ tenosynovectomy/ ulnar nerve release  . HAMMER TOE  SURGERY Right 2015  . HAMMER TOE SURGERY Right 12/11/2014   Procedure: 4th HAMMER TOE CORRECTION, EXCISION LESION 3rd TOE RIGHT FOOT;  Surgeon: Rosemary Holms, DPM;  Location: [facility];  Service: Podiatry;  Laterality: Right;  . INSERT / REPLACE / REMOVE PACEMAKER  04/12/2017   Dual Chamber  . MICROLARYNGOSCOPY  06-07-2006   w/  Excision bilateral vocal cord lesions (bilateral benign nodules)  . PACEMAKER IMPLANT N/A 04/12/2017   Procedure: PACEMAKER IMPLANT - Dual Chamber;  Surgeon: Sanda Klein, MD;  Location: [facility];  Service: Cardiovascular;  Laterality: N/A;       Home Medications    Prior to Admission medications   Medication Sig Start Date End Date Taking? Authorizing Provider  aspirin EC 81 MG tablet Take 81 mg by mouth daily.      [provider]  atorvastatin (LIPITOR) 20 MG tablet Take 20 mg by mouth every morning.    [provider]  ELIQUIS 5 MG TABS tablet 1 TABLET TWICE A DAY 30 DAYS 07/04/17   [provider]  losartan (COZAAR) 100 MG tablet Take 100 mg by mouth daily.    [provider]  Multiple Vitamins-Minerals (MULTIVITAMINS THER. W/MINERALS) TABS Take 1 tablet by mouth daily.      [provider]  nitroGLYCERIN (NITROSTAT) 0.4 MG SL tablet Place 1 tablet (0.4 mg total) under the tongue every 5 (five)  minutes as needed for chest pain. 12/29/16   [name], MD  potassium chloride SA (K-DUR,KLOR-CON) 20 MEQ tablet Take 20 mEq by mouth every morning.     [provider]  simethicone (MYLICON) 40 RW/4.3XV drops Take 0.6 mLs (40 mg total) by mouth 4 (four) times daily as needed for flatulence. 08/15/17   Zigmund Gottron, NP  tamsulosin (FLOMAX) 0.4 MG CAPS capsule TAKE 1 CAPSULE BY MOUTH EVERYDAY AT BEDTIME 07/09/17   [provider]    Family History Family History  Problem Relation Age of Onset  . Stroke Mother 61       had gangrene of toe, then later "stroke" per patient    . Osteoarthritis Father   . Heart attack Father 80  . Heart attack Sister        sudden cardiac arrest  . Colon cancer Neg Hx     Social History Social History   Tobacco Use  . Smoking status: Never Smoker  . Smokeless tobacco: Never Used  Substance Use Topics  . Alcohol use: Yes    Alcohol/week: 1.2 oz    Types: 2 Standard drinks or equivalent per week  . Drug use: No     Allergies   Patient has no known allergies.   Review of Systems Review of Systems   Physical Exam Triage Vital Signs ED Triage Vitals  Enc Vitals Group     BP 08/15/17 2024 (!) 166/74     Pulse Rate 08/15/17 2024 65     Resp 08/15/17 2024 (!) 22     Temp 08/15/17 2024 98.1 F (36.7 C)     Temp Source 08/15/17 2024 Oral     SpO2 08/15/17 2024 100 %     Weight --      Height --      Head Circumference --      Peak Flow --      Pain Score 08/15/17 2022 10     Pain Loc --      Pain Edu? --      Excl. in [place]? --    No data found.  Updated Vital Signs BP (!) 166/74 (BP Location: Left Arm)   Pulse 65   Temp 98.1 F (36.7 C) (Oral)   Resp (!) 22   SpO2 100%    Physical Exam  Constitutional: He is oriented to person, place, and time. He appears well-developed and well-nourished.  Cardiovascular: Normal rate and regular rhythm.  Pacer in place   Pulmonary/Chest: Effort normal and breath sounds normal.  Abdominal: Soft. He exhibits distension. Bowel sounds are increased. There is generalized tenderness. There is no rigidity, no rebound, no guarding, no CVA tenderness, no tenderness at McBurney's point and negative Murphy's sign.  Marked distention noted; without specific abdominal tenderness but generalized   Neurological: He is alert and oriented to person, place, and time.  Skin: Skin is warm and dry.     UC Treatments / Results  Labs (all labs ordered are listed, but only abnormal results are displayed) Labs Reviewed  POCT URINALYSIS DIP (DEVICE) - Abnormal; Notable for the  following components:      Result Value   Glucose, UA 100 (*)    Bilirubin Urine SMALL (*)    Hgb urine dipstick SMALL (*)    pH 8.5 (*)    Protein, ur 100 (*)    All other components within normal limits  URINE CULTURE    EKG None  Radiology Dg Abd 2  Views  Result Date: 08/15/2017 CLINICAL DATA:  Abdominal pain. EXAM: ABDOMEN - 2 VIEW COMPARISON:  September 23, 2015 FINDINGS: Mild fecal loading in the colon. Stones remain in the lower right kidney. No bowel obstruction. No free air, portal venous gas, or pneumatosis. No other acute abnormalities. IMPRESSION: Mild fecal loading in the colon.  Right renal stones. Electronically Signed   By: Dorise Bullion III M.D   On: 08/15/2017 21:06    Procedures Procedures (including critical care time)  Medications Ordered in UC Medications - No data to display  Initial Impression / Assessment and Plan / UC Course  I have reviewed the triage vital signs and the nursing notes.  Pertinent labs & imaging results that were available during my care of the patient were reviewed by me and considered in my medical decision making (see chart for details).     Non toxic, symptoms have been waxing and waning for the past week. No vomiting. Afebrile. Xray reassuring no obstruction. Only mild constipation. Diet changes, miralax increased. gasx prn. Increase fluid intake and activity recommended. Patient already has annual physical scheduled with PCP for follow up and recheck. Return precautions provided. Patient verbalized understanding and agreeable to plan.  Ambulatory out of clinic without difficulty.   Final Clinical Impressions(s) / UC Diagnoses   Final diagnoses:  Abdominal distention  Slow transit constipation     Discharge Instructions     Your xray today is reassuring, you do have mild constipation.  I would like you to take two doses of miralax tomorrow to promote large bowel movement.  May use gas-x up to 4 times a day as well to help  with bloating.  If persistent symptoms please follow up with your primary care provider. If develop vomiting, fevers or severe pain please go to Er.   Avoiding foods that cause bloating and gas is the first method of obtaining relief from this problem. Common foods that cause gas buildup are broccoli, asparagus and onions, pears and peaches, beans and whole grains and carbonated beverages and dairy products.    ED Prescriptions    Medication Sig Dispense Auth. Provider   simethicone (MYLICON) 40 LX/7.2IO drops Take 0.6 mLs (40 mg total) by mouth 4 (four) times daily as needed for flatulence. 30 mL Augusto Gamble B, NP     Controlled Substance Prescriptions [place] Controlled Substance Registry consulted? Not Applicable   Zigmund Gottron, NP 08/15/17 2122

## 2017-08-15 NOTE — ED Triage Notes (Signed)
Abdominal pain intermittently for a week .  Has had nausea, no vomiting.  Had a bm yesterday and says he still feels like he needs to have a bm, but cant do it. Pain travels around torso.  Has been taking miralax for 3-4 days

## 2017-08-15 NOTE — Discharge Instructions (Addendum)
Your xray today is reassuring, you do have mild constipation.  I would like you to take two doses of miralax tomorrow to promote large bowel movement.  May use gas-x up to 4 times a day as well to help with bloating.  If persistent symptoms please follow up with your primary care provider. If develop vomiting, fevers or severe pain please go to Er.   Avoiding foods that cause bloating and gas is the first method of obtaining relief from this problem. Common foods that cause gas buildup are broccoli, asparagus and onions, pears and peaches, beans and whole grains and carbonated beverages and dairy products.

## 2017-08-17 ENCOUNTER — Inpatient Hospital Stay (HOSPITAL_COMMUNITY)
Admission: EM | Admit: 2017-08-17 | Discharge: 2017-08-22 | DRG: 416 | Disposition: A | Discharge status: Home / Self Care | Payer: Medicare HMO | Attending: Family Medicine | Admitting: Family Medicine

## 2017-08-17 ENCOUNTER — Inpatient Hospital Stay (HOSPITAL_COMMUNITY): Payer: Medicare HMO

## 2017-08-17 ENCOUNTER — Encounter (HOSPITAL_COMMUNITY): Payer: Self-pay | Admitting: Emergency Medicine

## 2017-08-17 ENCOUNTER — Other Ambulatory Visit: Payer: Self-pay

## 2017-08-17 DIAGNOSIS — M109 Gout, unspecified: Secondary | ICD-10-CM | POA: Diagnosis present

## 2017-08-17 DIAGNOSIS — G4733 Obstructive sleep apnea (adult) (pediatric): Secondary | ICD-10-CM | POA: Diagnosis present

## 2017-08-17 DIAGNOSIS — K838 Other specified diseases of biliary tract: Secondary | ICD-10-CM | POA: Diagnosis present

## 2017-08-17 DIAGNOSIS — K8020 Calculus of gallbladder without cholecystitis without obstruction: Secondary | ICD-10-CM | POA: Diagnosis present

## 2017-08-17 DIAGNOSIS — Z86718 Personal history of other venous thrombosis and embolism: Secondary | ICD-10-CM

## 2017-08-17 DIAGNOSIS — K859 Acute pancreatitis without necrosis or infection, unspecified: Secondary | ICD-10-CM

## 2017-08-17 DIAGNOSIS — Z7901 Long term (current) use of anticoagulants: Secondary | ICD-10-CM

## 2017-08-17 DIAGNOSIS — Z950 Presence of cardiac pacemaker: Secondary | ICD-10-CM | POA: Diagnosis not present

## 2017-08-17 DIAGNOSIS — R748 Abnormal levels of other serum enzymes: Secondary | ICD-10-CM | POA: Diagnosis not present

## 2017-08-17 DIAGNOSIS — K760 Fatty (change of) liver, not elsewhere classified: Secondary | ICD-10-CM | POA: Diagnosis present

## 2017-08-17 DIAGNOSIS — E7800 Pure hypercholesterolemia, unspecified: Secondary | ICD-10-CM | POA: Diagnosis present

## 2017-08-17 DIAGNOSIS — N200 Calculus of kidney: Secondary | ICD-10-CM | POA: Diagnosis present

## 2017-08-17 DIAGNOSIS — K831 Obstruction of bile duct: Secondary | ICD-10-CM

## 2017-08-17 DIAGNOSIS — K851 Biliary acute pancreatitis without necrosis or infection: Principal | ICD-10-CM | POA: Diagnosis present

## 2017-08-17 DIAGNOSIS — I10 Essential (primary) hypertension: Secondary | ICD-10-CM | POA: Diagnosis present

## 2017-08-17 DIAGNOSIS — I441 Atrioventricular block, second degree: Secondary | ICD-10-CM | POA: Diagnosis present

## 2017-08-17 DIAGNOSIS — Z79899 Other long term (current) drug therapy: Secondary | ICD-10-CM | POA: Diagnosis not present

## 2017-08-17 LAB — COMPREHENSIVE METABOLIC PANEL
ALBUMIN: 3.5 g/dL (ref 3.5–5.0)
ALT: 279 U/L — ABNORMAL HIGH (ref 17–63)
ANION GAP: 12 (ref 5–15)
AST: 177 U/L — ABNORMAL HIGH (ref 15–41)
Alkaline Phosphatase: 284 U/L — ABNORMAL HIGH (ref 38–126)
BILIRUBIN TOTAL: 7.7 mg/dL — AB (ref 0.3–1.2)
BUN: 15 mg/dL (ref 6–20)
CO2: 25 mmol/L (ref 22–32)
Calcium: 8.4 mg/dL — ABNORMAL LOW (ref 8.9–10.3)
Chloride: 101 mmol/L (ref 101–111)
Creatinine, Ser: 0.79 mg/dL (ref 0.61–1.24)
GFR calc non Af Amer: >60 mL/min (ref >60)
GLUCOSE: 112 mg/dL — AB (ref 65–99)
POTASSIUM: 4 mmol/L (ref 3.5–5.1)
SODIUM: 138 mmol/L (ref 135–145)
Total Protein: 6.9 g/dL (ref 6.5–8.1)

## 2017-08-17 LAB — CBC WITH DIFFERENTIAL/PLATELET
BASOS ABS: 0 10*3/uL (ref 0.0–0.1)
Basophils Relative: 0 %
Eosinophils Absolute: 0.1 10*3/uL (ref 0.0–0.7)
Eosinophils Relative: 1 %
HEMATOCRIT: 43.3 % (ref 39.0–52.0)
HEMOGLOBIN: 14.7 g/dL (ref 13.0–17.0)
Lymphocytes Relative: 11 %
Lymphs Abs: 1.1 10*3/uL (ref 0.7–4.0)
MCH: 30.8 pg (ref 26.0–34.0)
MCHC: 33.9 g/dL (ref 30.0–36.0)
MCV: 90.8 fL (ref 78.0–100.0)
MONOS PCT: 11 %
Monocytes Absolute: 1.1 10*3/uL — ABNORMAL HIGH (ref 0.1–1.0)
NEUTROS ABS: 7.7 10*3/uL (ref 1.7–7.7)
NEUTROS PCT: 77 %
Platelets: 189 10*3/uL (ref 150–400)
RBC: 4.77 MIL/uL (ref 4.22–5.81)
RDW: 14.7 % (ref 11.5–15.5)
WBC: 9.9 10*3/uL (ref 4.0–10.5)

## 2017-08-17 LAB — URINALYSIS, ROUTINE W REFLEX MICROSCOPIC
BACTERIA UA: NONE SEEN
BILIRUBIN URINE: NEGATIVE
Glucose, UA: NEGATIVE mg/dL
KETONES UR: NEGATIVE mg/dL
Leukocytes, UA: NEGATIVE
NITRITE: NEGATIVE
Protein, ur: 30 mg/dL — AB
Specific Gravity, Urine: 1.017 (ref 1.005–1.030)
pH: 8 (ref 5.0–8.0)

## 2017-08-17 LAB — URINE CULTURE: CULTURE: NO GROWTH

## 2017-08-17 LAB — LIPASE, BLOOD: Lipase: 3586 U/L — ABNORMAL HIGH (ref 11–51)

## 2017-08-17 MED ORDER — MORPHINE SULFATE (PF) 2 MG/ML IV SOLN
2.0000 mg | INTRAVENOUS | Status: DC | PRN
  Administered 2017-08-19 – 2017-08-20 (×2): 4 mg via INTRAVENOUS
  Filled 2017-08-17 (×2): qty 2

## 2017-08-17 MED ORDER — SODIUM CHLORIDE 0.9 % IV SOLN: INTRAVENOUS | Status: DC

## 2017-08-17 MED ORDER — MORPHINE SULFATE (PF) 4 MG/ML IV SOLN
4.0000 mg | Freq: Once | INTRAVENOUS | Status: AC
  Administered 2017-08-17: 4 mg via INTRAVENOUS
  Filled 2017-08-17: qty 1

## 2017-08-17 MED ORDER — ONDANSETRON HCL 4 MG PO TABS: 4.0000 mg | ORAL_TABLET | Freq: Four times a day (QID) | ORAL | Status: DC | PRN

## 2017-08-17 MED ORDER — SODIUM CHLORIDE 0.9 % IV SOLN
1.5000 g | Freq: Once | INTRAVENOUS | Status: DC
  Filled 2017-08-17: qty 1.5

## 2017-08-17 MED ORDER — ADULT MULTIVITAMIN W/MINERALS CH
1.0000 | ORAL_TABLET | Freq: Every day | ORAL | Status: DC
  Administered 2017-08-17 – 2017-08-22 (×5): 1 via ORAL
  Filled 2017-08-17 (×5): qty 1

## 2017-08-17 MED ORDER — ONDANSETRON HCL 4 MG/2ML IJ SOLN
4.0000 mg | Freq: Once | INTRAMUSCULAR | Status: AC
  Administered 2017-08-17: 4 mg via INTRAVENOUS
  Filled 2017-08-17: qty 2

## 2017-08-17 MED ORDER — ATORVASTATIN CALCIUM 20 MG PO TABS
20.0000 mg | ORAL_TABLET | Freq: Every day | ORAL | Status: DC
  Administered 2017-08-17 – 2017-08-21 (×5): 20 mg via ORAL
  Filled 2017-08-17 (×5): qty 1

## 2017-08-17 MED ORDER — ONDANSETRON HCL 4 MG/2ML IJ SOLN: 4.0000 mg | Freq: Four times a day (QID) | INTRAMUSCULAR | Status: DC | PRN

## 2017-08-17 MED ORDER — ACETAMINOPHEN 325 MG PO TABS: 650.0000 mg | ORAL_TABLET | Freq: Four times a day (QID) | ORAL | Status: DC | PRN

## 2017-08-17 MED ORDER — SODIUM CHLORIDE 0.9 % IV SOLN
INTRAVENOUS | Status: DC
  Administered 2017-08-17 – 2017-08-19 (×6): via INTRAVENOUS

## 2017-08-17 MED ORDER — INDOMETHACIN 50 MG RE SUPP: 100.0000 mg | Freq: Once | RECTAL | Status: DC

## 2017-08-17 MED ORDER — ACETAMINOPHEN 650 MG RE SUPP: 650.0000 mg | Freq: Four times a day (QID) | RECTAL | Status: DC | PRN

## 2017-08-17 NOTE — H&P (Addendum)
History and Physical    [name] FTD:[id number] DOB: 03-16-1940 DOA: 08/17/2017  PCP: Merrilee Seashore, MD  Patient coming from: Home  I have personally briefly reviewed patient's old medical records in [place]  Chief Complaint: Abd pain  HPI: [name] is a 77 y.o. male with medical history significant of HTN, PPM placement in Jan, DVT in March now on eliquis.  Patient presents to the ED with c/o abd pain.  Symptoms onset at ~0WC.  Pain periumbilical area and epigastric area.  Radiates to back.  Nausea, no vomiting.  Denies constipation or diarrhea (subjectively).  Had similar pain last week which didn't improve following miralax.  Pain severe, 10/10.  Notes urine looking yellow for last 2 weeks.   ED Course: Lipase 3500.  AST 177, ALT 279, and ALK 284.  Bili 7.7.  UA interestingly has large blood, but no bili surprisingly.  US abdomen pending.   Review of Systems: As per HPI otherwise 10 point review of systems negative.   Past Medical History:  Diagnosis Date  . Arthritis    "probably; hands" (04/12/2017)  . High cholesterol   . History of gout   . Hypertension   . OSA (obstructive sleep apnea)    per pt noncompliant CPAP has not use it since 2015  . Presence of permanent cardiac pacemaker 04/12/2017   Dual Chamber  . Wears dentures    upper    Past Surgical History:  Procedure Laterality Date  . ELBOW BURSA SURGERY Left 07-21-2009   extensive bursectomy/ tenosynovectomy/ ulnar nerve release  . HAMMER TOE SURGERY Right 2015  . HAMMER TOE SURGERY Right 12/11/2014   Procedure: 4th HAMMER TOE CORRECTION, EXCISION LESION 3rd TOE RIGHT FOOT;  Surgeon: Rosemary Holms, DPM;  Location: [employer];  Service: Podiatry;  Laterality: Right;  . INSERT / REPLACE / REMOVE PACEMAKER  04/12/2017   Dual Chamber  . MICROLARYNGOSCOPY  06-07-2006   w/  Excision bilateral vocal cord lesions (bilateral benign nodules)  . PACEMAKER IMPLANT N/A  04/12/2017   Procedure: PACEMAKER IMPLANT - Dual Chamber;  Surgeon: Sanda Klein, MD;  Location: [place] [facility];  Service: Cardiovascular;  Laterality: N/A;     reports that he has never smoked. He has never used smokeless tobacco. He reports that he drinks about 1.2 oz of alcohol per week. He reports that he does not use drugs.  No Known Allergies  Family History  Problem Relation Age of Onset  . Stroke Mother 84       had gangrene of toe, then later "stroke" per patient  . Osteoarthritis Father   . Heart attack Father 51  . Heart attack Sister        sudden cardiac arrest  . Colon cancer Neg Hx      Prior to Admission medications   Medication Sig Start Date End Date Taking? Authorizing Provider  atorvastatin (LIPITOR) 20 MG tablet Take 20 mg by mouth every morning.   Yes [provider]  ELIQUIS 5 MG TABS tablet 1 TABLET TWICE A DAY 30 DAYS 07/04/17  Yes [provider]  losartan (COZAAR) 100 MG tablet Take 100 mg by mouth daily.   Yes [provider]  Multiple Vitamins-Minerals (MULTIVITAMINS THER. W/MINERALS) TABS Take 1 tablet by mouth daily.     Yes [provider]  potassium chloride SA (K-DUR,KLOR-CON) 20 MEQ tablet Take 20 mEq by mouth every morning.    Yes [provider]  nitroGLYCERIN (  NITROSTAT) 0.4 MG SL tablet Place 1 tablet (0.4 mg total) under the tongue every 5 (five) minutes as needed for chest pain. 12/29/16   [name], MD    Physical Exam: Vitals:   08/17/17 0317 08/17/17 0420 08/17/17 0607  BP: (!) 179/85  (!) 178/83  Pulse: 65  61  Resp: 16  16  Temp: 98.5 F (36.9 C)  98.5 F (36.9 C)  TempSrc: Oral  Oral  SpO2: 97%  98%  Weight:  109.8 kg (242 lb)   Height:  _0  (1.803 m)     Constitutional: NAD, calm, comfortable Eyes: PERRL, lids and conjunctivae normal ENMT: Mucous membranes are moist. Posterior pharynx clear of any exudate or lesions.Normal dentition.  Neck: normal, supple, no  masses, no thyromegaly Respiratory: clear to auscultation bilaterally, no wheezing, no crackles. Normal respiratory effort. No accessory muscle use.  Cardiovascular: Regular rate and rhythm, no murmurs / rubs / gallops. No extremity edema. 2+ pedal pulses. No carotid bruits.  Abdomen: no tenderness, no masses palpated. No hepatosplenomegaly. Bowel sounds positive.  Musculoskeletal: no clubbing / cyanosis. No joint deformity upper and lower extremities. Good ROM, no contractures. Normal muscle tone.  Skin: no rashes, lesions, ulcers. No induration Neurologic: CN 2-12 grossly intact. Sensation intact, DTR normal. Strength 5/5 in all 4.  Psychiatric: Normal judgment and insight. Alert and oriented x 3. Normal mood.    Labs on Admission: I have personally reviewed following labs and imaging studies  CBC: Recent Labs  Lab 08/17/17 0408  WBC 9.9  NEUTROABS 7.7  HGB 14.7  HCT 43.3  MCV 90.8  PLT 378   Basic Metabolic Panel: Recent Labs  Lab 08/17/17 0408  NA 138  K 4.0  CL 101  CO2 25  GLUCOSE 112*  BUN 15  CREATININE 0.79  CALCIUM 8.4*   GFR: Estimated Creatinine Clearance: 99 mL/min (by C-G formula based on SCr of 0.79 mg/dL). Liver Function Tests: Recent Labs  Lab 08/17/17 0408  AST 177*  ALT 279*  ALKPHOS 284*  BILITOT 7.7*  PROT 6.9  ALBUMIN 3.5   Recent Labs  Lab 08/17/17 0408  LIPASE 3,586*   No results for input(s): AMMONIA in the last 168 hours. Coagulation Profile: No results for input(s): INR, PROTIME in the last 168 hours. Cardiac Enzymes: No results for input(s): CKTOTAL, CKMB, CKMBINDEX, TROPONINI in the last 168 hours. BNP (last 3 results) No results for input(s): PROBNP in the last 8760 hours. HbA1C: No results for input(s): HGBA1C in the last 72 hours. CBG: No results for input(s): GLUCAP in the last 168 hours. Lipid Profile: No results for input(s): CHOL, HDL, LDLCALC, TRIG, CHOLHDL, LDLDIRECT in the last 72 hours. Thyroid Function  Tests: No results for input(s): TSH, T4TOTAL, FREET4, T3FREE, THYROIDAB in the last 72 hours. Anemia Panel: No results for input(s): VITAMINB12, FOLATE, FERRITIN, TIBC, IRON, RETICCTPCT in the last 72 hours. Urine analysis:    Component Value Date/Time   COLORURINE AMBER (A) 08/17/2017 0400   APPEARANCEUR CLEAR 08/17/2017 0400   LABSPEC 1.017 08/17/2017 0400   PHURINE 8.0 08/17/2017 0400   GLUCOSEU NEGATIVE 08/17/2017 0400   HGBUR LARGE (A) 08/17/2017 0400   BILIRUBINUR NEGATIVE 08/17/2017 0400   KETONESUR NEGATIVE 08/17/2017 0400   PROTEINUR 30 (A) 08/17/2017 0400   UROBILINOGEN >=8.0 08/15/2017 2034   NITRITE NEGATIVE 08/17/2017 0400   LEUKOCYTESUR NEGATIVE 08/17/2017 0400    Radiological Exams on Admission: Dg Abd 2 Views  Result Date: 08/15/2017 CLINICAL DATA:  Abdominal pain. EXAM:  ABDOMEN - 2 VIEW COMPARISON:  September 23, 2015 FINDINGS: Mild fecal loading in the colon. Stones remain in the lower right kidney. No bowel obstruction. No free air, portal venous gas, or pneumatosis. No other acute abnormalities. IMPRESSION: Mild fecal loading in the colon.  Right renal stones. Electronically Signed   By: Dorise Bullion III M.D   On: 08/15/2017 21:06    EKG: Independently reviewed.  Assessment/Plan Principal Problem:   Acute biliary pancreatitis Active Problems:   Essential hypertension   Pacemaker    1. Acute pancreatitis - with biliary picture 1. [place] abd pending 1. If neg then next step CT abd 2. If positive for biliary obstruction, then I suspect next step will be ERCP 2. IVF: NS at 125 cc/hr 3. Will hold off on ABx for now given no SIRS 4. Repeat CBC / CMP tomorrow AM 5. Call GI this morning 2. HTN - 1. Holding losartan 2. Add PRN hydralazine if needed 3. H/o DVT - Diagnosed March 14th 1. Holding Eliquis 4. PPM - 1. Placed in Jan, does appear to be MRI compatible model and leads.  DVT prophylaxis: SCDs - possible need for ERCP Code Status: Full Family  Communication: No family in room Disposition Plan: Home after admit Consults called: None, Call GI in AM Admission status: Admit to inpatient   Etta Quill DO [facility] Pager [phone] Only works nights!  If 7AM-7PM, please contact the primary day team physician taking care of patient  [email or web address] Password [facility]  08/17/2017, 6:23 AM

## 2017-08-17 NOTE — Progress Notes (Signed)
[facility]   Patient admitted after midnight see H&P for details   77 y/o M with PMHx of HTN, PPM and DVT on Eliquis admitted for acute pancreatitis. US show multiple stone within the gallbladder. GI was consulted planning for US/ERCP in AM. Hold Eliquis for now. Continue IVF and pain control. NPO after midnight. Gen surgery on board for cholecystectomy when indicated. Check CMP and lipase in AM   Chipper Oman, MD

## 2017-08-17 NOTE — Consult Note (Signed)
Referring Provider: Dr. Quincy Simmonds, [facility] Primary Care Physician:  Merrilee Seashore, MD Primary Gastroenterologist:  Dr. Sharlett Iles  Reason for Consultation:  Pancreatitis and elevated LFT's  HPI: [name] is a 77 y.o. male with medical history significant of HTN, PPM placement in Jan, DVT in March now on eliquis.  Patient presented to the ED with c/o abd pain.  Symptoms onset at ~3pm, but reports that he had been experiencing some pain on and off for a week.  Pain periumbilical area and epigastric area.  Radiates to back.  Nausea, no vomiting.  Last BM was yesterday AM prior to coming to the ED.  Pain severe, 10/10.  ED Course: Lipase 3500.  AST 177, ALT 279, and ALK 284.  Bili 7.7.  US abdomen showed the following:  IMPRESSION: Cholelithiasis with numerous gallstones. No sonographic evidence of acute cholecystitis.  Fatty liver.  Right lower pole nephrolithiasis.  Last dose of Eliquis was evening of 6/12.   Past Medical History:  Diagnosis Date  . Arthritis    "probably; hands" (04/12/2017)  . High cholesterol   . History of gout   . Hypertension   . OSA (obstructive sleep apnea)    per pt noncompliant CPAP has not use it since 2015  . Presence of permanent cardiac pacemaker 04/12/2017   Dual Chamber  . Wears dentures    upper    Past Surgical History:  Procedure Laterality Date  . ELBOW BURSA SURGERY Left 07-21-2009   extensive bursectomy/ tenosynovectomy/ ulnar nerve release  . HAMMER TOE SURGERY Right 2015  . HAMMER TOE SURGERY Right 12/11/2014   Procedure: 4th HAMMER TOE CORRECTION, EXCISION LESION 3rd TOE RIGHT FOOT;  Surgeon: Rosemary Holms, DPM;  Location: [place];  Service: Podiatry;  Laterality: Right;  . INSERT / REPLACE / REMOVE PACEMAKER  04/12/2017   Dual Chamber  . MICROLARYNGOSCOPY  06-07-2006   w/  Excision bilateral vocal cord lesions (bilateral benign nodules)  . PACEMAKER IMPLANT N/A 04/12/2017   Procedure: PACEMAKER  IMPLANT - Dual Chamber;  Surgeon: Sanda Klein, MD;  Location: [place] [facility];  Service: Cardiovascular;  Laterality: N/A;    Prior to Admission medications   Medication Sig Start Date End Date Taking? Authorizing Provider  atorvastatin (LIPITOR) 20 MG tablet Take 20 mg by mouth every morning.   Yes [provider]  ELIQUIS 5 MG TABS tablet 1 TABLET TWICE A DAY 30 DAYS 07/04/17  Yes [provider]  losartan (COZAAR) 100 MG tablet Take 100 mg by mouth daily.   Yes [provider]  Multiple Vitamins-Minerals (MULTIVITAMINS THER. W/MINERALS) TABS Take 1 tablet by mouth daily.     Yes [provider]  potassium chloride SA (K-DUR,KLOR-CON) 20 MEQ tablet Take 20 mEq by mouth every morning.    Yes [provider]  nitroGLYCERIN (NITROSTAT) 0.4 MG SL tablet Place 1 tablet (0.4 mg total) under the tongue every 5 (five) minutes as needed for chest pain. 12/29/16   [name], MD    Current Facility-Administered Medications  Medication Dose Route Frequency Provider Last Rate Last Dose  . 0.9 %  sodium chloride infusion   Intravenous Continuous Etta Quill, DO 125 mL/hr at 08/17/17 7026    . acetaminophen (TYLENOL) tablet 650 mg  650 mg Oral Q6H PRN Etta Quill, DO       Or  . acetaminophen (TYLENOL) suppository 650 mg  650 mg Rectal Q6H PRN Etta Quill, DO      .  atorvastatin (LIPITOR) tablet 20 mg  20 mg Oral q1800 Etta Quill, DO      . morphine 2 MG/ML injection 2-4 mg  2-4 mg Intravenous Q4H PRN Etta Quill, DO      . multivitamin with minerals tablet 1 tablet  1 tablet Oral Daily Jennette Kettle M, DO      . ondansetron [facility]) tablet 4 mg  4 mg Oral Q6H PRN Etta Quill, DO       Or  . ondansetron [facility]) injection 4 mg  4 mg Intravenous Q6H PRN Etta Quill, DO        Allergies as of 08/17/2017  . (No Known Allergies)    Family History  Problem Relation Age of Onset  . Stroke Mother 61       had  gangrene of toe, then later "stroke" per patient  . Osteoarthritis Father   . Heart attack Father 63  . Heart attack Sister        sudden cardiac arrest  . Colon cancer Neg Hx     Social History   Socioeconomic History  . Marital status: Married    Spouse name: Not on file  . Number of children: Not on file  . Years of education: Not on file  . Highest education level: Not on file  Occupational History  . Not on file  Social Needs  . Financial resource strain: Not on file  . Food insecurity:    Worry: Not on file    Inability: Not on file  . Transportation needs:    Medical: Not on file    Non-medical: Not on file  Tobacco Use  . Smoking status: Never Smoker  . Smokeless tobacco: Never Used  Substance and Sexual Activity  . Alcohol use: Yes    Alcohol/week: 1.2 oz    Types: 2 Standard drinks or equivalent per week  . Drug use: No  . Sexual activity: Yes  Lifestyle  . Physical activity:    Days per week: Not on file    Minutes per session: Not on file  . Stress: Not on file  Relationships  . Social connections:    Talks on phone: Not on file    Gets together: Not on file    Attends religious service: Not on file    Active member of club or organization: Not on file    Attends meetings of clubs or organizations: Not on file    Relationship status: Not on file  . Intimate partner violence:    Fear of current or ex partner: Not on file    Emotionally abused: Not on file    Physically abused: Not on file    Forced sexual activity: Not on file  Other Topics Concern  . Not on file  Social History Narrative  . Not on file    Review of Systems: ROS is O/W negative except as mentioned in HPI.  Physical Exam: Vital signs in last 24 hours: Temp:  [98.3 F (36.8 C)-98.5 F (36.9 C)] 98.3 F (36.8 C) (06/13 0739) Pulse Rate:  [61-65] 65 (06/13 0739) Resp:  [15-16] 15 (06/13 0739) BP: (162-179)/(83-89) 162/89 (06/13 0739) SpO2:  [97 %-99 %] 99 % (06/13  0739) Weight:  [242 lb (109.8 kg)] 242 lb (109.8 kg) (06/13 0420)   General:  Alert, Well-developed, well-nourished, pleasant and cooperative in NAD Head:  Normocephalic and atraumatic. Eyes:  Scleral icterus noted. Ears:  Normal auditory acuity. Mouth:  No deformity  or lesions.   Lungs:  Clear throughout to auscultation.  No wheezes, crackles, or rhonchi.  Heart:  Regular rate and rhythm; no murmurs, clicks, rubs, or gallops. Abdomen:  Soft, non-distended.  BS present but quiet.  Very mild epigastric TTP.  Msk:  Symmetrical without gross deformities. Pulses:  Normal pulses noted. Extremities:  Without clubbing or edema. Neurologic:  Alert and oriented x 4;  grossly normal neurologically. Skin:  Intact without significant lesions or rashes. Psych:  Alert and cooperative. Normal mood and affect.  Lab Results: Recent Labs    08/17/17 0408  WBC 9.9  HGB 14.7  HCT 43.3  PLT 189   BMET Recent Labs    08/17/17 0408  NA 138  K 4.0  CL 101  CO2 25  GLUCOSE 112*  BUN 15  CREATININE 0.79  CALCIUM 8.4*   LFT Recent Labs    08/17/17 0408  PROT 6.9  ALBUMIN 3.5  AST 177*  ALT 279*  ALKPHOS 284*  BILITOT 7.7*   Studies/Results: Dg Abd 2 Views  Result Date: 08/15/2017 CLINICAL DATA:  Abdominal pain. EXAM: ABDOMEN - 2 VIEW COMPARISON:  September 23, 2015 FINDINGS: Mild fecal loading in the colon. Stones remain in the lower right kidney. No bowel obstruction. No free air, portal venous gas, or pneumatosis. No other acute abnormalities. IMPRESSION: Mild fecal loading in the colon.  Right renal stones. Electronically Signed   By: Dorise Bullion III M.D   On: 08/15/2017 21:06   IMPRESSION:  *77 year old male with what appears to be biliary pancreatitis.  LFT's significantly elevated and ultrasound showed gallstones, but CBD actually normal.   PLAN: *We are going to tentatively plan for ERCP and possible EUS on 6/12. *Will recheck LFT's in AM. *Already spoke to surgery who will  see the patient as well as he will likely need cholecystectomy at some point. *Eliquis needs to be held and if it is determined that he needs anticoagulation then heparin would need to be held for 4-5 hours prior.   Laban Emperor. Hermenegildo Clausen  08/17/2017, 9:08 AM

## 2017-08-17 NOTE — ED Triage Notes (Signed)
Pt is from with c/o abd pain onset 1500 today but similar symptoms also occurred yesterday around the same time.Pt reports abd pain that occasional radiated to his back and chest causing him to be SOB. BP left arm 179/85 (113), right arm 168/85 (109). Denies current SOB or chest pain

## 2017-08-17 NOTE — Consult Note (Signed)
[facility] Surgery Consult Note  [name] [birth year]  [id number].    Requesting MD: Owens Loffler Chief Complaint/Reason for Consult: pancreatitis, suspect biliary   HPI:  [name] is a 77 y/o M with a PMH HTN, PPM placement 04/12/17, DVT in 05/2017 who presented to [facility] with a cc abd pain. Pain located in epigastrium/upper abdomen with some radiation to his back between his shoulder blades. Associated sxs include nausea but patient denies vomiting or diarrhea. Pain worse with PO intake. States that he had some similar but less severe symptoms about 1 weeks ago, but they resolved without intervention. Pain did not resolve so he came to the ED  ED workup: Lipase 3500, AST 177, ALT 279, and ALK 284.  Bili 7.7. RUQ U/S revealed multiple gallstones without signs of cholecystitis. No leukocytosis. UA negative for UTI.   Patient was admitted to the medicine service for acute pancreatitis. GI has consulted due to elevated bilirubin and concern for choledocholithiasis. He is planned for EUS tomorrow; if this shows choledocholithiasis he will undergo ERCP.  General surgery consulted for consideration of laparoscopic cholecystectomy during this admission.  Pt has NKDA. Never smoker. Denies illicit drug use. Denies past abdominal surgeries. Lives at home with his wife and 2 grandchildren. Currently on Eliquis - last dose 08/16/17.    ROS: Review of Systems  Constitutional: Negative for chills and fever.  HENT: Negative.   Eyes: Negative.   Respiratory: Negative.   Cardiovascular: Negative.   Gastrointestinal: Positive for abdominal pain, constipation and nausea. Negative for diarrhea.  Genitourinary: Negative.   Musculoskeletal: Negative.   Skin: Negative.   Neurological: Negative.   All other systems reviewed and are negative.   Family History  Problem Relation Age of Onset  . Stroke Mother 22       had gangrene of toe, then later "stroke" per patient  . Osteoarthritis  Father   . Heart attack Father 81  . Heart attack Sister        sudden cardiac arrest  . Colon cancer Neg Hx     Past Medical History:  Diagnosis Date  . Arthritis    "probably; hands" (04/12/2017)  . High cholesterol   . History of gout   . Hypertension   . OSA (obstructive sleep apnea)    per pt noncompliant CPAP has not use it since 2015  . Presence of permanent cardiac pacemaker 04/12/2017   Dual Chamber  . Wears dentures    upper    Past Surgical History:  Procedure Laterality Date  . ELBOW BURSA SURGERY Left 07-21-2009   extensive bursectomy/ tenosynovectomy/ ulnar nerve release  . HAMMER TOE SURGERY Right 2015  . HAMMER TOE SURGERY Right 12/11/2014   Procedure: 4th HAMMER TOE CORRECTION, EXCISION LESION 3rd TOE RIGHT FOOT;  Surgeon: Rosemary Holms, DPM;  Location: [place];  Service: Podiatry;  Laterality: Right;  . INSERT / REPLACE / REMOVE PACEMAKER  04/12/2017   Dual Chamber  . MICROLARYNGOSCOPY  06-07-2006   w/  Excision bilateral vocal cord lesions (bilateral benign nodules)  . PACEMAKER IMPLANT N/A 04/12/2017   Procedure: PACEMAKER IMPLANT - Dual Chamber;  Surgeon: Sanda Klein, MD;  Location: [place] [facility];  Service: Cardiovascular;  Laterality: N/A;    Social History:  reports that he has never smoked. He has never used smokeless tobacco. He reports that he drinks about 1.2 oz of alcohol per week. He reports that he does not use drugs.  Allergies: No Known Allergies  Medications Prior to Admission  Medication Sig Dispense Refill  . atorvastatin (LIPITOR) 20 MG tablet Take 20 mg by mouth every morning.    Marland Kitchen ELIQUIS 5 MG TABS tablet 1 TABLET TWICE A DAY 30 DAYS  3  . losartan (COZAAR) 100 MG tablet Take 100 mg by mouth daily.    . Multiple Vitamins-Minerals (MULTIVITAMINS THER. W/MINERALS) TABS Take 1 tablet by mouth daily.      . potassium chloride SA (K-DUR,KLOR-CON) 20 MEQ tablet Take 20 mEq by mouth every morning.     .  nitroGLYCERIN (NITROSTAT) 0.4 MG SL tablet Place 1 tablet (0.4 mg total) under the tongue every 5 (five) minutes as needed for chest pain. 30 tablet 0    Blood pressure (!) 162/75, pulse 61, temperature 98.2 F (36.8 C), temperature source Oral, resp. rate 15, height '5\' 11"'  (1.803 m), weight 109.8 kg (242 lb), SpO2 99 %. Physical Exam: Physical Exam  Constitutional: He is oriented to person, place, and time. He appears well-developed and well-nourished.  Non-toxic appearance. No distress.  HENT:  Head: Normocephalic and atraumatic.  Mouth/Throat: Oropharynx is clear and moist.  Eyes: Pupils are equal, round, and reactive to light. EOM are normal.  Cardiovascular: Normal rate, regular rhythm, normal heart sounds and intact distal pulses.  Pulmonary/Chest: Effort normal and breath sounds normal. No stridor. No respiratory distress. He has no wheezes. He has no rhonchi. He has no rales. He exhibits no tenderness.  Abdominal: Soft. Normal appearance and bowel sounds are normal. He exhibits distension. There is no hepatosplenomegaly. There is tenderness in the right upper quadrant, epigastric area and left upper quadrant. There is no rebound and no guarding. No hernia.  Well healed 3cm stab wound LUQ  Neurological: He is alert and oriented to person, place, and time.  Skin: Skin is warm and dry. No rash noted. He is not diaphoretic. No erythema.  Psychiatric: He has a normal mood and affect. His behavior is normal.  Nursing note and vitals reviewed.   Results for orders placed or performed during the hospital encounter of 08/17/17 (from the past 48 hour(s))  Urinalysis, Routine w reflex microscopic     Status: Abnormal   Collection Time: 08/17/17  4:00 AM  Result Value Ref Range   Color, Urine AMBER (A) YELLOW    Comment: BIOCHEMICALS MAY BE AFFECTED BY COLOR   APPearance CLEAR CLEAR   Specific Gravity, Urine 1.017 1.005 - 1.030   pH 8.0 5.0 - 8.0   Glucose, UA NEGATIVE NEGATIVE mg/dL    Hgb urine dipstick LARGE (A) NEGATIVE   Bilirubin Urine NEGATIVE NEGATIVE   Ketones, ur NEGATIVE NEGATIVE mg/dL   Protein, ur 30 (A) NEGATIVE mg/dL   Nitrite NEGATIVE NEGATIVE   Leukocytes, UA NEGATIVE NEGATIVE   RBC / HPF >50 (H) 0 - 5 RBC/hpf   WBC, UA 0-5 0 - 5 WBC/hpf   Bacteria, UA NONE SEEN NONE SEEN   Mucus PRESENT     Comment: Performed at [facility], [place] [address] [place], [place] [address]  Comprehensive metabolic panel     Status: Abnormal   Collection Time: 08/17/17  4:08 AM  Result Value Ref Range   Sodium 138 135 - 145 mmol/L   Potassium 4.0 3.5 - 5.1 mmol/L   Chloride 101 101 - 111 mmol/L   CO2 25 22 - 32 mmol/L   Glucose, Bld 112 (H) 65 - 99 mg/dL   BUN 15 6 - 20 mg/dL   Creatinine, Ser  0.79 0.61 - 1.24 mg/dL   Calcium 8.4 (L) 8.9 - 10.3 mg/dL   Total Protein 6.9 6.5 - 8.1 g/dL   Albumin 3.5 3.5 - 5.0 g/dL   AST 177 (H) 15 - 41 U/L   ALT 279 (H) 17 - 63 U/L   Alkaline Phosphatase 284 (H) 38 - 126 U/L   Total Bilirubin 7.7 (H) 0.3 - 1.2 mg/dL   GFR calc non Af Amer >60 >60 mL/min   GFR calc Af Amer >60 >60 mL/min    Comment: (NOTE) The eGFR has been calculated using the CKD EPI equation. This calculation has not been validated in all clinical situations. eGFR's persistently <60 mL/min signify possible Chronic Kidney Disease.    Anion gap 12 5 - 15    Comment: Performed at [facility], [place] [address] [place], [place] [address]  Lipase, blood     Status: Abnormal   Collection Time: 08/17/17  4:08 AM  Result Value Ref Range   Lipase 3,586 (H) 11 - 51 U/L    Comment: RESULTS CONFIRMED BY MANUAL DILUTION Performed at [facility], [place] [address], [place], [place] [address]   CBC with Differential     Status: Abnormal   Collection Time: 08/17/17  4:08 AM  Result Value Ref Range   WBC 9.9 4.0 - 10.5 K/uL   RBC 4.77 4.22 - 5.81 MIL/uL   Hemoglobin 14.7 13.0 - 17.0 g/dL   HCT 43.3 39.0 - 52.0 %    MCV 90.8 78.0 - 100.0 fL   MCH 30.8 26.0 - 34.0 pg   MCHC 33.9 30.0 - 36.0 g/dL   RDW 14.7 11.5 - 15.5 %   Platelets 189 150 - 400 K/uL   Neutrophils Relative % 77 %   Neutro Abs 7.7 1.7 - 7.7 K/uL   Lymphocytes Relative 11 %   Lymphs Abs 1.1 0.7 - 4.0 K/uL   Monocytes Relative 11 %   Monocytes Absolute 1.1 (H) 0.1 - 1.0 K/uL   Eosinophils Relative 1 %   Eosinophils Absolute 0.1 0.0 - 0.7 K/uL   Basophils Relative 0 %   Basophils Absolute 0.0 0.0 - 0.1 K/uL    Comment: Performed at [facility], [place] [address] [place], [place] [address]   US Abdomen Complete  Result Date: 08/17/2017 CLINICAL DATA:  Jaundice, pancreatitis EXAM: ABDOMEN ULTRASOUND COMPLETE COMPARISON:  CT 02/15/2015 FINDINGS: Gallbladder: Numerous small stones filling the gallbladder, the largest 9 mm. The gallbladder fundus is not well visualized due to overlying shadowing. Gallbladder wall borderline in thickness at 3 mm. Negative sonographic Murphy's. Common bile duct: Diameter: Normal caliber, 6 mm Liver: Increased echotexture compatible with fatty infiltration. No focal abnormality or biliary ductal dilatation. Portal vein is patent on color Doppler imaging with normal direction of blood flow towards the liver. IVC: No abnormality visualized. Pancreas: Pancreas not well visualized due to overlying bowel gas. Visualized portions unremarkable. No ductal dilatation. Spleen: Size and appearance within normal limits. Right Kidney: Length: 12.3 cm. Shadowing stone in the lower pole. No hydronephrosis. Normal echotexture. Left Kidney: Length: 12.9 cm. Echogenicity within normal limits. No mass or hydronephrosis visualized. Abdominal aorta: No aneurysm visualized. Other findings: None. IMPRESSION: Cholelithiasis with numerous gallstones. No sonographic evidence of acute cholecystitis. Fatty liver. Right lower pole nephrolithiasis. Electronically Signed   By: Rolm Baptise M.D.   On: 08/17/2017 11:11   Dg Abd 2  Views  Result Date: 08/15/2017 CLINICAL DATA:  Abdominal pain. EXAM: ABDOMEN -  2 VIEW COMPARISON:  September 23, 2015 FINDINGS: Mild fecal loading in the colon. Stones remain in the lower right kidney. No bowel obstruction. No free air, portal venous gas, or pneumatosis. No other acute abnormalities. IMPRESSION: Mild fecal loading in the colon.  Right renal stones. Electronically Signed   By: Dorise Bullion III M.D   On: 08/15/2017 21:06   Anti-infectives (From admission, onward)   None        Assessment/Plan H/o DVT - holding eliquis (last dose 6/12) S/p PPM 04/2017 OSA - noncompliant with CPAP HLD  Pancreatitis - suspect 2/2 choledocholithiasis  - management per primary, patient currently on high rate IVF and clear liquids - repeat CMET and lipase in AM  - GI planning to perform EUS and possible ERCP tomorrow - we will follow these results - I think that patient would benefit from laparoscopic cholecystectomy to prevent this from occurring again, will discuss timing of surgery with MD   FEN: IVF, clear liquid diet, NPO after MN ID: none currently VTE: SCD's, chemical VTE held for procedure  Foley: none      Wellington Hampshire, [facility] Surgery 08/17/2017, 3:42 PM Pager: [phone] Consults: [phone] Mon 7:00 am -11:30 AM Tues-Fri 7:00 am-4:30 pm Sat-Sun 7:00 am-11:30 am

## 2017-08-17 NOTE — ED Provider Notes (Signed)
[name]   CSN: [id number] Arrival date & time: 08/17/17  0254     History   Chief Complaint Chief Complaint  Patient presents with  . Abdominal Pain    HPI [name] is a 77 y.o. male.  The history is provided by the patient.  He has history of hypertension, hyperlipidemia, renal insufficiency, pacemaker and comes in because of abdominal pain which started about 3 PM.  Pain is in the periumbilical area but does move up to the epigastric area and down to the suprapubic area.  There is occasional radiation to the back.  There is associated nausea but no vomiting.  He denies constipation or diarrhea.  He took a dose of MiraLAX, but with no benefit.  He had similar pain last week which did seem to get better following taking MiraLAX.  He denies fever, chills, sweats.  Pain is rated at 10/10.  Nothing seems to make it better, nothing makes it worse.  Also, patient has noted that his urine has been looking yellow for the last 2 weeks.  Past Medical History:  Diagnosis Date  . Arthritis    "probably; hands" (04/12/2017)  . High cholesterol   . History of gout   . Hypertension   . OSA (obstructive sleep apnea)    per pt noncompliant CPAP has not use it since 2015  . Presence of permanent cardiac pacemaker 04/12/2017   Dual Chamber  . Wears dentures    upper    Patient Active Problem List   Diagnosis Date Noted  . Pacemaker 04/12/2017  . Syncope and collapse 01/23/2017  . Second degree atrioventricular block, Mobitz (type) I 01/23/2017  . Symptomatic bradycardia 01/23/2017  . SVT (supraventricular tachycardia) ([place]) 01/23/2017  . Other fatigue 01/23/2017  . Chest pain 12/28/2016  . Essential hypertension 12/28/2016  . BRBPR (bright red blood per rectum) 11/04/2010  . Mild renal insufficiency 11/04/2010  . Sleep apnea 11/04/2010    Past Surgical History:  Procedure Laterality Date  . ELBOW BURSA SURGERY Left  07-21-2009   extensive bursectomy/ tenosynovectomy/ ulnar nerve release  . HAMMER TOE SURGERY Right 2015  . HAMMER TOE SURGERY Right 12/11/2014   Procedure: 4th HAMMER TOE CORRECTION, EXCISION LESION 3rd TOE RIGHT FOOT;  Surgeon: Rosemary Holms, DPM;  Location: [place];  Service: Podiatry;  Laterality: Right;  . INSERT / REPLACE / REMOVE PACEMAKER  04/12/2017   Dual Chamber  . MICROLARYNGOSCOPY  06-07-2006   w/  Excision bilateral vocal cord lesions (bilateral benign nodules)  . PACEMAKER IMPLANT N/A 04/12/2017   Procedure: PACEMAKER IMPLANT - Dual Chamber;  Surgeon: Sanda Klein, MD;  Location: [place] [facility];  Service: Cardiovascular;  Laterality: N/A;        Home Medications    Prior to Admission medications   Medication Sig Start Date End Date Taking? Authorizing Provider  aspirin EC 81 MG tablet Take 81 mg by mouth daily.      [provider]  atorvastatin (LIPITOR) 20 MG tablet Take 20 mg by mouth every morning.    [provider]  ELIQUIS 5 MG TABS tablet 1 TABLET TWICE A DAY 30 DAYS 07/04/17   [provider]  losartan (COZAAR) 100 MG tablet Take 100 mg by mouth daily.    [provider]  Multiple Vitamins-Minerals (MULTIVITAMINS THER. W/MINERALS) TABS Take 1 tablet by mouth daily.      [provider]  nitroGLYCERIN (NITROSTAT) 0.4 MG SL  tablet Place 1 tablet (0.4 mg total) under the tongue every 5 (five) minutes as needed for chest pain. 12/29/16   [name], MD  potassium chloride SA (K-DUR,KLOR-CON) 20 MEQ tablet Take 20 mEq by mouth every morning.     [provider]  simethicone (MYLICON) 40 TW/6.5KC drops Take 0.6 mLs (40 mg total) by mouth 4 (four) times daily as needed for flatulence. 08/15/17   Zigmund Gottron, NP  tamsulosin (FLOMAX) 0.4 MG CAPS capsule TAKE 1 CAPSULE BY MOUTH EVERYDAY AT BEDTIME 07/09/17   [provider]    Family History Family History  Problem Relation Age  of Onset  . Stroke Mother 84       had gangrene of toe, then later "stroke" per patient  . Osteoarthritis Father   . Heart attack Father 29  . Heart attack Sister        sudden cardiac arrest  . Colon cancer Neg Hx     Social History Social History   Tobacco Use  . Smoking status: Never Smoker  . Smokeless tobacco: Never Used  Substance Use Topics  . Alcohol use: Yes    Alcohol/week: 1.2 oz    Types: 2 Standard drinks or equivalent per week  . Drug use: No     Allergies   Patient has no known allergies.   Review of Systems Review of Systems  All other systems reviewed and are negative.    Physical Exam Updated Vital Signs BP (!) 179/85 (BP Location: Left Arm)   Pulse 65   Temp 98.5 F (36.9 C) (Oral)   Resp 16   SpO2 97%   Physical Exam  Nursing [name] and vitals reviewed.  77 year old male, resting comfortably and in no acute distress. Vital signs are significant for elevated systolic blood pressure. Oxygen saturation is 97%, which is normal. Head is normocephalic and atraumatic. PERRLA, EOMI. Scleral icterus present.  Oropharynx is clear. Neck is nontender and supple without adenopathy or JVD. Back is nontender and there is no CVA tenderness. Lungs are clear without rales, wheezes, or rhonchi. Chest is nontender. Heart has regular rate and rhythm without murmur. Abdomen is soft, flat, with mild epigastric tenderness.  There is no rebound or guarding.  There are no masses or hepatosplenomegaly and peristalsis is hypoactive. Extremities have no cyanosis or edema, full range of motion is present. Skin is warm and dry without rash. Neurologic: Mental status is normal, cranial nerves are intact, there are no motor or sensory deficits.  ED Treatments / Results  Labs (all labs ordered are listed, but only abnormal results are displayed) Labs Reviewed  COMPREHENSIVE METABOLIC PANEL - Abnormal; Notable for the following components:      Result Value   Glucose,  Bld 112 (*)    Calcium 8.4 (*)    AST 177 (*)    ALT 279 (*)    Alkaline Phosphatase 284 (*)    Total Bilirubin 7.7 (*)    All other components within normal limits  LIPASE, BLOOD - Abnormal; Notable for the following components:   Lipase 3,586 (*)    All other components within normal limits  CBC WITH DIFFERENTIAL/PLATELET - Abnormal; Notable for the following components:   Monocytes Absolute 1.1 (*)    All other components within normal limits  URINALYSIS, ROUTINE W REFLEX MICROSCOPIC - Abnormal; Notable for the following components:   Color, Urine AMBER (*)    Hgb urine dipstick LARGE (*)    Protein, ur 30 (*)  RBC / HPF >50 (*)    All other components within normal limits    Radiology Dg Abd 2 Views  Result Date: 08/15/2017 CLINICAL DATA:  Abdominal pain. EXAM: ABDOMEN - 2 VIEW COMPARISON:  September 23, 2015 FINDINGS: Mild fecal loading in the colon. Stones remain in the lower right kidney. No bowel obstruction. No free air, portal venous gas, or pneumatosis. No other acute abnormalities. IMPRESSION: Mild fecal loading in the colon.  Right renal stones. Electronically Signed   By: Dorise Bullion III M.D   On: 08/15/2017 21:06    Procedures Procedures   Medications Ordered in ED Medications  atorvastatin (LIPITOR) tablet 20 mg (has no administration in time range)  multivitamins ther. w/minerals tablet 1 tablet (has no administration in time range)  ondansetron (ZOFRAN) injection 4 mg (4 mg Intravenous Given 08/17/17 0419)  morphine 4 MG/ML injection 4 mg (4 mg Intravenous Given 08/17/17 0420)     Initial Impression / Assessment and Plan / ED Course  I have reviewed the triage vital signs and the nursing notes.  Pertinent labs & imaging results that were available during my care of the patient were reviewed by me and considered in my medical decision making (see chart for details).  Abdominal pain of uncertain cause.  Old records reviewed, and he did have an abdominal CT  scan in 2016 showing no acute process, no evidence of aneurysm.  Urgent care visit 2 days ago had abdominal x-ray suggesting constipation, show he was prescribed MiraLAX and Gas-X.  No labs were obtained at that time.  Will check screening labs today.  At this point, we will hold off on advanced imaging.  Labs are significant for probable obstructive jaundice with bilirubin 7.7, alkaline phosphatase 284, also elevations of AST and ALT.  Lipase is massively elevated to over 3000.  Consider possibility of gallstone pancreatitis, consider primary pancreatic malignancy.  Ultrasound has been ordered, case is discussed with Dr. Alcario Drought of [facility], who agrees to admit the patient.  Final Clinical Impressions(s) / ED Diagnoses   Final diagnoses:  Obstructive jaundice  Acute pancreatitis without infection or necrosis, unspecified pancreatitis type    ED Discharge Orders    None       Delora Fuel, MD 16/10/96 [phone]

## 2017-08-17 NOTE — H&P (View-Only) (Signed)
Referring Provider: Dr. Quincy Simmonds, [facility] Primary Care Physician:  Merrilee Seashore, MD Primary Gastroenterologist:  Dr. Sharlett Iles  Reason for Consultation:  Pancreatitis and elevated LFT's  HPI: [name] is a 77 y.o. male with medical history significant of HTN, PPM placement in Jan, DVT in March now on eliquis.  Patient presented to the ED with c/o abd pain.  Symptoms onset at ~3pm, but reports that he had been experiencing some pain on and off for a week.  Pain periumbilical area and epigastric area.  Radiates to back.  Nausea, no vomiting.  Last BM was yesterday AM prior to coming to the ED.  Pain severe, 10/10.  ED Course: Lipase 3500.  AST 177, ALT 279, and ALK 284.  Bili 7.7.  US abdomen showed the following:  IMPRESSION: Cholelithiasis with numerous gallstones. No sonographic evidence of acute cholecystitis.  Fatty liver.  Right lower pole nephrolithiasis.  Last dose of Eliquis was evening of 6/12.   Past Medical History:  Diagnosis Date  . Arthritis    "probably; hands" (04/12/2017)  . High cholesterol   . History of gout   . Hypertension   . OSA (obstructive sleep apnea)    per pt noncompliant CPAP has not use it since 2015  . Presence of permanent cardiac pacemaker 04/12/2017   Dual Chamber  . Wears dentures    upper    Past Surgical History:  Procedure Laterality Date  . ELBOW BURSA SURGERY Left 07-21-2009   extensive bursectomy/ tenosynovectomy/ ulnar nerve release  . HAMMER TOE SURGERY Right 2015  . HAMMER TOE SURGERY Right 12/11/2014   Procedure: 4th HAMMER TOE CORRECTION, EXCISION LESION 3rd TOE RIGHT FOOT;  Surgeon: Rosemary Holms, DPM;  Location: [facility];  Service: Podiatry;  Laterality: Right;  . INSERT / REPLACE / REMOVE PACEMAKER  04/12/2017   Dual Chamber  . MICROLARYNGOSCOPY  06-07-2006   w/  Excision bilateral vocal cord lesions (bilateral benign nodules)  . PACEMAKER IMPLANT N/A 04/12/2017   Procedure: PACEMAKER  IMPLANT - Dual Chamber;  Surgeon: Sanda Klein, MD;  Location: [place] [facility];  Service: Cardiovascular;  Laterality: N/A;    Prior to Admission medications   Medication Sig Start Date End Date Taking? Authorizing Provider  atorvastatin (LIPITOR) 20 MG tablet Take 20 mg by mouth every morning.   Yes [provider]  ELIQUIS 5 MG TABS tablet 1 TABLET TWICE A DAY 30 DAYS 07/04/17  Yes [provider]  losartan (COZAAR) 100 MG tablet Take 100 mg by mouth daily.   Yes [provider]  Multiple Vitamins-Minerals (MULTIVITAMINS THER. W/MINERALS) TABS Take 1 tablet by mouth daily.     Yes [provider]  potassium chloride SA (K-DUR,KLOR-CON) 20 MEQ tablet Take 20 mEq by mouth every morning.    Yes [provider]  nitroGLYCERIN (NITROSTAT) 0.4 MG SL tablet Place 1 tablet (0.4 mg total) under the tongue every 5 (five) minutes as needed for chest pain. 12/29/16   [name], MD    Current Facility-Administered Medications  Medication Dose Route Frequency Provider Last Rate Last Dose  . 0.9 %  sodium chloride infusion   Intravenous Continuous Etta Quill, DO 125 mL/hr at 08/17/17 8850    . acetaminophen (TYLENOL) tablet 650 mg  650 mg Oral Q6H PRN Etta Quill, DO       Or  . acetaminophen (TYLENOL) suppository 650 mg  650 mg Rectal Q6H PRN Etta Quill, DO      .  atorvastatin (LIPITOR) tablet 20 mg  20 mg Oral q1800 Etta Quill, DO      . morphine 2 MG/ML injection 2-4 mg  2-4 mg Intravenous Q4H PRN Etta Quill, DO      . multivitamin with minerals tablet 1 tablet  1 tablet Oral Daily Jennette Kettle M, DO      . ondansetron [facility]) tablet 4 mg  4 mg Oral Q6H PRN Etta Quill, DO       Or  . ondansetron [facility]) injection 4 mg  4 mg Intravenous Q6H PRN Etta Quill, DO        Allergies as of 08/17/2017  . (No Known Allergies)    Family History  Problem Relation Age of Onset  . Stroke Mother 83       had  gangrene of toe, then later "stroke" per patient  . Osteoarthritis Father   . Heart attack Father 66  . Heart attack Sister        sudden cardiac arrest  . Colon cancer Neg Hx     Social History   Socioeconomic History  . Marital status: Married    Spouse name: Not on file  . Number of children: Not on file  . Years of education: Not on file  . Highest education level: Not on file  Occupational History  . Not on file  Social Needs  . Financial resource strain: Not on file  . Food insecurity:    Worry: Not on file    Inability: Not on file  . Transportation needs:    Medical: Not on file    Non-medical: Not on file  Tobacco Use  . Smoking status: Never Smoker  . Smokeless tobacco: Never Used  Substance and Sexual Activity  . Alcohol use: Yes    Alcohol/week: 1.2 oz    Types: 2 Standard drinks or equivalent per week  . Drug use: No  . Sexual activity: Yes  Lifestyle  . Physical activity:    Days per week: Not on file    Minutes per session: Not on file  . Stress: Not on file  Relationships  . Social connections:    Talks on phone: Not on file    Gets together: Not on file    Attends religious service: Not on file    Active member of club or organization: Not on file    Attends meetings of clubs or organizations: Not on file    Relationship status: Not on file  . Intimate partner violence:    Fear of current or ex partner: Not on file    Emotionally abused: Not on file    Physically abused: Not on file    Forced sexual activity: Not on file  Other Topics Concern  . Not on file  Social History Narrative  . Not on file    Review of Systems: ROS is O/W negative except as mentioned in HPI.  Physical Exam: Vital signs in last 24 hours: Temp:  [98.3 F (36.8 C)-98.5 F (36.9 C)] 98.3 F (36.8 C) (06/13 0739) Pulse Rate:  [61-65] 65 (06/13 0739) Resp:  [15-16] 15 (06/13 0739) BP: (162-179)/(83-89) 162/89 (06/13 0739) SpO2:  [97 %-99 %] 99 % (06/13  0739) Weight:  [242 lb (109.8 kg)] 242 lb (109.8 kg) (06/13 0420)   General:  Alert, Well-developed, well-nourished, pleasant and cooperative in NAD Head:  Normocephalic and atraumatic. Eyes:  Scleral icterus noted. Ears:  Normal auditory acuity. Mouth:  No deformity  or lesions.   Lungs:  Clear throughout to auscultation.  No wheezes, crackles, or rhonchi.  Heart:  Regular rate and rhythm; no murmurs, clicks, rubs, or gallops. Abdomen:  Soft, non-distended.  BS present but quiet.  Very mild epigastric TTP.  Msk:  Symmetrical without gross deformities. Pulses:  Normal pulses noted. Extremities:  Without clubbing or edema. Neurologic:  Alert and oriented x 4;  grossly normal neurologically. Skin:  Intact without significant lesions or rashes. Psych:  Alert and cooperative. Normal mood and affect.  Lab Results: Recent Labs    08/17/17 0408  WBC 9.9  HGB 14.7  HCT 43.3  PLT 189   BMET Recent Labs    08/17/17 0408  NA 138  K 4.0  CL 101  CO2 25  GLUCOSE 112*  BUN 15  CREATININE 0.79  CALCIUM 8.4*   LFT Recent Labs    08/17/17 0408  PROT 6.9  ALBUMIN 3.5  AST 177*  ALT 279*  ALKPHOS 284*  BILITOT 7.7*   Studies/Results: Dg Abd 2 Views  Result Date: 08/15/2017 CLINICAL DATA:  Abdominal pain. EXAM: ABDOMEN - 2 VIEW COMPARISON:  September 23, 2015 FINDINGS: Mild fecal loading in the colon. Stones remain in the lower right kidney. No bowel obstruction. No free air, portal venous gas, or pneumatosis. No other acute abnormalities. IMPRESSION: Mild fecal loading in the colon.  Right renal stones. Electronically Signed   By: Dorise Bullion III M.D   On: 08/15/2017 21:06   IMPRESSION:  *77 year old male with what appears to be biliary pancreatitis.  LFT's significantly elevated and ultrasound showed gallstones, but CBD actually normal.   PLAN: *We are going to tentatively plan for ERCP and possible EUS on 6/12. *Will recheck LFT's in AM. *Already spoke to surgery who will  see the patient as well as he will likely need cholecystectomy at some point. *Eliquis needs to be held and if it is determined that he needs anticoagulation then heparin would need to be held for 4-5 hours prior.   Laban Emperor. Tharun Cappella  08/17/2017, 9:08 AM

## 2017-08-17 NOTE — ED Notes (Signed)
ED TO INPATIENT HANDOFF REPORT  Name/Age/Gender [name] 77 y.o. male  Code Status    Code Status Orders  (From admission, onward)        Start     Ordered   08/17/17 0619  Full code  Continuous     08/17/17 0623    Code Status History    Date Active Date Inactive Code Status Order ID Comments User Context   04/12/2017 1627 04/13/2017 1635 Full Code [id number]  Sanda Klein, MD Inpatient   12/28/2016 2010 12/29/2016 2124 Full Code [id number]  Rise Patience, MD ED   12/11/2014 1007 12/11/2014 1648 Full Code [id number]  Rosemary Holms, DPM Inpatient      Home/SNF/Other Home  Chief Complaint abdominal pain   Level of Care/Admitting Diagnosis ED Disposition    ED Disposition Condition [facility] Area: [facility] [[id number]]  Level of Care: Med-Surg [16]  Diagnosis: Acute biliary pancreatitis [[id number]]  Admitting Physician: Etta Quill [[id number]]  Attending Physician: Etta Quill [[id number]]  Estimated length of stay: past midnight tomorrow  Certification:: I certify this patient will need inpatient services for at least 2 midnights  PT Class (Do Not Modify): Inpatient [101]  PT Acc Code (Do Not Modify): Private [1]       Medical History Past Medical History:  Diagnosis Date  . Arthritis    "probably; hands" (04/12/2017)  . High cholesterol   . History of gout   . Hypertension   . OSA (obstructive sleep apnea)    per pt noncompliant CPAP has not use it since 2015  . Presence of permanent cardiac pacemaker 04/12/2017   Dual Chamber  . Wears dentures    upper    Allergies No Known Allergies  IV Location/Drains/Wounds Patient Lines/Drains/Airways Status   Active Line/Drains/Airways    Name:   Placement date:   Placement time:   Site:   Days:   Peripheral IV 08/17/17 Right Antecubital   08/17/17    0426    Antecubital   less than 1          Labs/Imaging Results for orders placed or performed during the  hospital encounter of 08/17/17 (from the past 48 hour(s))  Urinalysis, Routine w reflex microscopic     Status: Abnormal   Collection Time: 08/17/17  4:00 AM  Result Value Ref Range   Color, Urine AMBER (A) YELLOW    Comment: BIOCHEMICALS MAY BE AFFECTED BY COLOR   APPearance CLEAR CLEAR   Specific Gravity, Urine 1.017 1.005 - 1.030   pH 8.0 5.0 - 8.0   Glucose, UA NEGATIVE NEGATIVE mg/dL   Hgb urine dipstick LARGE (A) NEGATIVE   Bilirubin Urine NEGATIVE NEGATIVE   Ketones, ur NEGATIVE NEGATIVE mg/dL   Protein, ur 30 (A) NEGATIVE mg/dL   Nitrite NEGATIVE NEGATIVE   Leukocytes, UA NEGATIVE NEGATIVE   RBC / HPF >50 (H) 0 - 5 RBC/hpf   WBC, UA 0-5 0 - 5 WBC/hpf   Bacteria, UA NONE SEEN NONE SEEN   Mucus PRESENT     Comment: Performed at [facility], [place] [address] [place], [place] [address]  Comprehensive metabolic panel     Status: Abnormal   Collection Time: 08/17/17  4:08 AM  Result Value Ref Range   Sodium 138 135 - 145 mmol/L   Potassium 4.0 3.5 - 5.1 mmol/L   Chloride 101 101 - 111 mmol/L   CO2 25 22 - 32 mmol/L  Glucose, Bld 112 (H) 65 - 99 mg/dL   BUN 15 6 - 20 mg/dL   Creatinine, Ser 0.79 0.61 - 1.24 mg/dL   Calcium 8.4 (L) 8.9 - 10.3 mg/dL   Total Protein 6.9 6.5 - 8.1 g/dL   Albumin 3.5 3.5 - 5.0 g/dL   AST 177 (H) 15 - 41 U/L   ALT 279 (H) 17 - 63 U/L   Alkaline Phosphatase 284 (H) 38 - 126 U/L   Total Bilirubin 7.7 (H) 0.3 - 1.2 mg/dL   GFR calc non Af Amer >60 >60 mL/min   GFR calc Af Amer >60 >60 mL/min    Comment: (NOTE) The eGFR has been calculated using the CKD EPI equation. This calculation has not been validated in all clinical situations. eGFR's persistently <60 mL/min signify possible Chronic Kidney Disease.    Anion gap 12 5 - 15    Comment: Performed at [facility], [place] [address], [place], [place] [address]  Lipase, blood     Status: Abnormal   Collection Time: 08/17/17  4:08 AM  Result Value Ref Range    Lipase 3,586 (H) 11 - 51 U/L    Comment: RESULTS CONFIRMED BY MANUAL DILUTION Performed at [facility], [place] [address], [place], [place] [address]   CBC with Differential     Status: Abnormal   Collection Time: 08/17/17  4:08 AM  Result Value Ref Range   WBC 9.9 4.0 - 10.5 K/uL   RBC 4.77 4.22 - 5.81 MIL/uL   Hemoglobin 14.7 13.0 - 17.0 g/dL   HCT 43.3 39.0 - 52.0 %   MCV 90.8 78.0 - 100.0 fL   MCH 30.8 26.0 - 34.0 pg   MCHC 33.9 30.0 - 36.0 g/dL   RDW 14.7 11.5 - 15.5 %   Platelets 189 150 - 400 K/uL   Neutrophils Relative % 77 %   Neutro Abs 7.7 1.7 - 7.7 K/uL   Lymphocytes Relative 11 %   Lymphs Abs 1.1 0.7 - 4.0 K/uL   Monocytes Relative 11 %   Monocytes Absolute 1.1 (H) 0.1 - 1.0 K/uL   Eosinophils Relative 1 %   Eosinophils Absolute 0.1 0.0 - 0.7 K/uL   Basophils Relative 0 %   Basophils Absolute 0.0 0.0 - 0.1 K/uL    Comment: Performed at [facility], [place] [address], [place], [place] [address]   Dg Abd 2 Views  Result Date: 08/15/2017 CLINICAL DATA:  Abdominal pain. EXAM: ABDOMEN - 2 VIEW COMPARISON:  September 23, 2015 FINDINGS: Mild fecal loading in the colon. Stones remain in the lower right kidney. No bowel obstruction. No free air, portal venous gas, or pneumatosis. No other acute abnormalities. IMPRESSION: Mild fecal loading in the colon.  Right renal stones. Electronically Signed   By: Dorise Bullion III M.D   On: 08/15/2017 21:06    Pending Labs Unresulted Labs (From admission, onward)   Start     Ordered   08/18/17 0500  CBC  Tomorrow morning,   R     08/17/17 0623   08/18/17 0500  Comprehensive metabolic panel  Tomorrow morning,   R     08/17/17 0623      Vitals/Pain Today's Vitals   08/17/17 0317 08/17/17 0420 08/17/17 0607 08/17/17 0608  BP: (!) 179/85  (!) 178/83   Pulse: 65  61   Resp: 16  16   Temp: 98.5 F (36.9 C)  98.5 F (36.9 C)   TempSrc:  Oral  Oral   SpO2: 97%  98%   Weight:  242 lb (109.8 kg)     Height:  '5\' 11"'  (1.803 m)    PainSc:   3  3     Isolation Precautions No active isolations  Medications Medications  atorvastatin (LIPITOR) tablet 20 mg (has no administration in time range)  multivitamin with minerals tablet 1 tablet (has no administration in time range)  acetaminophen (TYLENOL) tablet 650 mg (has no administration in time range)    Or  acetaminophen (TYLENOL) suppository 650 mg (has no administration in time range)  ondansetron (ZOFRAN) tablet 4 mg (has no administration in time range)    Or  ondansetron (ZOFRAN) injection 4 mg (has no administration in time range)  0.9 %  sodium chloride infusion (has no administration in time range)  morphine 2 MG/ML injection 2-4 mg (has no administration in time range)  ondansetron (ZOFRAN) injection 4 mg (4 mg Intravenous Given 08/17/17 0419)  morphine 4 MG/ML injection 4 mg (4 mg Intravenous Given 08/17/17 0420)    Mobility walks

## 2017-08-18 ENCOUNTER — Encounter (HOSPITAL_COMMUNITY)
Admission: EM | Disposition: A | Discharge status: Home / Self Care | Payer: Self-pay | Source: Home / Self Care | Attending: Family Medicine

## 2017-08-18 ENCOUNTER — Inpatient Hospital Stay (HOSPITAL_COMMUNITY): Payer: Medicare HMO | Admitting: Registered Nurse

## 2017-08-18 ENCOUNTER — Encounter (HOSPITAL_COMMUNITY): Payer: Self-pay | Admitting: Registered Nurse

## 2017-08-18 DIAGNOSIS — I10 Essential (primary) hypertension: Secondary | ICD-10-CM

## 2017-08-18 DIAGNOSIS — Z950 Presence of cardiac pacemaker: Secondary | ICD-10-CM

## 2017-08-18 DIAGNOSIS — R748 Abnormal levels of other serum enzymes: Secondary | ICD-10-CM

## 2017-08-18 HISTORY — PX: EUS: SHX5427

## 2017-08-18 LAB — COMPREHENSIVE METABOLIC PANEL
ALT: 212 U/L — AB (ref 17–63)
ANION GAP: 6 (ref 5–15)
AST: 122 U/L — ABNORMAL HIGH (ref 15–41)
Albumin: 3.1 g/dL — ABNORMAL LOW (ref 3.5–5.0)
Alkaline Phosphatase: 303 U/L — ABNORMAL HIGH (ref 38–126)
BUN: 14 mg/dL (ref 6–20)
CHLORIDE: 103 mmol/L (ref 101–111)
CO2: 26 mmol/L (ref 22–32)
Calcium: 8.1 mg/dL — ABNORMAL LOW (ref 8.9–10.3)
Creatinine, Ser: 0.97 mg/dL (ref 0.61–1.24)
GFR calc non Af Amer: >60 mL/min (ref >60)
Glucose, Bld: 91 mg/dL (ref 65–99)
Potassium: 4.4 mmol/L (ref 3.5–5.1)
SODIUM: 135 mmol/L (ref 135–145)
Total Bilirubin: 3.8 mg/dL — ABNORMAL HIGH (ref 0.3–1.2)
Total Protein: 6.7 g/dL (ref 6.5–8.1)

## 2017-08-18 LAB — CBC
HCT: 41 % (ref 39.0–52.0)
HEMOGLOBIN: 13.9 g/dL (ref 13.0–17.0)
MCH: 30.6 pg (ref 26.0–34.0)
MCHC: 33.9 g/dL (ref 30.0–36.0)
MCV: 90.3 fL (ref 78.0–100.0)
PLATELETS: 180 10*3/uL (ref 150–400)
RBC: 4.54 MIL/uL (ref 4.22–5.81)
RDW: 15 % (ref 11.5–15.5)
WBC: 12.7 10*3/uL — ABNORMAL HIGH (ref 4.0–10.5)

## 2017-08-18 LAB — LIPASE, BLOOD: Lipase: 383 U/L — ABNORMAL HIGH (ref 11–51)

## 2017-08-18 SURGERY — ESOPHAGEAL ENDOSCOPIC ULTRASOUND (EUS) RADIAL
Anesthesia: Monitor Anesthesia Care

## 2017-08-18 MED ORDER — GLUCAGON HCL RDNA (DIAGNOSTIC) 1 MG IJ SOLR
INTRAMUSCULAR | Status: AC
  Filled 2017-08-18: qty 1

## 2017-08-18 MED ORDER — PROPOFOL 10 MG/ML IV BOLUS
INTRAVENOUS | Status: DC | PRN
  Administered 2017-08-18: 20 mg via INTRAVENOUS
  Administered 2017-08-18: 30 mg via INTRAVENOUS
  Administered 2017-08-18: 20 mg via INTRAVENOUS
  Administered 2017-08-18 (×3): 30 mg via INTRAVENOUS
  Administered 2017-08-18 (×3): 20 mg via INTRAVENOUS

## 2017-08-18 MED ORDER — ONDANSETRON HCL 4 MG/2ML IJ SOLN
INTRAMUSCULAR | Status: DC | PRN
  Administered 2017-08-18: 4 mg via INTRAVENOUS

## 2017-08-18 MED ORDER — HYDROMORPHONE HCL 1 MG/ML IJ SOLN: 0.2500 mg | INTRAMUSCULAR | Status: DC | PRN

## 2017-08-18 MED ORDER — SODIUM CHLORIDE 0.9 % IV SOLN: 2.0000 g | INTRAVENOUS | Status: DC

## 2017-08-18 MED ORDER — ONDANSETRON HCL 4 MG/2ML IJ SOLN: 4.0000 mg | Freq: Once | INTRAMUSCULAR | Status: DC | PRN

## 2017-08-18 MED ORDER — LACTATED RINGERS IV SOLN
INTRAVENOUS | Status: DC
  Administered 2017-08-18: 1000 mL via INTRAVENOUS

## 2017-08-18 MED ORDER — FENTANYL CITRATE (PF) 250 MCG/5ML IJ SOLN
INTRAMUSCULAR | Status: AC
  Filled 2017-08-18: qty 5

## 2017-08-18 MED ORDER — PROPOFOL 10 MG/ML IV BOLUS
INTRAVENOUS | Status: AC
  Filled 2017-08-18: qty 20

## 2017-08-18 MED ORDER — MEPERIDINE HCL 25 MG/ML IJ SOLN
6.2500 mg | INTRAMUSCULAR | Status: DC | PRN
  Filled 2017-08-18: qty 1

## 2017-08-18 MED ORDER — INDOMETHACIN 50 MG RE SUPP
RECTAL | Status: AC
  Filled 2017-08-18: qty 2

## 2017-08-18 MED ORDER — LIDOCAINE 2% (20 MG/ML) 5 ML SYRINGE
INTRAMUSCULAR | Status: DC | PRN
  Administered 2017-08-18: 60 mg via INTRAVENOUS

## 2017-08-18 MED ORDER — SODIUM CHLORIDE 0.9 % IV SOLN
2.0000 g | INTRAVENOUS | Status: AC
  Administered 2017-08-19: 2 g via INTRAVENOUS
  Filled 2017-08-18: qty 20

## 2017-08-18 NOTE — Anesthesia Postprocedure Evaluation (Signed)
Anesthesia Post Note  Patient: [name]  Procedure(s) Performed: ESOPHAGEAL ENDOSCOPIC ULTRASOUND (EUS) RADIAL (N/A )     Patient location during evaluation: PACU Anesthesia Type: General Level of consciousness: awake and alert Pain management: pain level controlled Vital Signs Assessment: post-procedure vital signs reviewed and stable Respiratory status: spontaneous breathing, nonlabored ventilation, respiratory function stable and patient connected to nasal cannula oxygen Cardiovascular status: stable and blood pressure returned to baseline Postop Assessment: no apparent nausea or vomiting Anesthetic complications: no    Last Vitals:  Vitals:   08/18/17 0807 08/18/17 0812  BP: (!) 141/68 (!) 166/77  Pulse: 62 63  Resp: 16 16  Temp:    SpO2: 96% 94%    Last Pain:  Vitals:   08/18/17 0812  TempSrc:   PainSc: 0-No pain                 [name] DAVID

## 2017-08-18 NOTE — Op Note (Signed)
[facility] Patient Name: [name] Procedure Date: 08/18/2017 MRN: [id number] Attending MD: Milus Banister , MD Date of Birth: 1941-02-26 CSN: [id number] Age: 77 Admit Type: Inpatient Procedure:                Upper EUS Indications:              Acute pancreatitis, stones in GB, elevated liver                            tests; CBD 55mm; clinical concern for                            choledocholiathisis Providers:                Milus Banister, MD, Cleda Daub, RN, William Dalton, Technician Referring MD:              Medicines:                Monitored Anesthesia Care Complications:            No immediate complications. Estimated blood loss:                            None. Estimated Blood Loss:     Estimated blood loss: none. Procedure:                Pre-Anesthesia Assessment:                           - Prior to the procedure, a History and Physical                            was performed, and patient medications and                            allergies were reviewed. The patient's tolerance of                            previous anesthesia was also reviewed. The risks                            and benefits of the procedure and the sedation                            options and risks were discussed with the patient.                            All questions were answered, and informed consent                            was obtained. Prior Anticoagulants: The patient has                            taken Eliquis (apixaban), last dose was  2 days                            prior to procedure. ASA Grade Assessment: III - A                            patient with severe systemic disease. After                            reviewing the risks and benefits, the patient was                            deemed in satisfactory condition to undergo the                            procedure.                           After obtaining  informed consent, the endoscope was                            passed under direct vision. Throughout the                            procedure, the patient's blood pressure, pulse, and                            oxygen saturations were monitored continuously. The                            [id number] ([id number]) scope was introduced through                            the mouth, and advanced to the second part of                            duodenum. The upper EUS was accomplished without                            difficulty. The patient tolerated the procedure                            well. Scope In: Scope Out: Findings:      ENDOSCOPIC FINDING: :      1. The UGI tract was normal; limited views with radial echoendoscope.      ENDOSONOGRAPHIC FINDING: :      1. CBD was 47mm in maximal dilation. There were no stones or obvious       debris in the extrahepatic bile ducts. The walls of the bile duct were       diffusely thickened (1-49mm).      2. Several shadowing gallstones in the gallbladder.      3. Pancreatic parenchyma is edematous throughout the gland (patchy       hypoechoic regions throughout).      4. No peripancreatic adenopathy.      5. Limited  views of the liver, spleen, portal and splenic vessels were       all normal. Impression:               - No choledocholiathisis.                           - +Cholelithiasis.                           - Interstitial pancreatic edema throughout,                            consistent with acute pancreatitis. Moderate Sedation:      N/A- Per Anesthesia Care Recommendation:           - Return patient to hospital ward for ongoing care.                           - I discuss these findings with general surgery,                            will keep him NPO in case he agrees to                            cholecystectomy today. Procedure Code(s):        --- Professional ---                           [phone], Esophagogastroduodenoscopy, flexible,                             transoral; with endoscopic ultrasound examination                            limited to the esophagus, stomach or duodenum, and                            adjacent structures Diagnosis Code(s):        --- Professional ---                           R74.8, Abnormal levels of other serum enzymes CPT copyright 2017 [employer]. All rights reserved. The codes documented in this report are preliminary and upon coder review may  be revised to meet current compliance requirements. Milus Banister, MD 08/18/2017 7:50:50 AM This report has been signed electronically. Number of Addenda: 0

## 2017-08-18 NOTE — Anesthesia Preprocedure Evaluation (Signed)
Anesthesia Evaluation  Patient identified by MRN, date of birth, ID band Patient awake    Reviewed: Allergy & Precautions, NPO status , Patient's Chart, lab work & pertinent test results  Airway Mallampati: I  TM Distance: >3 FB Neck ROM: Full    Dental   Pulmonary sleep apnea ,    Pulmonary exam normal        Cardiovascular hypertension, Pt. on medications Normal cardiovascular exam+ dysrhythmias + pacemaker      Neuro/Psych    GI/Hepatic   Endo/Other    Renal/GU Renal InsufficiencyRenal disease     Musculoskeletal   Abdominal   Peds  Hematology   Anesthesia Other Findings   Reproductive/Obstetrics                             Anesthesia Physical Anesthesia Plan  ASA: III  Anesthesia Plan: General   Post-op Pain Management:    Induction: Intravenous  PONV Risk Score and Plan: 2 and Ondansetron, Treatment may vary due to age or medical condition and Midazolam  Airway Management Planned: Oral ETT  Additional Equipment:   Intra-op Plan:   Post-operative Plan: Extubation in OR  Informed Consent: I have reviewed the patients History and Physical, chart, labs and discussed the procedure including the risks, benefits and alternatives for the proposed anesthesia with the patient or authorized representative who has indicated his/her understanding and acceptance.     Plan Discussed with: CRNA and Surgeon  Anesthesia Plan Comments:         Anesthesia Quick Evaluation

## 2017-08-18 NOTE — Progress Notes (Signed)
PROGRESS NOTE [facility]   [name]   IFO:[id number] DOB: [birth year]  DOA: 08/17/2017 PCP: Merrilee Seashore, MD   Brief Narrative:  [name] is a 77 year old male with medical history significant for hypertension, PPM and DVT on Eliquis.  Patient presented to the emergency department complaining of abdominal pain.  Upon ED evaluation LFTs were elevated along with T bili 7.7, lipase >3500.  Patient was admitted with working diagnosis of acute pancreatitis.  Ultrasound of the abdomen reviewed cholelithiasis with numerous of gallstone, no evidence of acute cholecystitis.  Right lower pole nephrolithiasis.  GI was consulted and patient underwent upper EUS.  General surgery consult  Subjective: Patient seen and examined, pain is well controlled, EUS did not show stone on CBD.  Lipase and liver enzymes improving.  Tolerating clear liquid diet.  Assessment & Plan: Acute biliary pancreatitis Ultrasound with multiple gallstones, no stone on CBD. Improving clinically.  GI and general surgery recommendations appreciated. Patient will need cholecystectomy, he agrees to have surgical procedure.  Deferred to surgical team on timing.   Hypertension BP slightly elevated, likely related to IV fluid and not  getting losartan. Will resume losartan at home dose. Decrease IV fluids as patient is on clear liquids Continue to monitor   History of DVT diagnosed March 2019 Previously on Eliquis, holding in view of procedure No need for heparin drip at this time.    PPM due to symptomatic second-degree AV block Heart rate pacing well.  Stable  DVT prophylaxis: SCDs Code Status: Full code Family Communication: None at bedside Disposition Plan: Home when surgically cleared   Consultants:   GI  General surgery  Procedures:   EUS 6/14  Antimicrobials:  None   Objective: Vitals:   08/18/17 0757 08/18/17 0807 08/18/17 0812 08/18/17 1530  BP: 129/62 (!) 141/68 (!)  166/77 (!) 179/89  Pulse: 65 62 63 68  Resp: 14 16 16 18   Temp:    98.1 F (36.7 C)  TempSrc:    Oral  SpO2: 95% 96% 94% 100%  Weight:      Height:        Intake/Output Summary (Last 24 hours) at 08/18/2017 1617 Last data filed at 08/18/2017 0739 Gross per 24 hour  Intake 2666.67 ml  Output 0 ml  Net 2666.67 ml   Filed Weights   08/17/17 0420 08/17/17 1401 08/18/17 0658  Weight: 109.8 kg (242 lb) 109.8 kg (242 lb) 109.8 kg (242 lb)    Examination:  General exam: Appears calm and comfortable  HEENT: AC/AT, PERRLA, OP moist and clear Respiratory system: Clear to auscultation. No wheezes,crackle or rhonchi Cardiovascular system: S1 & S2 heard, RRR. No JVD, murmurs, rubs or gallops Gastrointestinal system: Soft, mild distended, nontender, positive bowel sounds Central nervous system: Alert and oriented. No focal neurological deficits. Extremities: No pedal edema. Skin: No rashes, lesions or ulcers Psychiatry: Mood & affect appropriate.    Data Reviewed: I have personally reviewed following labs and imaging studies  CBC: Recent Labs  Lab 08/17/17 0408 08/18/17 0402  WBC 9.9 12.7*  NEUTROABS 7.7  --   HGB 14.7 13.9  HCT 43.3 41.0  MCV 90.8 90.3  PLT 189 676   Basic Metabolic Panel: Recent Labs  Lab 08/17/17 0408 08/18/17 0402  NA 138 135  K 4.0 4.4  CL 101 103  CO2 25 26  GLUCOSE 112* 91  BUN 15 14  CREATININE 0.79 0.97  CALCIUM 8.4* 8.1*   GFR: Estimated Creatinine Clearance: 81.6 mL/min (  by C-G formula based on SCr of 0.97 mg/dL). Liver Function Tests: Recent Labs  Lab 08/17/17 0408 08/18/17 0402  AST 177* 122*  ALT 279* 212*  ALKPHOS 284* 303*  BILITOT 7.7* 3.8*  PROT 6.9 6.7  ALBUMIN 3.5 3.1*   Recent Labs  Lab 08/17/17 0408 08/18/17 0402  LIPASE 3,586* 383*   No results for input(s): AMMONIA in the last 168 hours. Coagulation Profile: No results for input(s): INR, PROTIME in the last 168 hours. Cardiac Enzymes: No results for  input(s): CKTOTAL, CKMB, CKMBINDEX, TROPONINI in the last 168 hours. BNP (last 3 results) No results for input(s): PROBNP in the last 8760 hours. HbA1C: No results for input(s): HGBA1C in the last 72 hours. CBG: No results for input(s): GLUCAP in the last 168 hours. Lipid Profile: No results for input(s): CHOL, HDL, LDLCALC, TRIG, CHOLHDL, LDLDIRECT in the last 72 hours. Thyroid Function Tests: No results for input(s): TSH, T4TOTAL, FREET4, T3FREE, THYROIDAB in the last 72 hours. Anemia Panel: No results for input(s): VITAMINB12, FOLATE, FERRITIN, TIBC, IRON, RETICCTPCT in the last 72 hours. Sepsis Labs: No results for input(s): PROCALCITON, LATICACIDVEN in the last 168 hours.  Recent Results (from the past 240 hour(s))  Urine culture     Status: None   Collection Time: 08/15/17  8:44 PM  Result Value Ref Range Status   Specimen Description URINE, RANDOM  Final   Special Requests NONE  Final   Culture   Final    NO GROWTH Performed at [facility], [address], [place], [place] [address]    Report Status 08/17/2017 FINAL  Final      Radiology Studies: US Abdomen Complete  Result Date: 08/17/2017 CLINICAL DATA:  Jaundice, pancreatitis EXAM: ABDOMEN ULTRASOUND COMPLETE COMPARISON:  CT 02/15/2015 FINDINGS: Gallbladder: Numerous small stones filling the gallbladder, the largest 9 mm. The gallbladder fundus is not well visualized due to overlying shadowing. Gallbladder wall borderline in thickness at 3 mm. Negative sonographic Murphy's. Common bile duct: Diameter: Normal caliber, 6 mm Liver: Increased echotexture compatible with fatty infiltration. No focal abnormality or biliary ductal dilatation. Portal vein is patent on color Doppler imaging with normal direction of blood flow towards the liver. IVC: No abnormality visualized. Pancreas: Pancreas not well visualized due to overlying bowel gas. Visualized portions unremarkable. No ductal dilatation. Spleen: Size and appearance  within normal limits. Right Kidney: Length: 12.3 cm. Shadowing stone in the lower pole. No hydronephrosis. Normal echotexture. Left Kidney: Length: 12.9 cm. Echogenicity within normal limits. No mass or hydronephrosis visualized. Abdominal aorta: No aneurysm visualized. Other findings: None. IMPRESSION: Cholelithiasis with numerous gallstones. No sonographic evidence of acute cholecystitis. Fatty liver. Right lower pole nephrolithiasis. Electronically Signed   By: Rolm Baptise M.D.   On: 08/17/2017 11:11      Scheduled Meds: . atorvastatin  20 mg Oral q1800  . indomethacin  100 mg Rectal Once  . multivitamin with minerals  1 tablet Oral Daily   Continuous Infusions: . sodium chloride 125 mL/hr at 08/18/17 1117  . [START ON 08/19/2017] cefTRIAXone (ROCEPHIN)  IV       LOS: 1 day    Time spent: Total of 25 minutes spent with pt, greater than 50% of which was spent in discussion of  treatment, counseling and coordination of care   Chipper Oman, MD Pager: Text Page via [email or web address]   If 7PM-7AM, please contact night-coverage [email or web address] 08/18/2017, 4:17 PM   Note - This record has been created using [employer]. Chart creation  errors have been sought, but may not always have been located. Such creation errors do not reflect on the standard of medical care.

## 2017-08-18 NOTE — Transfer of Care (Signed)
Immediate Anesthesia Transfer of Care Note  Patient: [name]  Procedure(s) Performed: ESOPHAGEAL ENDOSCOPIC ULTRASOUND (EUS) RADIAL (N/A )  Patient Location: PACU  Anesthesia Type:MAC  Level of Consciousness: sedated  Airway & Oxygen Therapy: Patient Spontanous Breathing and Patient connected to nasal cannula oxygen  Post-op Assessment: Report given to RN and Post -op Vital signs reviewed and stable  Post vital signs: Reviewed and stable  Last Vitals:  Vitals Value Taken Time  BP    Temp    Pulse 69 08/18/2017  7:46 AM  Resp 14 08/18/2017  7:46 AM  SpO2 99 % 08/18/2017  7:46 AM  Vitals shown include unvalidated device data.  Last Pain:  Vitals:   08/18/17 0658  TempSrc: Oral  PainSc: 2          Complications: No apparent anesthesia complications

## 2017-08-18 NOTE — Progress Notes (Signed)
[facility] Progress Note  Day of Surgery  Subjective: CC:  Denies abdominal pain other than "hunger pain". Denies N/V. Wife at bedside. Now agreeable to cholecystectomy.  Objective: Vital signs in last 24 hours: Temp:  [97.8 F (36.6 C)-99.8 F (37.7 C)] 97.8 F (36.6 C) (06/14 0747) Pulse Rate:  [59-72] 63 (06/14 0812) Resp:  [14-16] 16 (06/14 0812) BP: (124-167)/(53-95) 166/77 (06/14 0812) SpO2:  [94 %-99 %] 94 % (06/14 0812) Weight:  [109.8 kg (242 lb)] 109.8 kg (242 lb) (06/14 0658) Last BM Date: 08/16/17  Intake/Output from previous day: 06/13 0701 - 06/14 0700 In: 2846.7 [P.O.:480; I.V.:2366.7] Out: -  Intake/Output this shift: Total I/O In: 300 [I.V.:300] Out: 0   PE: Gen:  Alert, NAD, cooperative  Card:  Regular rate and rhythm, pedal pulses 2+ BL Pulm:  Normal effort, clear to auscultation bilaterally Abd: Soft, protuberant, +BS, non-tender Skin: warm and dry, no rashes  Psych: A&Ox3   Lab Results:  Recent Labs    08/17/17 0408 08/18/17 0402  WBC 9.9 12.7*  HGB 14.7 13.9  HCT 43.3 41.0  PLT 189 180   BMET Recent Labs    08/17/17 0408 08/18/17 0402  NA 138 135  K 4.0 4.4  CL 101 103  CO2 25 26  GLUCOSE 112* 91  BUN 15 14  CREATININE 0.79 0.97  CALCIUM 8.4* 8.1*   PT/INR No results for input(s): LABPROT, INR in the last 72 hours. CMP     Component Value Date/Time   NA 135 08/18/2017 0402   NA 141 03/13/2017 1010   K 4.4 08/18/2017 0402   CL 103 08/18/2017 0402   CO2 26 08/18/2017 0402   GLUCOSE 91 08/18/2017 0402   BUN 14 08/18/2017 0402   BUN 21 03/13/2017 1010   CREATININE 0.97 08/18/2017 0402   CALCIUM 8.1 (L) 08/18/2017 0402   PROT 6.7 08/18/2017 0402   ALBUMIN 3.1 (L) 08/18/2017 0402   AST 122 (H) 08/18/2017 0402   ALT 212 (H) 08/18/2017 0402   ALKPHOS 303 (H) 08/18/2017 0402   BILITOT 3.8 (H) 08/18/2017 0402   GFRNONAA >60 08/18/2017 0402   GFRAA >60 08/18/2017 0402   Lipase     Component Value  Date/Time   LIPASE 383 (H) 08/18/2017 0402       Studies/Results: US Abdomen Complete  Result Date: 08/17/2017 CLINICAL DATA:  Jaundice, pancreatitis EXAM: ABDOMEN ULTRASOUND COMPLETE COMPARISON:  CT 02/15/2015 FINDINGS: Gallbladder: Numerous small stones filling the gallbladder, the largest 9 mm. The gallbladder fundus is not well visualized due to overlying shadowing. Gallbladder wall borderline in thickness at 3 mm. Negative sonographic Murphy's. Common bile duct: Diameter: Normal caliber, 6 mm Liver: Increased echotexture compatible with fatty infiltration. No focal abnormality or biliary ductal dilatation. Portal vein is patent on color Doppler imaging with normal direction of blood flow towards the liver. IVC: No abnormality visualized. Pancreas: Pancreas not well visualized due to overlying bowel gas. Visualized portions unremarkable. No ductal dilatation. Spleen: Size and appearance within normal limits. Right Kidney: Length: 12.3 cm. Shadowing stone in the lower pole. No hydronephrosis. Normal echotexture. Left Kidney: Length: 12.9 cm. Echogenicity within normal limits. No mass or hydronephrosis visualized. Abdominal aorta: No aneurysm visualized. Other findings: None. IMPRESSION: Cholelithiasis with numerous gallstones. No sonographic evidence of acute cholecystitis. Fatty liver. Right lower pole nephrolithiasis. Electronically Signed   By: Rolm Baptise M.D.   On: 08/17/2017 11:11    Anti-infectives: Anti-infectives (From admission, onward)   Start  Dose/Rate Route Frequency Ordered Stop   08/18/17 0700  ampicillin-sulbactam (UNASYN) 1.5 g in sodium chloride 0.9 % 100 mL IVPB  Status:  Discontinued     1.5 g 200 mL/hr over 30 Minutes Intravenous  Once 08/17/17 2252 08/18/17 0829     Assessment/Plan H/o DVT - holding eliquis (last dose 6/12) S/p PPM 04/2017 OSA - noncompliant with CPAP HLD  Pancreatitis - suspect 2/2 choledocholithiasis  - management per primary, patient  currently on high rate IVF and clear liquids - normal EUS performed by Dr. Ardis Hughs today. No visible stones.  - lipase 383, AST 122, ALT 212, AP 303, t.bili 3.8 (from 7.7) - non-tender on abdominal exam  - plan laparoscopic cholecystectomy this admission. Repeat lipase and CMET in AM.   FEN: IVF, clear liquid diet, NPO after MN ID: none currently VTE: SCD's Foley: none     LOS: 1 day    [name] , [facility] 08/18/2017, 10:41 AM Pager: [phone] Consults: [phone] Mon-Fri 7:00 am-4:30 pm Sat-Sun 7:00 am-11:30 am

## 2017-08-18 NOTE — Interval H&P Note (Signed)
History and Physical Interval Note:  08/18/2017 7:05 AM  [name]  has presented today for surgery, with the diagnosis of Pancreatitis, suspect CBD stone  The various methods of treatment have been discussed with the patient and family. After consideration of risks, benefits and other options for treatment, the patient has consented to  Procedure(s): ENDOSCOPIC RETROGRADE CHOLANGIOPANCREATOGRAPHY (ERCP) (N/A) ESOPHAGEAL ENDOSCOPIC ULTRASOUND (EUS) RADIAL (N/A) as a surgical intervention .  The patient's history has been reviewed, patient examined, no change in status, stable for surgery.  I have reviewed the patient's chart and labs.  Questions were answered to the patient's satisfaction.     Milus Banister

## 2017-08-19 ENCOUNTER — Inpatient Hospital Stay (HOSPITAL_COMMUNITY): Payer: Medicare HMO

## 2017-08-19 ENCOUNTER — Inpatient Hospital Stay (HOSPITAL_COMMUNITY): Payer: Medicare HMO | Admitting: Anesthesiology

## 2017-08-19 ENCOUNTER — Encounter (HOSPITAL_COMMUNITY)
Admission: EM | Disposition: A | Discharge status: Home / Self Care | Payer: Self-pay | Source: Home / Self Care | Attending: Family Medicine

## 2017-08-19 ENCOUNTER — Encounter (HOSPITAL_COMMUNITY): Payer: Self-pay | Admitting: Registered Nurse

## 2017-08-19 DIAGNOSIS — K851 Biliary acute pancreatitis without necrosis or infection: Secondary | ICD-10-CM | POA: Diagnosis not present

## 2017-08-19 DIAGNOSIS — K8020 Calculus of gallbladder without cholecystitis without obstruction: Secondary | ICD-10-CM | POA: Diagnosis not present

## 2017-08-19 HISTORY — PX: CHOLECYSTECTOMY: SHX55

## 2017-08-19 LAB — LIPASE, BLOOD: Lipase: 116 U/L — ABNORMAL HIGH (ref 11–51)

## 2017-08-19 LAB — COMPREHENSIVE METABOLIC PANEL
ALBUMIN: 2.9 g/dL — AB (ref 3.5–5.0)
ALK PHOS: 259 U/L — AB (ref 38–126)
ALT: 143 U/L — AB (ref 17–63)
AST: 54 U/L — ABNORMAL HIGH (ref 15–41)
Anion gap: 7 (ref 5–15)
BUN: 12 mg/dL (ref 6–20)
CALCIUM: 8.2 mg/dL — AB (ref 8.9–10.3)
CHLORIDE: 106 mmol/L (ref 101–111)
CO2: 23 mmol/L (ref 22–32)
CREATININE: 0.84 mg/dL (ref 0.61–1.24)
GFR calc Af Amer: >60 mL/min (ref >60)
GFR calc non Af Amer: >60 mL/min (ref >60)
Glucose, Bld: 112 mg/dL — ABNORMAL HIGH (ref 65–99)
Potassium: 4.2 mmol/L (ref 3.5–5.1)
SODIUM: 136 mmol/L (ref 135–145)
Total Bilirubin: 2.1 mg/dL — ABNORMAL HIGH (ref 0.3–1.2)
Total Protein: 6.6 g/dL (ref 6.5–8.1)

## 2017-08-19 LAB — SURGICAL PCR SCREEN
MRSA, PCR: NEGATIVE
Staphylococcus aureus: NEGATIVE

## 2017-08-19 SURGERY — LAPAROSCOPIC CHOLECYSTECTOMY WITH INTRAOPERATIVE CHOLANGIOGRAM
Anesthesia: General | Site: Abdomen

## 2017-08-19 MED ORDER — ENOXAPARIN SODIUM 40 MG/0.4ML ~~LOC~~ SOLN
40.0000 mg | SUBCUTANEOUS | Status: DC
  Administered 2017-08-20 – 2017-08-21 (×2): 40 mg via SUBCUTANEOUS
  Filled 2017-08-19: qty 0.4

## 2017-08-19 MED ORDER — ROCURONIUM BROMIDE 10 MG/ML (PF) SYRINGE
PREFILLED_SYRINGE | INTRAVENOUS | Status: DC | PRN
  Administered 2017-08-19: 10 mg via INTRAVENOUS
  Administered 2017-08-19: 35 mg via INTRAVENOUS
  Administered 2017-08-19: 20 mg via INTRAVENOUS
  Administered 2017-08-19: 5 mg via INTRAVENOUS

## 2017-08-19 MED ORDER — HYDROMORPHONE HCL 1 MG/ML IJ SOLN: 1.0000 mg | INTRAMUSCULAR | Status: DC | PRN

## 2017-08-19 MED ORDER — FENTANYL CITRATE (PF) 100 MCG/2ML IJ SOLN
INTRAMUSCULAR | Status: AC
  Filled 2017-08-19: qty 4

## 2017-08-19 MED ORDER — ACETAMINOPHEN 325 MG PO TABS: 650.0000 mg | ORAL_TABLET | Freq: Four times a day (QID) | ORAL | Status: DC | PRN

## 2017-08-19 MED ORDER — LACTATED RINGERS IR SOLN
Status: DC | PRN
  Administered 2017-08-19: 1000 mL

## 2017-08-19 MED ORDER — FENTANYL CITRATE (PF) 250 MCG/5ML IJ SOLN
INTRAMUSCULAR | Status: AC
  Filled 2017-08-19: qty 5

## 2017-08-19 MED ORDER — IOPAMIDOL (ISOVUE-300) INJECTION 61%
INTRAVENOUS | Status: DC | PRN
  Administered 2017-08-19: 8 mL

## 2017-08-19 MED ORDER — HYDROCODONE-ACETAMINOPHEN 5-325 MG PO TABS: 1.0000 | ORAL_TABLET | ORAL | Status: DC | PRN

## 2017-08-19 MED ORDER — ALBUMIN HUMAN 5 % IV SOLN
INTRAVENOUS | Status: AC
  Filled 2017-08-19: qty 250

## 2017-08-19 MED ORDER — FENTANYL CITRATE (PF) 100 MCG/2ML IJ SOLN
25.0000 ug | INTRAMUSCULAR | Status: DC | PRN
  Administered 2017-08-19: 50 ug via INTRAVENOUS

## 2017-08-19 MED ORDER — KCL IN DEXTROSE-NACL 20-5-0.45 MEQ/L-%-% IV SOLN
INTRAVENOUS | Status: DC
  Administered 2017-08-19 – 2017-08-20 (×2): via INTRAVENOUS
  Filled 2017-08-19 (×2): qty 1000

## 2017-08-19 MED ORDER — LABETALOL HCL 5 MG/ML IV SOLN
INTRAVENOUS | Status: AC
  Filled 2017-08-19: qty 4

## 2017-08-19 MED ORDER — IOPAMIDOL (ISOVUE-300) INJECTION 61%
INTRAVENOUS | Status: AC
  Filled 2017-08-19: qty 50

## 2017-08-19 MED ORDER — ONDANSETRON 4 MG PO TBDP: 4.0000 mg | ORAL_TABLET | Freq: Four times a day (QID) | ORAL | Status: DC | PRN

## 2017-08-19 MED ORDER — LABETALOL HCL 5 MG/ML IV SOLN
INTRAVENOUS | Status: DC | PRN
  Administered 2017-08-19 (×3): 5 mg via INTRAVENOUS

## 2017-08-19 MED ORDER — LACTATED RINGERS IV SOLN: INTRAVENOUS | Status: DC | PRN

## 2017-08-19 MED ORDER — SUGAMMADEX SODIUM 200 MG/2ML IV SOLN
INTRAVENOUS | Status: DC | PRN
  Administered 2017-08-19: 200 mg via INTRAVENOUS

## 2017-08-19 MED ORDER — MIDAZOLAM HCL 2 MG/2ML IJ SOLN
INTRAMUSCULAR | Status: AC
  Filled 2017-08-19: qty 2

## 2017-08-19 MED ORDER — PROPOFOL 10 MG/ML IV BOLUS
INTRAVENOUS | Status: AC
  Filled 2017-08-19: qty 20

## 2017-08-19 MED ORDER — ACETAMINOPHEN 650 MG RE SUPP: 650.0000 mg | Freq: Four times a day (QID) | RECTAL | Status: DC | PRN

## 2017-08-19 MED ORDER — LIDOCAINE 2% (20 MG/ML) 5 ML SYRINGE
INTRAMUSCULAR | Status: DC | PRN
  Administered 2017-08-19: 75 mg via INTRAVENOUS

## 2017-08-19 MED ORDER — TRAMADOL HCL 50 MG PO TABS: 50.0000 mg | ORAL_TABLET | Freq: Four times a day (QID) | ORAL | Status: DC | PRN

## 2017-08-19 MED ORDER — HYDRALAZINE HCL 20 MG/ML IJ SOLN
INTRAMUSCULAR | Status: AC
  Filled 2017-08-19: qty 1

## 2017-08-19 MED ORDER — LACTATED RINGERS IV SOLN
INTRAVENOUS | Status: DC | PRN
  Administered 2017-08-19: 14:00:00 via INTRAVENOUS

## 2017-08-19 MED ORDER — DEXAMETHASONE SODIUM PHOSPHATE 10 MG/ML IJ SOLN
INTRAMUSCULAR | Status: DC | PRN
  Administered 2017-08-19: 10 mg via INTRAVENOUS

## 2017-08-19 MED ORDER — BUPIVACAINE-EPINEPHRINE (PF) 0.25% -1:200000 IJ SOLN
INTRAMUSCULAR | Status: AC
  Filled 2017-08-19: qty 30

## 2017-08-19 MED ORDER — ROCURONIUM BROMIDE 100 MG/10ML IV SOLN
INTRAVENOUS | Status: AC
  Filled 2017-08-19: qty 1

## 2017-08-19 MED ORDER — FENTANYL CITRATE (PF) 100 MCG/2ML IJ SOLN
INTRAMUSCULAR | Status: DC | PRN
  Administered 2017-08-19: 50 ug via INTRAVENOUS
  Administered 2017-08-19: 100 ug via INTRAVENOUS
  Administered 2017-08-19: 50 ug via INTRAVENOUS
  Administered 2017-08-19: 100 ug via INTRAVENOUS
  Administered 2017-08-19: 50 ug via INTRAVENOUS
  Administered 2017-08-19: 100 ug via INTRAVENOUS
  Administered 2017-08-19: 50 ug via INTRAVENOUS

## 2017-08-19 MED ORDER — ONDANSETRON HCL 4 MG/2ML IJ SOLN: 4.0000 mg | Freq: Four times a day (QID) | INTRAMUSCULAR | Status: DC | PRN

## 2017-08-19 MED ORDER — PROMETHAZINE HCL 25 MG/ML IJ SOLN: 6.2500 mg | INTRAMUSCULAR | Status: DC | PRN

## 2017-08-19 MED ORDER — LOSARTAN POTASSIUM 50 MG PO TABS
100.0000 mg | ORAL_TABLET | Freq: Every day | ORAL | Status: DC
  Administered 2017-08-19 – 2017-08-22 (×4): 100 mg via ORAL
  Filled 2017-08-19 (×4): qty 2

## 2017-08-19 MED ORDER — LIDOCAINE 2% (20 MG/ML) 5 ML SYRINGE
INTRAMUSCULAR | Status: AC
  Filled 2017-08-19: qty 10

## 2017-08-19 MED ORDER — 0.9 % SODIUM CHLORIDE (POUR BTL) OPTIME
TOPICAL | Status: DC | PRN
  Administered 2017-08-19: 1000 mL

## 2017-08-19 MED ORDER — SUCCINYLCHOLINE CHLORIDE 200 MG/10ML IV SOSY
PREFILLED_SYRINGE | INTRAVENOUS | Status: DC | PRN
  Administered 2017-08-19: 140 mg via INTRAVENOUS

## 2017-08-19 MED ORDER — SODIUM CHLORIDE 0.9 % IV SOLN
INTRAVENOUS | Status: AC
  Filled 2017-08-19: qty 20

## 2017-08-19 MED ORDER — SUCCINYLCHOLINE CHLORIDE 200 MG/10ML IV SOSY
PREFILLED_SYRINGE | INTRAVENOUS | Status: AC
  Filled 2017-08-19: qty 10

## 2017-08-19 MED ORDER — PROPOFOL 10 MG/ML IV BOLUS
INTRAVENOUS | Status: DC | PRN
  Administered 2017-08-19: 130 mg via INTRAVENOUS
  Administered 2017-08-19: 50 mg via INTRAVENOUS

## 2017-08-19 SURGICAL SUPPLY — 44 items
APPLIER CLIP ROT 10 11.4 M/L (STAPLE) ×2
APR CLP MED LRG 11.4X10 (STAPLE) ×1
BAG SPEC RTRVL LRG 6X4 10 (ENDOMECHANICALS) ×1
CABLE HIGH FREQUENCY MONO STRZ (ELECTRODE) ×2 IMPLANT
CATH REDDICK CHOLANGI 4FR 50CM (CATHETERS) ×1 IMPLANT
CHLORAPREP W/TINT 26ML (MISCELLANEOUS) ×3 IMPLANT
CLIP APPLIE ROT 10 11.4 M/L (STAPLE) ×1 IMPLANT
COVER MAYO STAND STRL (DRAPES) ×2 IMPLANT
COVER SURGICAL LIGHT HANDLE (MISCELLANEOUS) ×2 IMPLANT
DECANTER SPIKE VIAL GLASS SM (MISCELLANEOUS) ×2 IMPLANT
DRAIN CHANNEL 19F RND (DRAIN) ×1 IMPLANT
DRAPE C-ARM 42X120 X-RAY (DRAPES) ×2 IMPLANT
DRSG OPSITE POSTOP 4X8 (GAUZE/BANDAGES/DRESSINGS) ×1 IMPLANT
ELECT PENCIL ROCKER SW 15FT (MISCELLANEOUS) ×1 IMPLANT
ELECT REM PT RETURN 15FT ADLT (MISCELLANEOUS) ×2 IMPLANT
EVACUATOR SILICONE 100CC (DRAIN) ×1 IMPLANT
GAUZE SPONGE 2X2 8PLY STRL LF (GAUZE/BANDAGES/DRESSINGS) ×1 IMPLANT
GLOVE SURG ORTHO 8.0 STRL STRW (GLOVE) ×2 IMPLANT
GOWN STRL REUS W/TWL XL LVL3 (GOWN DISPOSABLE) ×5 IMPLANT
HEMOSTAT SNOW SURGICEL 2X4 (HEMOSTASIS) ×1 IMPLANT
HEMOSTAT SURGICEL 4X8 (HEMOSTASIS) IMPLANT
KIT BASIN OR (CUSTOM PROCEDURE TRAY) ×2 IMPLANT
POUCH SPECIMEN RETRIEVAL 10MM (ENDOMECHANICALS) ×2 IMPLANT
SCISSORS LAP 5X35 DISP (ENDOMECHANICALS) ×2 IMPLANT
SET CHOLANGIOGRAPH MIX (MISCELLANEOUS) ×2 IMPLANT
SET IRRIG TUBING LAPAROSCOPIC (IRRIGATION / IRRIGATOR) ×2 IMPLANT
SLEEVE XCEL OPT CAN 5 100 (ENDOMECHANICALS) ×2 IMPLANT
SPONGE DRAIN TRACH 4X4 STRL 2S (GAUZE/BANDAGES/DRESSINGS) ×1 IMPLANT
SPONGE GAUZE 2X2 STER 10/PKG (GAUZE/BANDAGES/DRESSINGS) ×1
STAPLER VISISTAT 35W (STAPLE) ×1 IMPLANT
STRIP CLOSURE SKIN 1/2X4 (GAUZE/BANDAGES/DRESSINGS) ×2 IMPLANT
SUCTION YANKAUER HANDLE (MISCELLANEOUS) ×1 IMPLANT
SUT ETHILON 2 0 PS N (SUTURE) ×1 IMPLANT
SUT MNCRL AB 4-0 PS2 18 (SUTURE) ×2 IMPLANT
SUT NOVA NAB GS-21 1 T12 (SUTURE) ×1 IMPLANT
SUT SILK 3 0 SH CR/8 (SUTURE) ×1 IMPLANT
TAPE CLOTH SURG 4X10 WHT LF (GAUZE/BANDAGES/DRESSINGS) ×1 IMPLANT
TOWEL OR 17X26 10 PK STRL BLUE (TOWEL DISPOSABLE) ×2 IMPLANT
TOWEL OR NON WOVEN STRL DISP B (DISPOSABLE) ×2 IMPLANT
TRAY LAPAROSCOPIC (CUSTOM PROCEDURE TRAY) ×2 IMPLANT
TROCAR BLADELESS OPT 5 100 (ENDOMECHANICALS) ×2 IMPLANT
TROCAR XCEL BLUNT TIP 100MML (ENDOMECHANICALS) ×2 IMPLANT
TROCAR XCEL NON-BLD 11X100MML (ENDOMECHANICALS) ×2 IMPLANT
TUBING INSUF HEATED (TUBING) IMPLANT

## 2017-08-19 NOTE — Anesthesia Procedure Notes (Signed)
Procedure Name: Intubation Date/Time: 08/19/2017 1:11 PM Performed by: Lissa Morales, CRNA Pre-anesthesia Checklist: Patient identified, Emergency Drugs available, Suction available and Patient being monitored Patient Re-evaluated:Patient Re-evaluated prior to induction Oxygen Delivery Method: Circle system utilized Preoxygenation: Pre-oxygenation with 100% oxygen Induction Type: IV induction Ventilation: Mask ventilation without difficulty Laryngoscope Size: Mac and 4 Grade View: Grade I Tube type: Oral Tube size: 8.0 mm Number of attempts: 1 Airway Equipment and Method: Stylet and Oral airway Placement Confirmation: ETT inserted through vocal cords under direct vision,  positive ETCO2 and breath sounds checked- equal and bilateral Secured at: 23 cm Tube secured with: Tape Dental Injury: Teeth and Oropharynx as per pre-operative assessment

## 2017-08-19 NOTE — Anesthesia Postprocedure Evaluation (Signed)
Anesthesia Post Note  Patient: [name]  Procedure(s) Performed: LAPAROSCOPIC ASSISTED OPEN CHOLECYSTECTOMY WITH INTRAOPERATIVE CHOLANGIOGRAM (N/A Abdomen)     Patient location during evaluation: PACU Anesthesia Type: General Level of consciousness: sedated Pain management: pain level controlled Vital Signs Assessment: post-procedure vital signs reviewed and stable Respiratory status: spontaneous breathing and respiratory function stable Cardiovascular status: stable Postop Assessment: no apparent nausea or vomiting Anesthetic complications: no    Last Vitals:  Vitals:   08/19/17 1630 08/19/17 1835  BP: (!) 177/80 (!) 159/86  Pulse: 66 72  Resp: 14 16  Temp: 36.7 C 36.8 C  SpO2: 98% 99%                 [name] DANIEL

## 2017-08-19 NOTE — Transfer of Care (Signed)
Immediate Anesthesia Transfer of Care Note  Patient: [name]  Procedure(s) Performed: LAPAROSCOPIC ASSISTED OPEN CHOLECYSTECTOMY WITH INTRAOPERATIVE CHOLANGIOGRAM (N/A Abdomen)  Patient Location: PACU  Anesthesia Type:General  Level of Consciousness: awake, alert , oriented and patient cooperative  Airway & Oxygen Therapy: Patient Spontanous Breathing and Patient connected to face mask oxygen  Post-op Assessment: Report given to RN, Post -op Vital signs reviewed and stable and Patient moving all extremities X 4  Post vital signs: stable  Last Vitals:  Vitals Value Taken Time  BP 172/88 08/19/2017  3:30 PM  Temp    Pulse 65 08/19/2017  3:32 PM  Resp 15 08/19/2017  3:32 PM  SpO2 97 % 08/19/2017  3:32 PM  Vitals shown include unvalidated device data.  Last Pain:  Vitals:   08/19/17 1527  TempSrc:   PainSc: (P) 8          Complications: No apparent anesthesia complications

## 2017-08-19 NOTE — Progress Notes (Signed)
Pt. arrived to floor via stretcher from PACU. Alert and oriented x 4. No respiratory distress noted. Family at bedside.

## 2017-08-19 NOTE — Progress Notes (Signed)
Per Reine Just from pharmacy, patient's rocephin iv antibiotic is on call to the OR.

## 2017-08-19 NOTE — Anesthesia Preprocedure Evaluation (Signed)
Anesthesia Evaluation  Patient identified by MRN, date of birth, ID band Patient awake    Reviewed: Allergy & Precautions, NPO status , Patient's Chart, lab work & pertinent test results  History of Anesthesia Complications Negative for: history of anesthetic complications  Airway Mallampati: I  TM Distance: >3 FB Neck ROM: Full    Dental   Pulmonary sleep apnea ,    Pulmonary exam normal        Cardiovascular hypertension, Pt. on medications Normal cardiovascular exam+ dysrhythmias + pacemaker      Neuro/Psych negative neurological ROS     GI/Hepatic Neg liver ROS,   Endo/Other    Renal/GU Renal InsufficiencyRenal disease     Musculoskeletal   Abdominal   Peds  Hematology   Anesthesia Other Findings   Reproductive/Obstetrics                             Anesthesia Physical  Anesthesia Plan  ASA: III  Anesthesia Plan: General   Post-op Pain Management:    Induction: Intravenous  PONV Risk Score and Plan: 3 and Ondansetron, Treatment may vary due to age or medical condition, Dexamethasone and Diphenhydramine  Airway Management Planned: Oral ETT  Additional Equipment:   Intra-op Plan:   Post-operative Plan: Extubation in OR  Informed Consent: I have reviewed the patients History and Physical, chart, labs and discussed the procedure including the risks, benefits and alternatives for the proposed anesthesia with the patient or authorized representative who has indicated his/her understanding and acceptance.     Plan Discussed with: CRNA and Surgeon  Anesthesia Plan Comments:         Anesthesia Quick Evaluation

## 2017-08-19 NOTE — Progress Notes (Signed)
General Surgery [facility]  Assessment & Plan: Biliary pancreatitis, cholelithiasis  LFT's and lipase near normal this AM  Patient feels well, denies abdominal pain  Results of EGD/EUS reviewed and on chart - no sign of choledocholithiasis  Plan to proceed with lap chole with IOC / possible open today  The risks and benefits of the procedure have been discussed at length with the patient.  The patient understands the proposed procedure, potential alternative treatments, and the course of recovery to be expected.  All of the patient's questions have been answered at this time.  The patient wishes to proceed with surgery.        Earnstine Regal, MD, [facility]       Office: [phone]   Chief Complaint: Biliary pancreatitis, abdominal pain  Subjective: Patient in bed, on phone.  Comfortable.  No complaints.  NPO this AM.  Wishes to proceed with surgery today.  Objective: Vital signs in last 24 hours: Temp:  [98 F (36.7 C)-100.8 F (38.2 C)] 98 F (36.7 C) (06/15 0439) Pulse Rate:  [63-90] 90 (06/15 0439) Resp:  [10-18] 10 (06/15 0439) BP: (146-179)/(71-89) 146/89 (06/15 0439) SpO2:  [99 %-100 %] 100 % (06/15 0439) Last BM Date: 08/16/17  Intake/Output from previous day: 06/14 0701 - 06/15 0700 In: 770 [P.O.:470; I.V.:300] Out: 0  Intake/Output this shift: No intake/output data recorded.  Physical Exam: HEENT - sclerae clear, mucous membranes moist Neck - soft Chest - clear bilaterally Cor - RRR Abdomen - soft without distension; non-tender; no mass Ext - no edema, non-tender Neuro - alert & oriented, no focal deficits  Lab Results:  Recent Labs    08/17/17 0408 08/18/17 0402  WBC 9.9 12.7*  HGB 14.7 13.9  HCT 43.3 41.0  PLT 189 180   BMET Recent Labs    08/18/17 0402 08/19/17 0446  NA 135 136  K 4.4 4.2  CL 103 106  CO2 26 23  GLUCOSE 91 112*  BUN 14 12  CREATININE 0.97 0.84  CALCIUM 8.1*  8.2*   PT/INR No results for input(s): LABPROT, INR in the last 72 hours. Comprehensive Metabolic Panel:    Component Value Date/Time   NA 136 08/19/2017 0446   NA 135 08/18/2017 0402   NA 141 03/13/2017 1010   K 4.2 08/19/2017 0446   K 4.4 08/18/2017 0402   CL 106 08/19/2017 0446   CL 103 08/18/2017 0402   CO2 23 08/19/2017 0446   CO2 26 08/18/2017 0402   BUN 12 08/19/2017 0446   BUN 14 08/18/2017 0402   BUN 21 03/13/2017 1010   CREATININE 0.84 08/19/2017 0446   CREATININE 0.97 08/18/2017 0402   GLUCOSE 112 (H) 08/19/2017 0446   GLUCOSE 91 08/18/2017 0402   CALCIUM 8.2 (L) 08/19/2017 0446   CALCIUM 8.1 (L) 08/18/2017 0402   AST 54 (H) 08/19/2017 0446   AST 122 (H) 08/18/2017 0402   ALT 143 (H) 08/19/2017 0446   ALT 212 (H) 08/18/2017 0402   ALKPHOS 259 (H) 08/19/2017 0446   ALKPHOS 303 (H) 08/18/2017 0402   BILITOT 2.1 (H) 08/19/2017 0446   BILITOT 3.8 (H) 08/18/2017 0402   PROT 6.6 08/19/2017 0446   PROT 6.7 08/18/2017 0402   ALBUMIN 2.9 (L) 08/19/2017 0446   ALBUMIN 3.1 (L) 08/18/2017 0402    Studies/Results: US Abdomen Complete  Result Date: 08/17/2017 CLINICAL DATA:  Jaundice, pancreatitis EXAM: ABDOMEN ULTRASOUND COMPLETE  COMPARISON:  CT 02/15/2015 FINDINGS: Gallbladder: Numerous small stones filling the gallbladder, the largest 9 mm. The gallbladder fundus is not well visualized due to overlying shadowing. Gallbladder wall borderline in thickness at 3 mm. Negative sonographic Murphy's. Common bile duct: Diameter: Normal caliber, 6 mm Liver: Increased echotexture compatible with fatty infiltration. No focal abnormality or biliary ductal dilatation. Portal vein is patent on color Doppler imaging with normal direction of blood flow towards the liver. IVC: No abnormality visualized. Pancreas: Pancreas not well visualized due to overlying bowel gas. Visualized portions unremarkable. No ductal dilatation. Spleen: Size and appearance within normal limits. Right Kidney:  Length: 12.3 cm. Shadowing stone in the lower pole. No hydronephrosis. Normal echotexture. Left Kidney: Length: 12.9 cm. Echogenicity within normal limits. No mass or hydronephrosis visualized. Abdominal aorta: No aneurysm visualized. Other findings: None. IMPRESSION: Cholelithiasis with numerous gallstones. No sonographic evidence of acute cholecystitis. Fatty liver. Right lower pole nephrolithiasis. Electronically Signed   By: Rolm Baptise M.D.   On: 08/17/2017 11:11      Enio Hornback M 08/19/2017  Patient ID: [name], male   DOB: [birth year], 77 y.o.   MRN: [id number]

## 2017-08-19 NOTE — Progress Notes (Signed)
PROGRESS NOTE [facility]   [name]   TIW:[id number] DOB: 1940-09-08  DOA: 08/17/2017 PCP: Merrilee Seashore, MD   Brief Narrative:  [name] is a 77 year old male with medical history significant for hypertension, PPM and DVT on Eliquis.  Patient presented to the emergency department complaining of abdominal pain.  Upon ED evaluation LFTs were elevated along with T bili 7.7, lipase >3500.  Patient was admitted with working diagnosis of acute pancreatitis.  Ultrasound of the abdomen reviewed cholelithiasis with numerous of gallstone, no evidence of acute cholecystitis.  Right lower pole nephrolithiasis.  GI was consulted and patient underwent upper EUS.  General surgery consult  Subjective: Patient seen and examined, continues to do well, denies abdominal pain.  Wants to proceed with surgery.  No acute events overnight.  Assessment & Plan: Acute biliary pancreatitis due to cholelithiasis  Ultrasound with multiple gallstones, no stone on CBD. GI and general surgery recommendations appreciated. Clinically improving, likely passed a stone, LFTs and lipase trending down Pending lap chole today, hopefully can be seen in a.m. if no complication  Hypertension BP slightly elevated, likely related to IV fluid and not getting losartan. Continue losartan Continue to monitor  History of DVT diagnosed March 2019 Previously on Eliquis, holding in view of procedure No need for heparin drip at this time. Will defer to surgery on when to resume Eliquis  PPM due to symptomatic second-degree AV block Heart rate pacing well. Stable  DVT prophylaxis: SCDs Code Status: Full code Family Communication: None at bedside Disposition Plan: Home when surgically cleared   Consultants:   GI  General surgery  Procedures:   EUS 6/14  Antimicrobials:  None   Objective: Vitals:   08/18/17 0812 08/18/17 1530 08/18/17 2031 08/19/17 0439  BP: (!) 166/77 (!) 179/89 (!)  151/71 (!) 146/89  Pulse: 63 68 63 90  Resp: 16 18 11 10   Temp:  98.1 F (36.7 C) (!) 100.8 F (38.2 C) 98 F (36.7 C)  TempSrc:  Oral Oral Oral  SpO2: 94% 100% 99% 100%  Weight:      Height:        Intake/Output Summary (Last 24 hours) at 08/19/2017 1032 Last data filed at 08/18/2017 1947 Gross per 24 hour  Intake 470 ml  Output -  Net 470 ml   Filed Weights   08/17/17 0420 08/17/17 1401 08/18/17 0658  Weight: 109.8 kg (242 lb) 109.8 kg (242 lb) 109.8 kg (242 lb)    Examination:  General: Pt is alert, awake, not in acute distress Cardiovascular: RRR, S1/S2 +, no rubs, no gallops Respiratory: CTA bilaterally, no wheezing, no rhonchi Abdominal: Soft, NT, ND, bowel sounds + Extremities: no edema, no cyanosis   Data Reviewed: I have personally reviewed following labs and imaging studies  CBC: Recent Labs  Lab 08/17/17 0408 08/18/17 0402  WBC 9.9 12.7*  NEUTROABS 7.7  --   HGB 14.7 13.9  HCT 43.3 41.0  MCV 90.8 90.3  PLT 189 250   Basic Metabolic Panel: Recent Labs  Lab 08/17/17 0408 08/18/17 0402 08/19/17 0446  NA 138 135 136  K 4.0 4.4 4.2  CL 101 103 106  CO2 25 26 23   GLUCOSE 112* 91 112*  BUN 15 14 12   CREATININE 0.79 0.97 0.84  CALCIUM 8.4* 8.1* 8.2*   GFR: Estimated Creatinine Clearance: 94.3 mL/min (by C-G formula based on SCr of 0.84 mg/dL). Liver Function Tests: Recent Labs  Lab 08/17/17 0408 08/18/17 0402 08/19/17 0446  AST  177* 122* 54*  ALT 279* 212* 143*  ALKPHOS 284* 303* 259*  BILITOT 7.7* 3.8* 2.1*  PROT 6.9 6.7 6.6  ALBUMIN 3.5 3.1* 2.9*   Recent Labs  Lab 08/17/17 0408 08/18/17 0402 08/19/17 0446  LIPASE 3,586* 383* 116*   No results for input(s): AMMONIA in the last 168 hours. Coagulation Profile: No results for input(s): INR, PROTIME in the last 168 hours. Cardiac Enzymes: No results for input(s): CKTOTAL, CKMB, CKMBINDEX, TROPONINI in the last 168 hours. BNP (last 3 results) No results for input(s): PROBNP in  the last 8760 hours. HbA1C: No results for input(s): HGBA1C in the last 72 hours. CBG: No results for input(s): GLUCAP in the last 168 hours. Lipid Profile: No results for input(s): CHOL, HDL, LDLCALC, TRIG, CHOLHDL, LDLDIRECT in the last 72 hours. Thyroid Function Tests: No results for input(s): TSH, T4TOTAL, FREET4, T3FREE, THYROIDAB in the last 72 hours. Anemia Panel: No results for input(s): VITAMINB12, FOLATE, FERRITIN, TIBC, IRON, RETICCTPCT in the last 72 hours. Sepsis Labs: No results for input(s): PROCALCITON, LATICACIDVEN in the last 168 hours.  Recent Results (from the past 240 hour(s))  Urine culture     Status: None   Collection Time: 08/15/17  8:44 PM  Result Value Ref Range Status   Specimen Description URINE, RANDOM  Final   Special Requests NONE  Final   Culture   Final    NO GROWTH Performed at [facility], [address] [place], [place] [address]    Report Status 08/17/2017 FINAL  Final      Radiology Studies: US Abdomen Complete  Result Date: 08/17/2017 CLINICAL DATA:  Jaundice, pancreatitis EXAM: ABDOMEN ULTRASOUND COMPLETE COMPARISON:  CT 02/15/2015 FINDINGS: Gallbladder: Numerous small stones filling the gallbladder, the largest 9 mm. The gallbladder fundus is not well visualized due to overlying shadowing. Gallbladder wall borderline in thickness at 3 mm. Negative sonographic Murphy's. Common bile duct: Diameter: Normal caliber, 6 mm Liver: Increased echotexture compatible with fatty infiltration. No focal abnormality or biliary ductal dilatation. Portal vein is patent on color Doppler imaging with normal direction of blood flow towards the liver. IVC: No abnormality visualized. Pancreas: Pancreas not well visualized due to overlying bowel gas. Visualized portions unremarkable. No ductal dilatation. Spleen: Size and appearance within normal limits. Right Kidney: Length: 12.3 cm. Shadowing stone in the lower pole. No hydronephrosis. Normal echotexture.  Left Kidney: Length: 12.9 cm. Echogenicity within normal limits. No mass or hydronephrosis visualized. Abdominal aorta: No aneurysm visualized. Other findings: None. IMPRESSION: Cholelithiasis with numerous gallstones. No sonographic evidence of acute cholecystitis. Fatty liver. Right lower pole nephrolithiasis. Electronically Signed   By: Rolm Baptise M.D.   On: 08/17/2017 11:11      Scheduled Meds: . atorvastatin  20 mg Oral q1800  . indomethacin  100 mg Rectal Once  . losartan  100 mg Oral Daily  . multivitamin with minerals  1 tablet Oral Daily   Continuous Infusions: . sodium chloride 75 mL/hr at 08/19/17 0550  . cefTRIAXone (ROCEPHIN)  IV       LOS: 2 days    Time spent: Total of 25 minutes spent with pt, greater than 50% of which was spent in discussion of  treatment, counseling and coordination of care   Chipper Oman, MD Pager: Text Page via [email or web address]   If 7PM-7AM, please contact night-coverage [email or web address] 08/19/2017, 10:32 AM   Note - This record has been created using [employer]. Chart creation errors have been sought, but may not always  have been located. Such creation errors do not reflect on the standard of medical care.

## 2017-08-19 NOTE — Op Note (Signed)
NAME: [name], STRAUGHTER MEDICAL RECORD QQ:[id number] ACCOUNT [id number] DATE OF BIRTH:August 28, 1940 FACILITY: [facility] LOCATION: [facility] PHYSICIAN:Emiliya Chretien Leeanne Mannan, MD  OPERATIVE REPORT  DATE OF PROCEDURE:  08/19/2017  PREOPERATIVE DIAGNOSES:  Chronic cholecystitis, cholelithiasis, rule out choledocholithiasis.  POSTOPERATIVE DIAGNOSES:  Chronic cholecystitis, cholelithiasis, rule out choledocholithiasis.  PROCEDURES: 1.  Attempted laparoscopic cholecystectomy. 2.  Open cholecystectomy with intraoperative cholangiography. 3.  Placement of right upper quadrant drain.  SURGEON:  Armandina Gemma, M.D.  ANESTHESIA:  General per Dr. Tedra Senegal.  ESTIMATED BLOOD LOSS:  100 mL.  PREPARATION:  ChloraPrep.  COMPLICATIONS:  None.  INDICATIONS:  The patient is a 77 year old male admitted on the medical service with chronic cholecystitis, cholelithiasis, and biliary pancreatitis.  After resolution of pancreatitis, the patient underwent an endoscopic ultrasound examination, which did  not see any evidence of choledocholithiasis.  Liver function test and pancreatic enzymes improved.  The patient was prepared and brought to the operating room for cholecystectomy.  DESCRIPTION OF PROCEDURE  The procedure was done in OR #1 at the [facility].  The patient was brought to the operating room and placed in a supine position on the operating room table.  Following administration of general anesthesia, the patient was positioned and then prepped and draped in the usual aseptic fashion.  After  ascertaining that an adequate level of anesthesia had been achieved, an infraumbilical incision was made with a #15 blade.  Dissection was carried through subcutaneous tissues.  Fascia was incised in the midline and the peritoneal cavity was entered  cautiously.  A 0 Vicryl pursestring suture was placed in the fascia.  A Hasson cannula was introduced under direct vision and secured with the pursestring  suture.  Abdomen was insufflated with carbon dioxide.  Laparoscope was introduced and the abdomen  explored.  There are omental adhesions to the undersurface of the liver in the right upper quadrant.  Operative ports were placed along the right costal margin in the midline, midclavicular line, and anterior axillary line.  With gentle dissection, the  omentum was mobilized off the undersurface of the liver.  Liver was lifted and the electrocautery was used to free the omentum from the liver edge.  Gallbladder is identified.  It is small and contracted and extremely thick walled.  There are chronic  inflammatory changes present.  With some difficulty, the omentum was mobilized off the entire undersurface of the gallbladder, exposing the gallbladder and the undersurface of the liver.  Fundus of the gallbladder was grasped and retracted cephalad.   With gentle dissection, the gallbladder was completely dissected out.  However, it is not clear, due to chronic and acute inflammation and edema, where the gallbladder ends and where the hepatic hilar structures begin.  Decision was then made to convert  to open surgery to prevent injury to the hilar structures.  Therefore, laparoscopic ports were all removed.  Pneumoperitoneum is released.  Instruments were reset.  Using a #10 blade, a right subcostal incision was made connecting the 2 previous port sites in the midline and anterior axillary line.  Dissection was carried through subcutaneous tissues down to the fascia.  Fascia was incised with the electrocautery.   The right rectus muscle was divided with the electrocautery.  The posterior sheath was incised and the peritoneal cavity was entered cautiously.  A Bookwalter retractor was placed for exposure.  The gallbladder is extremely firm, thick contracted, and  densely adherent to the undersurface of the liver.  Beginning at the fundus of the gallbladder,  the gallbladder was mobilized off the surface of the  liver.  Dissection was carried down the posterior wall of the gallbladder.  Cystic artery was identified  and secured with a Ligaclip.  Dissection became increasingly difficult as we approached the hepatic hilum.  Again, it was difficult to discern the structures due to the inflammatory changes and fibrosis.  Decision was made to incise the gallbladder.   This was performed showing a very small lumen at what appeared to be the neck of the gallbladder.  A Reddick cholangiography catheter was inserted and a clip was placed for marking purposes at the edge of the incision into the gallbladder.   Cholangiography was performed.  There was free flow of contrast in the common bile duct distally into the duodenum.  There was reflux of contrast into the pancreatic duct.  There was reflux of contrast into the common hepatic duct and into the right and  left hepatic ductal systems.  There appears to be a replaced right hepatic duct.  There appears to be a few centimeters remaining of the cystic duct.  There does not appear to be any residual gallbladder.  The clip was retrieved and the Reddick catheter  was removed.  Retractor was replaced.  Gallbladder was amputated and submitted to pathology.  The cystic duct at the level of the insertion of the cholangiogram catheter was then closed with interrupted 3-0 silk sutures.  Hemostasis was obtained in the  portal hilum with interrupted 3-0 silk sutures.  Abdomen was irrigated and evacuated showing good hemostasis.  A 19 [place] Blake drain was brought in through the lateral port site and placed in the subhepatic space.  It was secured to the skin with a 2-0  nylon suture.  All packs were removed.  Retractors were removed.  Abdominal wall was closed in two layers with running #1 Novafil suture.  Subcutaneous tissues were irrigated.  Subcostal incision and umbilical incision were closed with stainless steel  staples.  Sterile dressings were applied.  Drain was placed to bulb  suction.    The patient was awakened from anesthesia and brought to the recovery room.  The patient tolerated the procedure well.  Armandina Gemma, MD [facility] Office: [phone]    AN/NUANCE  D:08/19/2017 T:08/19/2017 JOB:[id number]

## 2017-08-19 NOTE — Brief Op Note (Signed)
08/19/2017  3:26 PM  PATIENT:  [name]  77 y.o. male  PRE-OPERATIVE DIAGNOSIS:  Biliary pancreatitis, cholelithiasis, chronic cholecystitis  POST-OPERATIVE DIAGNOSIS:  same  PROCEDURE:  1. Attempted laparoscopic cholecystectomy  2. Open cholecystectomy with IOC  3. Subhepatic drain placement  SURGEON:  Surgeon(s) and Role:    * Armandina Gemma, MD - Primary  ANESTHESIA:   general  EBL:  200 mL   BLOOD ADMINISTERED:none  DRAINS: (19Fr) Blake drain(s) in the subhepatic space   LOCAL MEDICATIONS USED:  NONE  SPECIMEN:  Excision  DISPOSITION OF SPECIMEN:  PATHOLOGY  COUNTS:  YES  TOURNIQUET:  * No tourniquets in log *  DICTATION: .Other Dictation: Dictation Number [id number]  PLAN OF CARE: Admit to inpatient   PATIENT DISPOSITION:  PACU - hemodynamically stable.   Delay start of Pharmacological VTE agent (>24hrs) due to surgical blood loss or risk of bleeding: yes  Armandina Gemma, MD [facility] Office: [phone]

## 2017-08-20 ENCOUNTER — Encounter (HOSPITAL_COMMUNITY): Payer: Self-pay | Admitting: Surgery

## 2017-08-20 LAB — COMPREHENSIVE METABOLIC PANEL
ALBUMIN: 3 g/dL — AB (ref 3.5–5.0)
ALK PHOS: 229 U/L — AB (ref 38–126)
ALT: 155 U/L — ABNORMAL HIGH (ref 17–63)
AST: 127 U/L — ABNORMAL HIGH (ref 15–41)
Anion gap: 11 (ref 5–15)
BUN: 15 mg/dL (ref 6–20)
CALCIUM: 8.4 mg/dL — AB (ref 8.9–10.3)
CO2: 20 mmol/L — AB (ref 22–32)
CREATININE: 1.13 mg/dL (ref 0.61–1.24)
Chloride: 107 mmol/L (ref 101–111)
GFR calc Af Amer: >60 mL/min (ref >60)
GFR calc non Af Amer: >60 mL/min (ref >60)
GLUCOSE: 173 mg/dL — AB (ref 65–99)
Potassium: 4.7 mmol/L (ref 3.5–5.1)
SODIUM: 138 mmol/L (ref 135–145)
Total Bilirubin: 1.5 mg/dL — ABNORMAL HIGH (ref 0.3–1.2)
Total Protein: 6.9 g/dL (ref 6.5–8.1)

## 2017-08-20 LAB — CBC WITH DIFFERENTIAL/PLATELET
Basophils Absolute: 0 10*3/uL (ref 0.0–0.1)
Basophils Relative: 0 %
EOS ABS: 0 10*3/uL (ref 0.0–0.7)
Eosinophils Relative: 0 %
HCT: 39.7 % (ref 39.0–52.0)
HEMOGLOBIN: 13.3 g/dL (ref 13.0–17.0)
LYMPHS ABS: 1 10*3/uL (ref 0.7–4.0)
Lymphocytes Relative: 8 %
MCH: 29.8 pg (ref 26.0–34.0)
MCHC: 33.5 g/dL (ref 30.0–36.0)
MCV: 89 fL (ref 78.0–100.0)
Monocytes Absolute: 0.5 10*3/uL (ref 0.1–1.0)
Monocytes Relative: 4 %
Neutro Abs: 10.8 10*3/uL — ABNORMAL HIGH (ref 1.7–7.7)
Neutrophils Relative %: 88 %
Platelets: 229 10*3/uL (ref 150–400)
RBC: 4.46 MIL/uL (ref 4.22–5.81)
RDW: 14.5 % (ref 11.5–15.5)
WBC: 12.3 10*3/uL — ABNORMAL HIGH (ref 4.0–10.5)

## 2017-08-20 LAB — MAGNESIUM: Magnesium: 2 mg/dL (ref 1.7–2.4)

## 2017-08-20 NOTE — Progress Notes (Signed)
PROGRESS NOTE [facility]   KOUA DEEG   GUY:[id number] DOB: 1940/06/20  DOA: 08/17/2017 PCP: Merrilee Seashore, MD   Brief Narrative:  [name] is a 77 year old male with medical history significant for hypertension, PPM and DVT on Eliquis.  Patient presented to the emergency department complaining of abdominal pain.  Upon ED evaluation LFTs were elevated along with T bili 7.7, lipase >3500.  Patient was admitted with working diagnosis of acute pancreatitis.  Ultrasound of the abdomen reviewed cholelithiasis with numerous of gallstone, no evidence of acute cholecystitis.  Right lower pole nephrolithiasis.  GI was consulted and patient underwent upper EUS.  General surgery consult  Subjective: Patient seen and examined, s/p lap chole 6/15.  Doing well, passing gas.  Ambulating in halls.  Denies abdominal pain, only soreness, no nausea or vomiting.  No acute events overnight.  Remains afebrile.  Assessment & Plan: Acute biliary pancreatitis due to cholelithiasis  Ultrasound with multiple gallstones, no stone on CBD. Status post laparoscopy, open cholecystectomy and drain placement  Diet advanced to clear, continue management per surgical team. LFTs improving, near normal  Hypertension BP slight above goal Continue losartan  Monitor BP closely  History of DVT diagnosed March 2019 Previously on Eliquis, continue to hold for now No need for heparin drip at this time. Defer to surgery when safely to start Eliquis  PPM due to symptomatic second-degree AV block Heart rate pacing well. Stable  DVT prophylaxis: SCDs Code Status: Full code Family Communication: None at bedside Disposition Plan: Home when surgically cleared   Consultants:   GI  General surgery  Procedures:   EUS 6/14  Antimicrobials:  None   Objective: Vitals:   08/19/17 2033 08/20/17 0033 08/20/17 0434 08/20/17 1001  BP: (!) 159/78 (!) 148/73 128/72 (!) 151/76  Pulse: 72 71 60  69  Resp: 17 18 19 14   Temp: 98.2 F (36.8 C) 98.4 F (36.9 C) 97.9 F (36.6 C) 97.9 F (36.6 C)  TempSrc: Oral Oral Oral Oral  SpO2: 97% 95% 97% 97%  Weight:      Height:        Intake/Output Summary (Last 24 hours) at 08/20/2017 1117 Last data filed at 08/20/2017 0900 Gross per 24 hour  Intake 2000 ml  Output 1565 ml  Net 435 ml   Filed Weights   08/17/17 0420 08/17/17 1401 08/18/17 0658  Weight: 109.8 kg (242 lb) 109.8 kg (242 lb) 109.8 kg (242 lb)    Examination:  General: Pt is alert, awake, not in acute distress Cardiovascular: RRR, S1/S2 +, no rubs, no gallops Respiratory: CTA bilaterally, no wheezing, no rhonchi Abdominal: Soft, NT, ND, bowel sounds +, drain in place in RUQ  Extremities: no edema.   Data Reviewed: I have personally reviewed following labs and imaging studies  CBC: Recent Labs  Lab 08/17/17 0408 08/18/17 0402 08/20/17 0450  WBC 9.9 12.7* 12.3*  NEUTROABS 7.7  --  10.8*  HGB 14.7 13.9 13.3  HCT 43.3 41.0 39.7  MCV 90.8 90.3 89.0  PLT 189 180 563   Basic Metabolic Panel: Recent Labs  Lab 08/17/17 0408 08/18/17 0402 08/19/17 0446 08/20/17 0450  NA 138 135 136 138  K 4.0 4.4 4.2 4.7  CL 101 103 106 107  CO2 25 26 23  20*  GLUCOSE 112* 91 112* 173*  BUN 15 14 12 15   CREATININE 0.79 0.97 0.84 1.13  CALCIUM 8.4* 8.1* 8.2* 8.4*  MG  --   --   --  2.0   GFR: Estimated Creatinine Clearance: 70.1 mL/min (by C-G formula based on SCr of 1.13 mg/dL). Liver Function Tests: Recent Labs  Lab 08/17/17 0408 08/18/17 0402 08/19/17 0446 08/20/17 0450  AST 177* 122* 54* 127*  ALT 279* 212* 143* 155*  ALKPHOS 284* 303* 259* 229*  BILITOT 7.7* 3.8* 2.1* 1.5*  PROT 6.9 6.7 6.6 6.9  ALBUMIN 3.5 3.1* 2.9* 3.0*   Recent Labs  Lab 08/17/17 0408 08/18/17 0402 08/19/17 0446  LIPASE 3,586* 383* 116*   No results for input(s): AMMONIA in the last 168 hours. Coagulation Profile: No results for input(s): INR, PROTIME in the last 168  hours. Cardiac Enzymes: No results for input(s): CKTOTAL, CKMB, CKMBINDEX, TROPONINI in the last 168 hours. BNP (last 3 results) No results for input(s): PROBNP in the last 8760 hours. HbA1C: No results for input(s): HGBA1C in the last 72 hours. CBG: No results for input(s): GLUCAP in the last 168 hours. Lipid Profile: No results for input(s): CHOL, HDL, LDLCALC, TRIG, CHOLHDL, LDLDIRECT in the last 72 hours. Thyroid Function Tests: No results for input(s): TSH, T4TOTAL, FREET4, T3FREE, THYROIDAB in the last 72 hours. Anemia Panel: No results for input(s): VITAMINB12, FOLATE, FERRITIN, TIBC, IRON, RETICCTPCT in the last 72 hours. Sepsis Labs: No results for input(s): PROCALCITON, LATICACIDVEN in the last 168 hours.  Recent Results (from the past 240 hour(s))  Urine culture     Status: None   Collection Time: 08/15/17  8:44 PM  Result Value Ref Range Status   Specimen Description URINE, RANDOM  Final   Special Requests NONE  Final   Culture   Final    NO GROWTH Performed at [facility], [address], [place], [place] [address]    Report Status 08/17/2017 FINAL  Final  Surgical pcr screen     Status: None   Collection Time: 08/19/17 10:55 AM  Result Value Ref Range Status   MRSA, PCR NEGATIVE NEGATIVE Final   Staphylococcus aureus NEGATIVE NEGATIVE Final    Comment: (NOTE) The Xpert SA Assay (FDA approved for NASAL specimens in patients 41 years of age and older), is one component of a comprehensive surveillance program. It is not intended to diagnose infection nor to guide or monitor treatment. Performed at [facility], [place] [address], [place], [place] [address]      Radiology Studies: Dg Cholangiogram Operative  Result Date: 08/19/2017 CLINICAL DATA:  77 year old male with a history of cholelithiasis EXAM: INTRAOPERATIVE CHOLANGIOGRAM TECHNIQUE: Cholangiographic images from the C-arm fluoroscopic device were submitted for interpretation  post-operatively. Please see the procedural report for the amount of contrast and the fluoroscopy time utilized. COMPARISON:  08/17/2017 FINDINGS: Surgical instruments project over the upper abdomen. There is cannulation of the cystic duct/gallbladder neck, with antegrade infusion of contrast. Caliber of the extrahepatic ductal system within normal limits. No definite filling defect within the extrahepatic ducts identified. Free flow of contrast across the ampulla. IMPRESSION: Intraoperative cholangiogram demonstrates extrahepatic biliary ducts of unremarkable caliber, with no definite filling defects identified. Free flow of contrast across the ampulla. Please refer to the dictated operative report for full details of intraoperative findings and procedure Electronically Signed   By: Corrie Mckusick D.O.   On: 08/19/2017 14:48    Scheduled Meds: . atorvastatin  20 mg Oral q1800  . enoxaparin (LOVENOX) injection  40 mg Subcutaneous Q24H  . indomethacin  100 mg Rectal Once  . losartan  100 mg Oral Daily  . multivitamin with minerals  1 tablet Oral Daily   Continuous Infusions: . dextrose 5 % and 0.45 % NaCl with KCl 20 mEq/L 50 mL/hr at 08/20/17 0953     LOS: 3 days    Time spent: Total of 15 minutes spent with pt, greater than 50% of which was spent in discussion of  treatment, counseling and coordination of care   Chipper Oman, MD Pager: Text Page via [email or web address]   If 7PM-7AM, please contact night-coverage [email or web address] 08/20/2017, 11:17 AM   Note - This record has been created using [employer]. Chart creation errors have been sought, but may not always have been located. Such creation errors do not reflect on the standard of medical care.

## 2017-08-20 NOTE — Progress Notes (Signed)
General Surgery [facility]  Assessment & Plan: POD#1 status post laparoscopy, open cholecystectomy with IOC, drain placement  Begin clear liquid diet  OOB, ambulating in halls  Monitor drain output until tolerating regular diet, then discontinue drain  LFT's improving this AM, near normal  Per medical service        Earnstine Regal, MD, [facility]       Office: [phone]    Chief Complaint: Biliary pancreatitis, chronic cholecystitis, cholelithiasis  Subjective: Patient up in chair, ambulating in halls.  Denies nausea or emesis, mild pain.  Objective: Vital signs in last 24 hours: Temp:  [97.9 F (36.6 C)-98.4 F (36.9 C)] 97.9 F (36.6 C) (06/16 0434) Pulse Rate:  [60-72] 60 (06/16 0434) Resp:  [8-19] 19 (06/16 0434) BP: (128-177)/(72-88) 128/72 (06/16 0434) SpO2:  [95 %-100 %] 97 % (06/16 0434) Last BM Date: 08/16/17  Intake/Output from previous day: 06/15 0701 - 06/16 0700 In: 2000 [I.V.:2000] Out: 1555 [Urine:1300; Drains:55; Blood:200] Intake/Output this shift: Total I/O In: -  Out: 10 [Drains:10]  Physical Exam: HEENT - sclerae clear, mucous membranes moist Neck - soft Chest - clear bilaterally Cor - RRR Abdomen - soft, protuberant; dressings dry and intact; small serosanguinous in JP drain; BS present Ext - no edema, non-tender Neuro - alert & oriented, no focal deficits  Lab Results:  Recent Labs    08/18/17 0402 08/20/17 0450  WBC 12.7* 12.3*  HGB 13.9 13.3  HCT 41.0 39.7  PLT 180 229   BMET Recent Labs    08/19/17 0446 08/20/17 0450  NA 136 138  K 4.2 4.7  CL 106 107  CO2 23 20*  GLUCOSE 112* 173*  BUN 12 15  CREATININE 0.84 1.13  CALCIUM 8.2* 8.4*   PT/INR No results for input(s): LABPROT, INR in the last 72 hours. Comprehensive Metabolic Panel:    Component Value Date/Time   NA 138 08/20/2017 0450   NA 136 08/19/2017 0446   NA 141 03/13/2017 1010   K 4.7  08/20/2017 0450   K 4.2 08/19/2017 0446   CL 107 08/20/2017 0450   CL 106 08/19/2017 0446   CO2 20 (L) 08/20/2017 0450   CO2 23 08/19/2017 0446   BUN 15 08/20/2017 0450   BUN 12 08/19/2017 0446   BUN 21 03/13/2017 1010   CREATININE 1.13 08/20/2017 0450   CREATININE 0.84 08/19/2017 0446   GLUCOSE 173 (H) 08/20/2017 0450   GLUCOSE 112 (H) 08/19/2017 0446   CALCIUM 8.4 (L) 08/20/2017 0450   CALCIUM 8.2 (L) 08/19/2017 0446   AST 127 (H) 08/20/2017 0450   AST 54 (H) 08/19/2017 0446   ALT 155 (H) 08/20/2017 0450   ALT 143 (H) 08/19/2017 0446   ALKPHOS 229 (H) 08/20/2017 0450   ALKPHOS 259 (H) 08/19/2017 0446   BILITOT 1.5 (H) 08/20/2017 0450   BILITOT 2.1 (H) 08/19/2017 0446   PROT 6.9 08/20/2017 0450   PROT 6.6 08/19/2017 0446   ALBUMIN 3.0 (L) 08/20/2017 0450   ALBUMIN 2.9 (L) 08/19/2017 0446    Studies/Results: Dg Cholangiogram Operative  Result Date: 08/19/2017 CLINICAL DATA:  77 year old male with a history of cholelithiasis EXAM: INTRAOPERATIVE CHOLANGIOGRAM TECHNIQUE: Cholangiographic images from the C-arm fluoroscopic device were submitted for interpretation post-operatively. Please see the procedural report for the amount of contrast and the fluoroscopy time utilized. COMPARISON:  08/17/2017 FINDINGS: Surgical instruments project over the upper abdomen. There is cannulation of  the cystic duct/gallbladder neck, with antegrade infusion of contrast. Caliber of the extrahepatic ductal system within normal limits. No definite filling defect within the extrahepatic ducts identified. Free flow of contrast across the ampulla. IMPRESSION: Intraoperative cholangiogram demonstrates extrahepatic biliary ducts of unremarkable caliber, with no definite filling defects identified. Free flow of contrast across the ampulla. Please refer to the dictated operative report for full details of intraoperative findings and procedure Electronically Signed   By: Corrie Mckusick D.O.   On: 08/19/2017 14:48       Krystiana Fornes M 08/20/2017  Patient ID: [name], male   DOB: 29-May-1940, 77 y.o.   MRN: [id number]

## 2017-08-21 ENCOUNTER — Encounter (HOSPITAL_COMMUNITY): Payer: Self-pay | Admitting: Gastroenterology

## 2017-08-21 NOTE — Progress Notes (Signed)
2 Days Post-Op    CC:  Abdominal pain  Subjective: Pt up in chair eating clears and says he is ready for more.  Incision looks fine, drainage is clear and serous.  He lives at home with his wife.    Objective: Vital signs in last 24 hours: Temp:  [97.7 F (36.5 C)-98.1 F (36.7 C)] 97.9 F (36.6 C) (06/17 0506) Pulse Rate:  [59-69] 59 (06/17 0506) Resp:  [14-20] 16 (06/17 0506) BP: (137-166)/(76-85) 164/76 (06/17 0506) SpO2:  [97 %-100 %] 100 % (06/17 0506) Last BM Date: 08/20/17 600 PO Drain 35 Stool 0 Afebrile, BP up some Glucose elevated  AST/ALT - 127/155 T bil down to 1.5 from 7.7 WBC 12.3  H/H 13.3/39 Intake/Output from previous day: 06/16 0701 - 06/17 0700 In: 600 [P.O.:600] Out: 235 [Urine:200; Drains:35] Intake/Output this shift: Total I/O In: -  Out: 300 [Urine:300]  General appearance: alert, cooperative and no distress Resp: clear to auscultation bilaterally Cardio: regular rate and rhythm, S1, S2 normal, no murmur, click, rub or gallop GI: soft sore, sites look fine, + BM tolerating clears well  Lab Results:  Recent Labs    08/20/17 0450  WBC 12.3*  HGB 13.3  HCT 39.7  PLT 229    BMET Recent Labs    08/19/17 0446 08/20/17 0450  NA 136 138  K 4.2 4.7  CL 106 107  CO2 23 20*  GLUCOSE 112* 173*  BUN 12 15  CREATININE 0.84 1.13  CALCIUM 8.2* 8.4*   PT/INR No results for input(s): LABPROT, INR in the last 72 hours.  Recent Labs  Lab 08/17/17 0408 08/18/17 0402 08/19/17 0446 08/20/17 0450  AST 177* 122* 54* 127*  ALT 279* 212* 143* 155*  ALKPHOS 284* 303* 259* 229*  BILITOT 7.7* 3.8* 2.1* 1.5*  PROT 6.9 6.7 6.6 6.9  ALBUMIN 3.5 3.1* 2.9* 3.0*     Lipase     Component Value Date/Time   LIPASE 116 (H) 08/19/2017 0446     Medications: . atorvastatin  20 mg Oral q1800  . enoxaparin (LOVENOX) injection  40 mg Subcutaneous Q24H  . indomethacin  100 mg Rectal Once  . losartan  100 mg Oral Daily  . multivitamin with minerals   1 tablet Oral Daily    Rocephin pre op  Assessment/Plan Hypertension - losartan Hx of DVT - Eliquis pre op - No heparin drip post op - ? Restart Eliquis Second degree AV block  - PTVP   Biliary pancreatitis, cholelithiasis, chronic cholecystitis Attempted laparoscopic cholecystectomy  2. Open cholecystectomy with IOC  3. Subhepatic drain placement, 08/19/17, Dr.Todd Gerkin  POD2  FEN: No IV fluids/Clears ID:  Pre op DVT:  Lovenox Follow up:  Dr.Gerkin   Plan:  Advance diet, if he does well may be able to send home in AM.  We will aim to pull drain before restarting Eliquis if drainage is still clear in AM.  We will get him follow up for staple removal on 6/24 and follow up with Dr. Harlow Asa.           LOS: 4 days    Sirena Riddle 08/21/2017 [phone]

## 2017-08-21 NOTE — Addendum Note (Signed)
Addendum  created 08/21/17 1827 by Lillia Abed, MD   Sign clinical note

## 2017-08-21 NOTE — Anesthesia Postprocedure Evaluation (Addendum)
Anesthesia Post Note  Patient: [name]  Procedure(s) Performed: ESOPHAGEAL ENDOSCOPIC ULTRASOUND (EUS) RADIAL (N/A )     Patient location during evaluation: PACU Anesthesia Type: MAC Level of consciousness: awake and alert Pain management: pain level controlled Vital Signs Assessment: post-procedure vital signs reviewed and stable Respiratory status: spontaneous breathing, nonlabored ventilation, respiratory function stable and patient connected to nasal cannula oxygen Cardiovascular status: stable and blood pressure returned to baseline Postop Assessment: no apparent nausea or vomiting Anesthetic complications: no    Last Vitals:  Vitals:   08/21/17 0506 08/21/17 1436  BP: (!) 164/76 123/63  Pulse: (!) 59 70  Resp: 16 20  Temp: 36.6 C 37.2 C  SpO2: 100% 100%    Last Pain:  Vitals:   08/21/17 1436  TempSrc: Oral  PainSc:                  Pebble Botkin DAVID

## 2017-08-21 NOTE — Progress Notes (Signed)
PROGRESS NOTE [facility]   [name]   SKA:[id number] DOB: 09-Mar-1940  DOA: 08/17/2017 PCP: Merrilee Seashore, MD   Brief Narrative:  [name] is a 77 year old male with medical history significant for hypertension, PPM and DVT on Eliquis.  Patient presented to the emergency department complaining of abdominal pain.  Upon ED evaluation LFTs were elevated along with T bili 7.7, lipase >3500.  Patient was admitted with working diagnosis of acute pancreatitis.  Ultrasound of the abdomen reviewed cholelithiasis with numerous of gallstone, no evidence of acute cholecystitis.  Right lower pole nephrolithiasis.  GI was consulted and patient underwent upper EUS.  General surgery consult  Subjective: Tolerating clears, draining very little clear answers.  Passing gas with bowel movements.  No acute events overnight  Assessment & Plan: Acute biliary pancreatitis due to cholelithiasis  Ultrasound with multiple gallstones, no stone on CBD. Status post laparoscopy, open cholecystectomy and drain placement  LFT's improved Advance diet as tolerated, likely to remote drive in a.m. hopefully home.  Hypertension BP stable Continue losartan  Monitor BP closely  History of DVT diagnosed March 2019 Previously on Eliquis, continue to hold for now Will discuss with surgery for when he safely to start Eliquis.  PPM due to symptomatic second-degree AV block Heart rate pacing well. Stable  DVT prophylaxis: SCDs Code Status: Full code Family Communication: None at bedside Disposition Plan: Home when surgically cleared   Consultants:   GI  General surgery  Procedures:   EUS 6/14  Antimicrobials:  None   Objective: Vitals:   08/20/17 1835 08/20/17 2100 08/21/17 0506 08/21/17 1436  BP: (!) 166/85 (!) 166/81 (!) 164/76 123/63  Pulse: 67 61 (!) 59 70  Resp: 20 20 16 20   Temp: 98 F (36.7 C) 98.1 F (36.7 C) 97.9 F (36.6 C) 98.9 F (37.2 C)  TempSrc: Oral  Oral Oral Oral  SpO2: 99% 98% 100% 100%  Weight:      Height:        Intake/Output Summary (Last 24 hours) at 08/21/2017 1928 Last data filed at 08/21/2017 1400 Gross per 24 hour  Intake 600 ml  Output 525 ml  Net 75 ml   Filed Weights   08/17/17 0420 08/17/17 1401 08/18/17 0658  Weight: 109.8 kg (242 lb) 109.8 kg (242 lb) 109.8 kg (242 lb)    Examination:  General: Pt is alert, awake, not in acute distress Cardiovascular: RRR, S1/S2 +, no rubs, no gallops Respiratory: CTA bilaterally, no wheezing, no rhonchi Abdominal: Soft, NT, ND, bowel sounds + drain in place, clear with serous Extremities: no edema  Data Reviewed: I have personally reviewed following labs and imaging studies  CBC: Recent Labs  Lab 08/17/17 0408 08/18/17 0402 08/20/17 0450  WBC 9.9 12.7* 12.3*  NEUTROABS 7.7  --  10.8*  HGB 14.7 13.9 13.3  HCT 43.3 41.0 39.7  MCV 90.8 90.3 89.0  PLT 189 180 203   Basic Metabolic Panel: Recent Labs  Lab 08/17/17 0408 08/18/17 0402 08/19/17 0446 08/20/17 0450  NA 138 135 136 138  K 4.0 4.4 4.2 4.7  CL 101 103 106 107  CO2 25 26 23  20*  GLUCOSE 112* 91 112* 173*  BUN 15 14 12 15   CREATININE 0.79 0.97 0.84 1.13  CALCIUM 8.4* 8.1* 8.2* 8.4*  MG  --   --   --  2.0   GFR: Estimated Creatinine Clearance: 70.1 mL/min (by C-G formula based on SCr of 1.13 mg/dL). Liver Function Tests: Recent Labs  Lab 08/17/17 0408 08/18/17 0402 08/19/17 0446 08/20/17 0450  AST 177* 122* 54* 127*  ALT 279* 212* 143* 155*  ALKPHOS 284* 303* 259* 229*  BILITOT 7.7* 3.8* 2.1* 1.5*  PROT 6.9 6.7 6.6 6.9  ALBUMIN 3.5 3.1* 2.9* 3.0*   Recent Labs  Lab 08/17/17 0408 08/18/17 0402 08/19/17 0446  LIPASE 3,586* 383* 116*   No results for input(s): AMMONIA in the last 168 hours. Coagulation Profile: No results for input(s): INR, PROTIME in the last 168 hours. Cardiac Enzymes: No results for input(s): CKTOTAL, CKMB, CKMBINDEX, TROPONINI in the last 168 hours. BNP (last  3 results) No results for input(s): PROBNP in the last 8760 hours. HbA1C: No results for input(s): HGBA1C in the last 72 hours. CBG: No results for input(s): GLUCAP in the last 168 hours. Lipid Profile: No results for input(s): CHOL, HDL, LDLCALC, TRIG, CHOLHDL, LDLDIRECT in the last 72 hours. Thyroid Function Tests: No results for input(s): TSH, T4TOTAL, FREET4, T3FREE, THYROIDAB in the last 72 hours. Anemia Panel: No results for input(s): VITAMINB12, FOLATE, FERRITIN, TIBC, IRON, RETICCTPCT in the last 72 hours. Sepsis Labs: No results for input(s): PROCALCITON, LATICACIDVEN in the last 168 hours.  Recent Results (from the past 240 hour(s))  Urine culture     Status: None   Collection Time: 08/15/17  8:44 PM  Result Value Ref Range Status   Specimen Description URINE, RANDOM  Final   Special Requests NONE  Final   Culture   Final    NO GROWTH Performed at [facility], [address] [place], [place] [address]    Report Status 08/17/2017 FINAL  Final  Surgical pcr screen     Status: None   Collection Time: 08/19/17 10:55 AM  Result Value Ref Range Status   MRSA, PCR NEGATIVE NEGATIVE Final   Staphylococcus aureus NEGATIVE NEGATIVE Final    Comment: (NOTE) The Xpert SA Assay (FDA approved for NASAL specimens in patients 52 years of age and older), is one component of a comprehensive surveillance program. It is not intended to diagnose infection nor to guide or monitor treatment. Performed at [facility], [place] [address] [place], [place] [address]      Radiology Studies: No results found.  Scheduled Meds: . atorvastatin  20 mg Oral q1800  . enoxaparin (LOVENOX) injection  40 mg Subcutaneous Q24H  . indomethacin  100 mg Rectal Once  . losartan  100 mg Oral Daily  . multivitamin with minerals  1 tablet Oral Daily   Continuous Infusions:    LOS: 4 days    Time spent: Total of 15 minutes spent with pt, greater than 50% of which was spent  in discussion of  treatment, counseling and coordination of care   Chipper Oman, MD Pager: Text Page via [email or web address]   If 7PM-7AM, please contact night-coverage [email or web address] 08/21/2017, 7:28 PM   Note - This record has been created using [employer]. Chart creation errors have been sought, but may not always have been located. Such creation errors do not reflect on the standard of medical care.

## 2017-08-22 MED ORDER — ACETAMINOPHEN 325 MG PO TABS: 650.0000 mg | ORAL_TABLET | Freq: Four times a day (QID) | ORAL | Status: DC | PRN

## 2017-08-22 MED ORDER — APIXABAN 5 MG PO TABS
5.0000 mg | ORAL_TABLET | Freq: Two times a day (BID) | ORAL | Status: DC
  Administered 2017-08-22: 5 mg via ORAL
  Filled 2017-08-22: qty 1

## 2017-08-22 MED ORDER — TRAMADOL HCL 50 MG PO TABS: 50.0000 mg | ORAL_TABLET | Freq: Four times a day (QID) | ORAL | 0 refills | Status: DC | PRN

## 2017-08-22 NOTE — Discharge Summary (Signed)
Physician Discharge Summary  [name]  LDJ:[id number]  DOB: 02-Jan-1941  DOA: 08/17/2017 PCP: Merrilee Seashore, MD  Admit date: 08/17/2017 Discharge date: 08/22/2017  Admitted From: Home  Disposition:  Home   Recommendations for Outpatient Follow-up:  1. Follow up with PCP in 1 week  2. Follow up with surgery on 6/24 for post op eval  3. Please obtain CMP/CBC in one week to monitor liver function and Hgb   Discharge Condition: Stable  CODE STATUS: Full Code   Diet recommendation: Heart Healthy / Carb Modified / Regular / Dysphagia   Brief/Interim Summary: For full details see H&P/Progress note, but in brief, [name] is a 77 year old male with medical history significant for hypertension, PPM and DVT on Eliquis.  Patient presented to the emergency department complaining of abdominal pain.  Upon ED evaluation LFTs were elevated along with T bili 7.7, lipase >3500.  Patient was admitted with working diagnosis of acute pancreatitis.  Ultrasound of the abdomen reviewed cholelithiasis with numerous of gallstone, no evidence of acute cholecystitis.  Right lower pole nephrolithiasis.  GI was consulted and patient underwent upper EUS.  General surgery consulted, patient underwent laparoscopy with open cholecystectomy and drain placement. Two days post op drain was removed and patient was tolerating diet well. He was deemed stable for discharge.  Subjective: Patient seen and examined, doing well.  Tolerating diet.  Having regular bowel movement and passing gas.  Denies abdominal pain.  Drain has been removed.  Discharge Diagnoses/Hospital Course:  Acute biliary pancreatitis due to cholelithiasis  Ultrasound with multiple gallstones, no stone on CBD. EUS no obstructing stones noted  Status post laparoscopy, open cholecystectomy and drain placement - drain removed  LFT's improved. Tolerating diet well. Surgical follow up in 1 week   Hypertension BP stable during hospital  stay Continue losartan  Follow up with PCP  History of DVT diagnosed March 2019 Eliquis was held during hospital stay due to procedures [facility] to resume Eliquis upon discharge.   PPM due to symptomatic second-degree AV block Heart rate pacing well. Stable. Outpatient follow up   All other chronic medical condition were stable during the hospitalization.  On the day of the discharge the patient's vitals were stable, and no other acute medical condition were reported by patient. the patient was felt safe to be discharge to home.   Discharge Instructions  You were cared for by a hospitalist during your hospital stay. If you have any questions about your discharge medications or the care you received while you were in the hospital after you are discharged, you can call the unit and asked to speak with the hospitalist on call if the hospitalist that took care of you is not available. Once you are discharged, your primary care physician will handle any further medical issues. Please note that NO REFILLS for any discharge medications will be authorized once you are discharged, as it is imperative that you return to your primary care physician (or establish a relationship with a primary care physician if you do not have one) for your aftercare needs so that they can reassess your need for medications and monitor your lab values.   Allergies as of 08/22/2017   No Known Allergies     Medication List    TAKE these medications   acetaminophen 325 MG tablet Commonly known as:  TYLENOL Take 2 tablets (650 mg total) by mouth every 6 (six) hours as needed for mild pain, moderate pain, fever or headache (or Fever >/=  101).   atorvastatin 20 MG tablet Commonly known as:  LIPITOR Take 20 mg by mouth every morning.   ELIQUIS 5 MG Tabs tablet Generic drug:  apixaban 1 TABLET TWICE A DAY 30 DAYS   losartan 100 MG tablet Commonly known as:  COZAAR Take 100 mg by mouth daily.   multivitamins ther.  w/minerals Tabs tablet Take 1 tablet by mouth daily.   nitroGLYCERIN 0.4 MG SL tablet Commonly known as:  NITROSTAT Place 1 tablet (0.4 mg total) under the tongue every 5 (five) minutes as needed for chest pain.   potassium chloride SA 20 MEQ tablet Commonly known as:  K-DUR,KLOR-CON Take 20 mEq by mouth every morning.   traMADol 50 MG tablet Commonly known as:  ULTRAM Take 1 tablet (50 mg total) by mouth every 6 (six) hours as needed (mild pain not relieved by tylenol).      Follow-up Information    [facility], [place] Follow up on 08/28/2017.   Specialty:  General Surgery Why:  You will see the nurse for staple removal. Your appointment is at 2:00 PM  Be at the office 30 minutes early for check in. Bring photo ID and insurance information.   Contact information: [place] [address] [phone]        Armandina Gemma, MD Follow up on 09/05/2017.   Specialty:  General Surgery Why:  Your appointment is at 3:15PM.  Be at the office 30 minutes early for check in.  Bring photo ID and insurance information.   Contact information: [address] [place] [address] [phone]        Merrilee Seashore, MD. Schedule an appointment as soon as possible for a visit in 1 week(s).   Specialty:  Internal Medicine Why:  Hospital follow up  Contact information: [address] [place] [address] [phone]        Sanda Klein, MD .   Specialty:  Cardiology Contact information: [address] [place] [address] [phone]          No Known Allergies  Consultations:  Neurosurgery  GI   Procedures/Studies: Dg Cholangiogram Operative  Result Date: 08/19/2017 CLINICAL DATA:  77 year old male with a history of cholelithiasis EXAM: INTRAOPERATIVE CHOLANGIOGRAM TECHNIQUE: Cholangiographic images from the C-arm fluoroscopic device were submitted for interpretation post-operatively.  Please see the procedural report for the amount of contrast and the fluoroscopy time utilized. COMPARISON:  08/17/2017 FINDINGS: Surgical instruments project over the upper abdomen. There is cannulation of the cystic duct/gallbladder neck, with antegrade infusion of contrast. Caliber of the extrahepatic ductal system within normal limits. No definite filling defect within the extrahepatic ducts identified. Free flow of contrast across the ampulla. IMPRESSION: Intraoperative cholangiogram demonstrates extrahepatic biliary ducts of unremarkable caliber, with no definite filling defects identified. Free flow of contrast across the ampulla. Please refer to the dictated operative report for full details of intraoperative findings and procedure Electronically Signed   By: Corrie Mckusick D.O.   On: 08/19/2017 14:48   US Abdomen Complete  Result Date: 08/17/2017 CLINICAL DATA:  Jaundice, pancreatitis EXAM: ABDOMEN ULTRASOUND COMPLETE COMPARISON:  CT 02/15/2015 FINDINGS: Gallbladder: Numerous small stones filling the gallbladder, the largest 9 mm. The gallbladder fundus is not well visualized due to overlying shadowing. Gallbladder wall borderline in thickness at 3 mm. Negative sonographic Murphy's. Common bile duct: Diameter: Normal caliber, 6 mm Liver: Increased echotexture compatible with fatty infiltration. No focal abnormality or biliary ductal dilatation.  Portal vein is patent on color Doppler imaging with normal direction of blood flow towards the liver. IVC: No abnormality visualized. Pancreas: Pancreas not well visualized due to overlying bowel gas. Visualized portions unremarkable. No ductal dilatation. Spleen: Size and appearance within normal limits. Right Kidney: Length: 12.3 cm. Shadowing stone in the lower pole. No hydronephrosis. Normal echotexture. Left Kidney: Length: 12.9 cm. Echogenicity within normal limits. No mass or hydronephrosis visualized. Abdominal aorta: No aneurysm visualized. Other findings:  None. IMPRESSION: Cholelithiasis with numerous gallstones. No sonographic evidence of acute cholecystitis. Fatty liver. Right lower pole nephrolithiasis. Electronically Signed   By: Rolm Baptise M.D.   On: 08/17/2017 11:11   Dg Abd 2 Views  Result Date: 08/15/2017 CLINICAL DATA:  Abdominal pain. EXAM: ABDOMEN - 2 VIEW COMPARISON:  September 23, 2015 FINDINGS: Mild fecal loading in the colon. Stones remain in the lower right kidney. No bowel obstruction. No free air, portal venous gas, or pneumatosis. No other acute abnormalities. IMPRESSION: Mild fecal loading in the colon.  Right renal stones. Electronically Signed   By: Dorise Bullion III M.D   On: 08/15/2017 21:06     Discharge Exam: Vitals:   08/21/17 2133 08/22/17 0510  BP: (!) 159/76 137/69  Pulse: (!) 59 62  Resp: 16 16  Temp: 98.9 F (37.2 C) 98.8 F (37.1 C)  SpO2: 99% 98%   Vitals:   08/21/17 0506 08/21/17 1436 08/21/17 2133 08/22/17 0510  BP: (!) 164/76 123/63 (!) 159/76 137/69  Pulse: (!) 59 70 (!) 59 62  Resp: 16 20 16 16   Temp: 97.9 F (36.6 C) 98.9 F (37.2 C) 98.9 F (37.2 C) 98.8 F (37.1 C)  TempSrc: Oral Oral Oral Oral  SpO2: 100% 100% 99% 98%  Weight:      Height:        General: Pt is alert, awake, not in acute distress Cardiovascular: RRR, S1/S2 +, no rubs, no gallops Respiratory: CTA bilaterally, no wheezing, no rhonchi Abdominal: Soft, NT, ND, bowel sounds + dressing in place clean and dry. Extremities: no edema, no cyanosis  The results of significant diagnostics from this hospitalization (including imaging, microbiology, ancillary and laboratory) are listed below for reference.     Microbiology: Recent Results (from the past 240 hour(s))  Urine culture     Status: None   Collection Time: 08/15/17  8:44 PM  Result Value Ref Range Status   Specimen Description URINE, RANDOM  Final   Special Requests NONE  Final   Culture   Final    NO GROWTH Performed at [facility], [address] [place], [place] [address]    Report Status 08/17/2017 FINAL  Final  Surgical pcr screen     Status: None   Collection Time: 08/19/17 10:55 AM  Result Value Ref Range Status   MRSA, PCR NEGATIVE NEGATIVE Final   Staphylococcus aureus NEGATIVE NEGATIVE Final    Comment: (NOTE) The Xpert SA Assay (FDA approved for NASAL specimens in patients 17 years of age and older), is one component of a comprehensive surveillance program. It is not intended to diagnose infection nor to guide or monitor treatment. Performed at [facility], [place] [address] [place], [place] [address]      Labs: BNP (last 3 results) No results for input(s): BNP in the last 8760 hours. Basic Metabolic Panel: Recent Labs  Lab 08/17/17 0408 08/18/17 0402 08/19/17 0446 08/20/17 0450  NA 138 135 136 138  K 4.0 4.4 4.2 4.7  CL 101 103 106 107  CO2 25 26 23  20*  GLUCOSE 112* 91 112* 173*  BUN 15 14 12 15   CREATININE 0.79 0.97 0.84 1.13  CALCIUM 8.4* 8.1* 8.2* 8.4*  MG  --   --   --  2.0   Liver Function Tests: Recent Labs  Lab 08/17/17 0408 08/18/17 0402 08/19/17 0446 08/20/17 0450  AST 177* 122* 54* 127*  ALT 279* 212* 143* 155*  ALKPHOS 284* 303* 259* 229*  BILITOT 7.7* 3.8* 2.1* 1.5*  PROT 6.9 6.7 6.6 6.9  ALBUMIN 3.5 3.1* 2.9* 3.0*   Recent Labs  Lab 08/17/17 0408 08/18/17 0402 08/19/17 0446  LIPASE 3,586* 383* 116*   No results for input(s): AMMONIA in the last 168 hours. CBC: Recent Labs  Lab 08/17/17 0408 08/18/17 0402 08/20/17 0450  WBC 9.9 12.7* 12.3*  NEUTROABS 7.7  --  10.8*  HGB 14.7 13.9 13.3  HCT 43.3 41.0 39.7  MCV 90.8 90.3 89.0  PLT 189 180 229   Cardiac Enzymes: No results for input(s): CKTOTAL, CKMB, CKMBINDEX, TROPONINI in the last 168 hours. BNP: Invalid input(s): POCBNP CBG: No results for input(s): GLUCAP in the last 168 hours. D-Dimer No results for input(s): DDIMER in the last 72 hours. Hgb A1c No results for input(s): HGBA1C in the  last 72 hours. Lipid Profile No results for input(s): CHOL, HDL, LDLCALC, TRIG, CHOLHDL, LDLDIRECT in the last 72 hours. Thyroid function studies No results for input(s): TSH, T4TOTAL, T3FREE, THYROIDAB in the last 72 hours.  Invalid input(s): FREET3 Anemia work up No results for input(s): VITAMINB12, FOLATE, FERRITIN, TIBC, IRON, RETICCTPCT in the last 72 hours. Urinalysis    Component Value Date/Time   COLORURINE AMBER (A) 08/17/2017 0400   APPEARANCEUR CLEAR 08/17/2017 0400   LABSPEC 1.017 08/17/2017 0400   PHURINE 8.0 08/17/2017 0400   GLUCOSEU NEGATIVE 08/17/2017 0400   HGBUR LARGE (A) 08/17/2017 0400   BILIRUBINUR NEGATIVE 08/17/2017 0400   KETONESUR NEGATIVE 08/17/2017 0400   PROTEINUR 30 (A) 08/17/2017 0400   UROBILINOGEN >=8.0 08/15/2017 2034   NITRITE NEGATIVE 08/17/2017 0400   LEUKOCYTESUR NEGATIVE 08/17/2017 0400   Sepsis Labs Invalid input(s): PROCALCITONIN,  WBC,  LACTICIDVEN Microbiology Recent Results (from the past 240 hour(s))  Urine culture     Status: None   Collection Time: 08/15/17  8:44 PM  Result Value Ref Range Status   Specimen Description URINE, RANDOM  Final   Special Requests NONE  Final   Culture   Final    NO GROWTH Performed at [facility], [address] [place], [place] [address]    Report Status 08/17/2017 FINAL  Final  Surgical pcr screen     Status: None   Collection Time: 08/19/17 10:55 AM  Result Value Ref Range Status   MRSA, PCR NEGATIVE NEGATIVE Final   Staphylococcus aureus NEGATIVE NEGATIVE Final    Comment: (NOTE) The Xpert SA Assay (FDA approved for NASAL specimens in patients 65 years of age and older), is one component of a comprehensive surveillance program. It is not intended to diagnose infection nor to guide or monitor treatment. Performed at [facility], [place] [address], [place], [place] [address]     Time coordinating discharge: 32 minutes  SIGNED:  Chipper Oman, MD  [facility] 08/22/2017, 8:24 AM  Pager please text page via  [email or web address]  Note - This record has been created using [employer]. Chart creation errors have been sought, but may not always have  been located. Such creation errors do not reflect on the standard of medical care.

## 2017-08-22 NOTE — Progress Notes (Signed)
Discharge intructions given, patient verbalized understanding ,

## 2017-08-22 NOTE — Progress Notes (Addendum)
3 Days Post-Op    CC:  Abdominal pain  Subjective: Tolerating diet, walked some yesterday, has had a BM.  Says he does not want any broth once he gets home.  Drainage from JP is serosanguinous.  About 5 cc in bulb this AM.  Objective: Vital signs in last 24 hours: Temp:  [98.8 F (37.1 C)-98.9 F (37.2 C)] 98.8 F (37.1 C) (06/18 0510) Pulse Rate:  [59-70] 62 (06/18 0510) Resp:  [16-20] 16 (06/18 0510) BP: (123-159)/(63-76) 137/69 (06/18 0510) SpO2:  [98 %-100 %] 98 % (06/18 0510) Last BM Date: 08/21/17 480 PO recorded 600 urine recorded 60 from the drain No BM recorded Afebrile, VSS No labs today PTW:[id number] cholangiogram demonstrates extrahepatic biliary ducts of unremarkable caliber, with no definite filling defects identified. Free flow of contrast across the ampulla.  Intake/Output from previous day: 06/17 0701 - 06/18 0700 In: 480 [P.O.:480] Out: 660 [Urine:600; Drains:60] Intake/Output this shift: No intake/output data recorded.  General appearance: alert, cooperative and no distress Resp: clear to auscultation bilaterally and down some in the bases, no IS noted in the room. GI: soft, + BS, drain is serosanguinous, Tolerating diet and having BM.  Staple lines both incisions look fine.  Lab Results:  Recent Labs    08/20/17 0450  WBC 12.3*  HGB 13.3  HCT 39.7  PLT 229    BMET Recent Labs    08/20/17 0450  NA 138  K 4.7  CL 107  CO2 20*  GLUCOSE 173*  BUN 15  CREATININE 1.13  CALCIUM 8.4*   PT/INR No results for input(s): LABPROT, INR in the last 72 hours.  Recent Labs  Lab 08/17/17 0408 08/18/17 0402 08/19/17 0446 08/20/17 0450  AST 177* 122* 54* 127*  ALT 279* 212* 143* 155*  ALKPHOS 284* 303* 259* 229*  BILITOT 7.7* 3.8* 2.1* 1.5*  PROT 6.9 6.7 6.6 6.9  ALBUMIN 3.5 3.1* 2.9* 3.0*     Lipase     Component Value Date/Time   LIPASE 116 (H) 08/19/2017 0446     Medications: . atorvastatin  20 mg Oral q1800  . enoxaparin  (LOVENOX) injection  40 mg Subcutaneous Q24H  . indomethacin  100 mg Rectal Once  . losartan  100 mg Oral Daily  . multivitamin with minerals  1 tablet Oral Daily    Assessment/Plan Hypertension - losartan Hx of DVT - Eliquis pre op - No heparin drip post op - Restart Eliquis today. Second degree AV block  - PTVP  Biliary pancreatitis, cholelithiasis, chronic cholecystitis Attempted laparoscopic cholecystectomy 2. Open cholecystectomy with IOC 3. Subhepatic drain placement, 08/19/17, Dr.Todd Gerkin  POD3  FEN: No IV fluids/soft diet ID:  Pre op DVT:  Lovenox Follow up:  [place]:  I think he can go home today if OK with Medicine.  I have pulled his drain and redressed site.  I have given him discharge instructions for home for his surgical procedure.  I have follow up instructions in the AVS.  You can restart Eliquis today. He is not taking anything for pain but I will leave Tylenol and Tramadol on the discharge instructions.  I have personally reviewed the patients medication history on the [place] controlled substance database.    LOS: 5 days    [name] 08/22/2017 [phone]

## 2017-08-22 NOTE — Care Management Important Message (Signed)
Important Message  Patient Details  Name: [name] MRN: [id number] Date of Birth: [birth year]   Medicare Important Message Given:  Yes    Kerin Salen 08/22/2017, 10:16 AMImportant Message  Patient Details  Name: [name] MRN: [id number] Date of Birth: 18-Aug-1940   Medicare Important Message Given:  Yes    Kerin Salen 08/22/2017, 10:16 AM

## 2017-08-22 NOTE — Discharge Instructions (Signed)
[facility], [place] [phone]  OPEN ABDOMINAL SURGERY: POST OP INSTRUCTIONS No driving until you are not having any pain, and not taking any medicines for pain.    Always review your discharge instruction sheet given to you by the facility where your surgery was performed.  IF YOU HAVE DISABILITY OR FAMILY LEAVE FORMS, YOU MUST BRING THEM TO THE OFFICE FOR PROCESSING.  PLEASE DO NOT GIVE THEM TO YOUR DOCTOR.  1. A prescription for pain medication may be given to you upon discharge.  Take your pain medication as prescribed, if needed.  If narcotic pain medicine is not needed, then you may take acetaminophen (Tylenol) or ibuprofen (Advil) as needed. 2. Take your usually prescribed medications unless otherwise directed. 3. If you need a refill on your pain medication, please contact your pharmacy. They will contact our office to request authorization.  Prescriptions will not be filled after 5pm or on week-ends. 4. You should follow a light diet the first few days after arrival home, such as soup and crackers, pudding, etc.unless your doctor has advised otherwise. A high-fiber, low fat diet can be resumed as tolerated.   Be sure to include lots of fluids daily. Most patients will experience some swelling and bruising on the chest and neck area.  Ice packs will help.  Swelling and bruising can take several days to resolve 5. Most patients will experience some swelling and bruising in the area of the incision. Ice pack will help. Swelling and bruising can take several days to resolve..  6. It is common to experience some constipation if taking pain medication after surgery.  Increasing fluid intake and taking a stool softener will usually help or prevent this problem from occurring.  A mild laxative (Milk of Magnesia or Miralax) should be taken according to package directions if there are no bowel movements after 48 hours. 7.  You may have steri-strips (small skin tapes) in place  directly over the incision.  These strips should be left on the skin for 7-10 days.  If your surgeon used skin glue on the incision, you may shower in 24 hours.  The glue will flake off over the next 2-3 weeks.  Any sutures or staples will be removed at the office during your follow-up visit. You may find that a light gauze bandage over your incision may keep your staples from being rubbed or pulled. You may shower and replace the bandage daily. 8. ACTIVITIES:  You may resume regular (light) daily activities beginning the next day--such as daily self-care, walking, climbing stairs--gradually increasing activities as tolerated.  You may have sexual intercourse when it is comfortable.  Refrain from any heavy lifting or straining until approved by your doctor. a. You may drive when you no longer are taking prescription pain medication, you can comfortably wear a seatbelt, and you can safely maneuver your car and apply brakes b. Return to Work: ___________________________________ 81. You should see your doctor in the office for a follow-up appointment approximately two weeks after your surgery.  Make sure that you call for this appointment within a day or two after you arrive home to insure a convenient appointment time. OTHER INSTRUCTIONS:  _____________________________________________________________ _____________________________________________________________  WHEN TO CALL YOUR DOCTOR: 1. Fever over 101.0 2. Inability to urinate 3. Nausea and/or vomiting 4. Extreme swelling or bruising 5. Continued bleeding from incision. 6. Increased pain, redness, or drainage from the incision. 7. Difficulty swallowing or breathing 8. Muscle cramping or spasms.  9. Numbness or tingling in hands or feet or around lips.  The clinic staff is available to answer your questions during regular business hours.  Please dont hesitate to call and ask to speak to one of the nurses if you have concerns.  For further  questions, please visit [email or web address]    Open Cholecystectomy, Care After This sheet gives you information about how to care for yourself after your procedure. Your health care provider may also give you more specific instructions. If you have problems or questions, contact your health care provider. What can I expect after the procedure? After the procedure, it is common to have:  Pain at your incision site. You will be given medicines to control this pain.  Mild nausea or vomiting.  Follow these instructions at home: Incision care   Follow instructions from your health care provider about how to take care of your incision. Make sure you: ? Wash your hands with soap and water before you change your bandage (dressing). If soap and water are not available, use hand sanitizer. ? Change your dressing as told by your health care provider. ? Leave stitches (sutures), skin glue, or adhesive strips in place. These skin closures may need to be in place for 2 weeks or longer. If adhesive strip edges start to loosen and curl up, you may trim the loose edges. Do not remove adhesive strips completely unless your health care provider tells you to do that.  Do not take baths, swim, or use a hot tub until your health care provider approves. Ask your health care provider if you can take showers. You may only be allowed to take sponge baths for bathing.  Check your incision area every day for signs of infection. Check for: ? More redness, swelling, or pain. ? More fluid or blood. ? Warmth. ? Pus or a bad smell. Activity  Do not drive or use heavy machinery while taking prescription pain medicine.  Do not lift anything that is heavier than 10 lb (4.5 kg) until your health care provider approves.  Do not play contact sports until your health care provider approves.  Do not drive for 24 hours if you were given a medicine to help you relax (sedative).  Rest as needed. Do not return  to work or school until your health care provider approves. General instructions  Take over-the-counter and prescription medicines only as told by your health care provider.  To prevent or treat constipation while you are taking prescription pain medicine, your health care provider may recommend that you: ? Drink enough fluid to keep your urine clear or pale yellow. ? Take over-the-counter or prescription medicines. ? Eat foods that are high in fiber, such as fresh fruits and vegetables, whole grains, and beans. ? Limit foods that are high in fat and processed sugars, such as fried and sweet foods. Contact a health care provider if:  You develop a rash.  You have more redness, swelling, or pain around your incision.  You have more fluid or blood coming from your incision.  Your incision feels warm to the touch.  You have pus or a bad smell coming from your incision.  You have a fever.  Your incision breaks open. Get help right away if:  You have trouble breathing.  You have chest pain.  You have increasing pain in your shoulders.  You faint or feel dizzy when you stand.  You have severe pain in your abdomen.  You have nausea or vomiting  that lasts for more than one day.  You have leg pain. This information is not intended to replace advice given to you by your health care provider. Make sure you discuss any questions you have with your health care provider. Document Released: 06/09/2003 Document Revised: 09/12/2015 Document Reviewed: 08/10/2015 Elsevier Interactive Patient Education  2018 [employer].

## 2017-08-23 NOTE — Progress Notes (Signed)
Please contact patient and notify of benign pathology results.  Damiyah Ditmars M. Brodie Correll, MD, FACS [facility] Office: [phone]   

## 2017-08-30 DIAGNOSIS — I10 Essential (primary) hypertension: Secondary | ICD-10-CM | POA: Diagnosis not present

## 2017-08-30 DIAGNOSIS — R7303 Prediabetes: Secondary | ICD-10-CM | POA: Diagnosis not present

## 2017-08-30 DIAGNOSIS — E782 Mixed hyperlipidemia: Secondary | ICD-10-CM | POA: Diagnosis not present

## 2017-09-05 DIAGNOSIS — E782 Mixed hyperlipidemia: Secondary | ICD-10-CM | POA: Diagnosis not present

## 2017-09-05 DIAGNOSIS — R7303 Prediabetes: Secondary | ICD-10-CM | POA: Diagnosis not present

## 2017-09-05 DIAGNOSIS — M159 Polyosteoarthritis, unspecified: Secondary | ICD-10-CM | POA: Diagnosis not present

## 2017-09-05 DIAGNOSIS — M1000 Idiopathic gout, unspecified site: Secondary | ICD-10-CM | POA: Diagnosis not present

## 2017-09-05 DIAGNOSIS — G4733 Obstructive sleep apnea (adult) (pediatric): Secondary | ICD-10-CM | POA: Diagnosis not present

## 2017-09-05 DIAGNOSIS — K859 Acute pancreatitis without necrosis or infection, unspecified: Secondary | ICD-10-CM | POA: Diagnosis not present

## 2017-09-10 DIAGNOSIS — G4733 Obstructive sleep apnea (adult) (pediatric): Secondary | ICD-10-CM | POA: Diagnosis not present

## 2017-09-13 DIAGNOSIS — M79671 Pain in right foot: Secondary | ICD-10-CM | POA: Diagnosis not present

## 2017-09-13 DIAGNOSIS — M86471 Chronic osteomyelitis with draining sinus, right ankle and foot: Secondary | ICD-10-CM | POA: Diagnosis not present

## 2017-09-13 DIAGNOSIS — I82412 Acute embolism and thrombosis of left femoral vein: Secondary | ICD-10-CM | POA: Diagnosis not present

## 2017-09-18 ENCOUNTER — Other Ambulatory Visit: Payer: Self-pay | Admitting: Internal Medicine

## 2017-09-18 DIAGNOSIS — M86471 Chronic osteomyelitis with draining sinus, right ankle and foot: Secondary | ICD-10-CM

## 2017-09-21 ENCOUNTER — Other Ambulatory Visit: Payer: Self-pay | Admitting: Internal Medicine

## 2017-09-21 DIAGNOSIS — M79671 Pain in right foot: Secondary | ICD-10-CM

## 2017-09-21 DIAGNOSIS — M86471 Chronic osteomyelitis with draining sinus, right ankle and foot: Secondary | ICD-10-CM

## 2017-09-21 DIAGNOSIS — I82412 Acute embolism and thrombosis of left femoral vein: Secondary | ICD-10-CM | POA: Diagnosis not present

## 2017-09-21 DIAGNOSIS — L03115 Cellulitis of right lower limb: Secondary | ICD-10-CM | POA: Diagnosis not present

## 2017-09-28 ENCOUNTER — Other Ambulatory Visit: Payer: Medicare HMO

## 2017-09-28 ENCOUNTER — Ambulatory Visit
Admission: RE | Admit: 2017-09-28 | Discharge: 2017-09-28 | Disposition: A | Discharge status: Home / Self Care | Payer: Medicare HMO | Source: Ambulatory Visit | Attending: Internal Medicine | Admitting: Internal Medicine

## 2017-09-28 DIAGNOSIS — M79671 Pain in right foot: Secondary | ICD-10-CM

## 2017-09-28 DIAGNOSIS — M86471 Chronic osteomyelitis with draining sinus, right ankle and foot: Secondary | ICD-10-CM

## 2017-09-28 DIAGNOSIS — S91301A Unspecified open wound, right foot, initial encounter: Secondary | ICD-10-CM | POA: Diagnosis not present

## 2017-09-28 MED ORDER — IOPAMIDOL (ISOVUE-300) INJECTION 61%
80.0000 mL | Freq: Once | INTRAVENOUS | Status: AC | PRN
  Administered 2017-09-28: 80 mL via INTRAVENOUS

## 2017-10-11 DIAGNOSIS — G4733 Obstructive sleep apnea (adult) (pediatric): Secondary | ICD-10-CM | POA: Diagnosis not present

## 2017-10-11 DIAGNOSIS — M7989 Other specified soft tissue disorders: Secondary | ICD-10-CM | POA: Diagnosis not present

## 2017-10-11 DIAGNOSIS — Z8739 Personal history of other diseases of the musculoskeletal system and connective tissue: Secondary | ICD-10-CM | POA: Diagnosis not present

## 2017-10-11 DIAGNOSIS — M064 Inflammatory polyarthropathy: Secondary | ICD-10-CM | POA: Diagnosis not present

## 2017-10-11 DIAGNOSIS — M19042 Primary osteoarthritis, left hand: Secondary | ICD-10-CM | POA: Diagnosis not present

## 2017-10-11 DIAGNOSIS — M1990 Unspecified osteoarthritis, unspecified site: Secondary | ICD-10-CM | POA: Diagnosis not present

## 2017-10-11 DIAGNOSIS — M79641 Pain in right hand: Secondary | ICD-10-CM | POA: Diagnosis not present

## 2017-10-11 DIAGNOSIS — M79642 Pain in left hand: Secondary | ICD-10-CM | POA: Diagnosis not present

## 2017-10-11 DIAGNOSIS — M19041 Primary osteoarthritis, right hand: Secondary | ICD-10-CM | POA: Diagnosis not present

## 2017-10-12 ENCOUNTER — Ambulatory Visit (HOSPITAL_COMMUNITY)
Admission: RE | Admit: 2017-10-12 | Discharge: 2017-10-12 | Disposition: A | Discharge status: Home / Self Care | Payer: Medicare HMO | Source: Ambulatory Visit | Attending: Vascular Surgery | Admitting: Vascular Surgery

## 2017-10-12 ENCOUNTER — Other Ambulatory Visit (HOSPITAL_COMMUNITY): Payer: Self-pay | Admitting: Internal Medicine

## 2017-10-12 DIAGNOSIS — I82412 Acute embolism and thrombosis of left femoral vein: Secondary | ICD-10-CM | POA: Diagnosis present

## 2017-10-12 DIAGNOSIS — I824Z9 Acute embolism and thrombosis of unspecified deep veins of unspecified distal lower extremity: Secondary | ICD-10-CM

## 2017-10-12 DIAGNOSIS — Z86718 Personal history of other venous thrombosis and embolism: Secondary | ICD-10-CM | POA: Insufficient documentation

## 2017-10-24 DIAGNOSIS — L308 Other specified dermatitis: Secondary | ICD-10-CM | POA: Diagnosis not present

## 2017-10-25 ENCOUNTER — Ambulatory Visit (INDEPENDENT_AMBULATORY_CARE_PROVIDER_SITE_OTHER): Payer: Medicare HMO | Admitting: *Deleted

## 2017-10-25 DIAGNOSIS — I441 Atrioventricular block, second degree: Secondary | ICD-10-CM

## 2017-10-25 DIAGNOSIS — I495 Sick sinus syndrome: Secondary | ICD-10-CM | POA: Diagnosis not present

## 2017-10-25 NOTE — Progress Notes (Signed)
Remote pacemaker transmission.   

## 2017-10-27 ENCOUNTER — Encounter: Payer: Self-pay | Admitting: Cardiology

## 2017-11-11 DIAGNOSIS — G4733 Obstructive sleep apnea (adult) (pediatric): Secondary | ICD-10-CM | POA: Diagnosis not present

## 2017-11-14 DIAGNOSIS — R600 Localized edema: Secondary | ICD-10-CM | POA: Diagnosis not present

## 2017-11-14 DIAGNOSIS — L309 Dermatitis, unspecified: Secondary | ICD-10-CM | POA: Diagnosis not present

## 2017-11-30 LAB — CUP PACEART REMOTE DEVICE CHECK
Battery Remaining Longevity: 174 mo
Brady Statistic AS VS Percent: 85.77 %
Brady Statistic RA Percent Paced: 13.88 %
Date Time Interrogation Session: 20190821110517
Implantable Lead Implant Date: 20190206
Implantable Lead Location: 753860
Lead Channel Pacing Threshold Pulse Width: 0.4 ms
Lead Channel Sensing Intrinsic Amplitude: 3 mV
Lead Channel Sensing Intrinsic Amplitude: 3 mV
Lead Channel Setting Pacing Pulse Width: 0.4 ms
MDC IDC LEAD IMPLANT DT: 20190206
MDC IDC LEAD LOCATION: 753859
MDC IDC MSMT BATTERY VOLTAGE: 3.16 V
MDC IDC MSMT LEADCHNL RA IMPEDANCE VALUE: 323 Ohm
MDC IDC MSMT LEADCHNL RA IMPEDANCE VALUE: 475 Ohm
MDC IDC MSMT LEADCHNL RA PACING THRESHOLD AMPLITUDE: 0.875 V
MDC IDC MSMT LEADCHNL RV IMPEDANCE VALUE: 513 Ohm
MDC IDC MSMT LEADCHNL RV IMPEDANCE VALUE: 570 Ohm
MDC IDC MSMT LEADCHNL RV PACING THRESHOLD AMPLITUDE: 0.5 V
MDC IDC MSMT LEADCHNL RV PACING THRESHOLD PULSEWIDTH: 0.4 ms
MDC IDC MSMT LEADCHNL RV SENSING INTR AMPL: 6.625 mV
MDC IDC MSMT LEADCHNL RV SENSING INTR AMPL: 6.625 mV
MDC IDC PG IMPLANT DT: 20190206
MDC IDC SET LEADCHNL RA PACING AMPLITUDE: 2 V
MDC IDC SET LEADCHNL RV PACING AMPLITUDE: 2.5 V
MDC IDC SET LEADCHNL RV SENSING SENSITIVITY: 0.9 mV
MDC IDC STAT BRADY AP VP PERCENT: 6.88 %
MDC IDC STAT BRADY AP VS PERCENT: 7 %
MDC IDC STAT BRADY AS VP PERCENT: 0.34 %
MDC IDC STAT BRADY RV PERCENT PACED: 7.23 %

## 2017-12-05 DIAGNOSIS — Z23 Encounter for immunization: Secondary | ICD-10-CM | POA: Diagnosis not present

## 2017-12-05 DIAGNOSIS — L600 Ingrowing nail: Secondary | ICD-10-CM | POA: Diagnosis not present

## 2017-12-11 DIAGNOSIS — G4733 Obstructive sleep apnea (adult) (pediatric): Secondary | ICD-10-CM | POA: Diagnosis not present

## 2017-12-12 DIAGNOSIS — M205X2 Other deformities of toe(s) (acquired), left foot: Secondary | ICD-10-CM | POA: Diagnosis not present

## 2017-12-12 DIAGNOSIS — M79671 Pain in right foot: Secondary | ICD-10-CM | POA: Diagnosis not present

## 2017-12-12 DIAGNOSIS — M205X1 Other deformities of toe(s) (acquired), right foot: Secondary | ICD-10-CM | POA: Diagnosis not present

## 2018-01-11 DIAGNOSIS — G4733 Obstructive sleep apnea (adult) (pediatric): Secondary | ICD-10-CM | POA: Diagnosis not present

## 2018-01-23 DIAGNOSIS — R7303 Prediabetes: Secondary | ICD-10-CM | POA: Diagnosis not present

## 2018-01-23 DIAGNOSIS — E782 Mixed hyperlipidemia: Secondary | ICD-10-CM | POA: Diagnosis not present

## 2018-01-23 DIAGNOSIS — M1000 Idiopathic gout, unspecified site: Secondary | ICD-10-CM | POA: Diagnosis not present

## 2018-01-24 ENCOUNTER — Ambulatory Visit (INDEPENDENT_AMBULATORY_CARE_PROVIDER_SITE_OTHER): Payer: Medicare HMO

## 2018-01-24 DIAGNOSIS — I495 Sick sinus syndrome: Secondary | ICD-10-CM | POA: Diagnosis not present

## 2018-01-24 DIAGNOSIS — R001 Bradycardia, unspecified: Secondary | ICD-10-CM

## 2018-01-25 NOTE — Progress Notes (Signed)
Remote pacemaker transmission.   

## 2018-01-30 DIAGNOSIS — M1000 Idiopathic gout, unspecified site: Secondary | ICD-10-CM | POA: Diagnosis not present

## 2018-01-30 DIAGNOSIS — I10 Essential (primary) hypertension: Secondary | ICD-10-CM | POA: Diagnosis not present

## 2018-01-30 DIAGNOSIS — E782 Mixed hyperlipidemia: Secondary | ICD-10-CM | POA: Diagnosis not present

## 2018-01-30 DIAGNOSIS — R7303 Prediabetes: Secondary | ICD-10-CM | POA: Diagnosis not present

## 2018-02-10 DIAGNOSIS — G4733 Obstructive sleep apnea (adult) (pediatric): Secondary | ICD-10-CM | POA: Diagnosis not present

## 2018-02-14 DIAGNOSIS — H01025 Squamous blepharitis left lower eyelid: Secondary | ICD-10-CM | POA: Diagnosis not present

## 2018-02-14 DIAGNOSIS — H25813 Combined forms of age-related cataract, bilateral: Secondary | ICD-10-CM | POA: Diagnosis not present

## 2018-02-14 DIAGNOSIS — H01022 Squamous blepharitis right lower eyelid: Secondary | ICD-10-CM | POA: Diagnosis not present

## 2018-02-14 DIAGNOSIS — H02831 Dermatochalasis of right upper eyelid: Secondary | ICD-10-CM | POA: Diagnosis not present

## 2018-02-14 DIAGNOSIS — H01024 Squamous blepharitis left upper eyelid: Secondary | ICD-10-CM | POA: Diagnosis not present

## 2018-02-14 DIAGNOSIS — H02834 Dermatochalasis of left upper eyelid: Secondary | ICD-10-CM | POA: Diagnosis not present

## 2018-02-14 DIAGNOSIS — H01021 Squamous blepharitis right upper eyelid: Secondary | ICD-10-CM | POA: Diagnosis not present

## 2018-02-14 DIAGNOSIS — H40033 Anatomical narrow angle, bilateral: Secondary | ICD-10-CM | POA: Diagnosis not present

## 2018-02-14 DIAGNOSIS — H02423 Myogenic ptosis of bilateral eyelids: Secondary | ICD-10-CM | POA: Diagnosis not present

## 2018-03-06 DIAGNOSIS — G4733 Obstructive sleep apnea (adult) (pediatric): Secondary | ICD-10-CM | POA: Diagnosis not present

## 2018-03-09 DIAGNOSIS — M205X1 Other deformities of toe(s) (acquired), right foot: Secondary | ICD-10-CM | POA: Diagnosis not present

## 2018-03-09 DIAGNOSIS — M79671 Pain in right foot: Secondary | ICD-10-CM | POA: Diagnosis not present

## 2018-03-13 DIAGNOSIS — H40031 Anatomical narrow angle, right eye: Secondary | ICD-10-CM | POA: Diagnosis not present

## 2018-03-21 LAB — CUP PACEART REMOTE DEVICE CHECK
Brady Statistic AP VP Percent: 9.31 %
Brady Statistic AS VP Percent: 0.73 %
Brady Statistic RA Percent Paced: 11.55 %
Date Time Interrogation Session: 20191120072051
Implantable Lead Implant Date: 20190206
Implantable Lead Location: 753859
Implantable Lead Model: 5076
Lead Channel Impedance Value: 608 Ohm
Lead Channel Pacing Threshold Amplitude: 0.5 V
Lead Channel Pacing Threshold Amplitude: 0.75 V
Lead Channel Pacing Threshold Pulse Width: 0.4 ms
Lead Channel Sensing Intrinsic Amplitude: 3.625 mV
Lead Channel Sensing Intrinsic Amplitude: 3.625 mV
Lead Channel Sensing Intrinsic Amplitude: 7.375 mV
Lead Channel Sensing Intrinsic Amplitude: 7.375 mV
Lead Channel Setting Pacing Amplitude: 2.5 V
MDC IDC LEAD IMPLANT DT: 20190206
MDC IDC LEAD LOCATION: 753860
MDC IDC MSMT BATTERY REMAINING LONGEVITY: 170 mo
MDC IDC MSMT BATTERY VOLTAGE: 3.12 V
MDC IDC MSMT LEADCHNL RA IMPEDANCE VALUE: 304 Ohm
MDC IDC MSMT LEADCHNL RA IMPEDANCE VALUE: 418 Ohm
MDC IDC MSMT LEADCHNL RA PACING THRESHOLD PULSEWIDTH: 0.4 ms
MDC IDC MSMT LEADCHNL RV IMPEDANCE VALUE: 532 Ohm
MDC IDC PG IMPLANT DT: 20190206
MDC IDC SET LEADCHNL RA PACING AMPLITUDE: 2 V
MDC IDC SET LEADCHNL RV PACING PULSEWIDTH: 0.4 ms
MDC IDC SET LEADCHNL RV SENSING SENSITIVITY: 0.9 mV
MDC IDC STAT BRADY AP VS PERCENT: 2.24 %
MDC IDC STAT BRADY AS VS PERCENT: 87.73 %
MDC IDC STAT BRADY RV PERCENT PACED: 10.04 %

## 2018-03-29 DIAGNOSIS — E782 Mixed hyperlipidemia: Secondary | ICD-10-CM | POA: Diagnosis not present

## 2018-03-29 DIAGNOSIS — I10 Essential (primary) hypertension: Secondary | ICD-10-CM | POA: Diagnosis not present

## 2018-03-29 DIAGNOSIS — M1000 Idiopathic gout, unspecified site: Secondary | ICD-10-CM | POA: Diagnosis not present

## 2018-03-29 DIAGNOSIS — R7303 Prediabetes: Secondary | ICD-10-CM | POA: Diagnosis not present

## 2018-03-29 DIAGNOSIS — Z0000 Encounter for general adult medical examination without abnormal findings: Secondary | ICD-10-CM | POA: Diagnosis not present

## 2018-04-03 DIAGNOSIS — H40033 Anatomical narrow angle, bilateral: Secondary | ICD-10-CM | POA: Diagnosis not present

## 2018-04-04 LAB — CUP PACEART INCLINIC DEVICE CHECK
Date Time Interrogation Session: 20200129145220
Implantable Lead Implant Date: 20190206
Implantable Lead Location: 753860
Implantable Lead Model: 5076
Implantable Lead Model: 5076
MDC IDC LEAD IMPLANT DT: 20190206
MDC IDC LEAD LOCATION: 753859
MDC IDC PG IMPLANT DT: 20190206

## 2018-04-05 DIAGNOSIS — R600 Localized edema: Secondary | ICD-10-CM | POA: Diagnosis not present

## 2018-04-05 DIAGNOSIS — I83893 Varicose veins of bilateral lower extremities with other complications: Secondary | ICD-10-CM | POA: Diagnosis not present

## 2018-04-05 DIAGNOSIS — I87022 Postthrombotic syndrome with inflammation of left lower extremity: Secondary | ICD-10-CM | POA: Diagnosis not present

## 2018-04-12 DIAGNOSIS — E782 Mixed hyperlipidemia: Secondary | ICD-10-CM | POA: Diagnosis not present

## 2018-04-12 DIAGNOSIS — I10 Essential (primary) hypertension: Secondary | ICD-10-CM | POA: Diagnosis not present

## 2018-04-12 DIAGNOSIS — Z0000 Encounter for general adult medical examination without abnormal findings: Secondary | ICD-10-CM | POA: Diagnosis not present

## 2018-04-12 DIAGNOSIS — M1000 Idiopathic gout, unspecified site: Secondary | ICD-10-CM | POA: Diagnosis not present

## 2018-04-12 DIAGNOSIS — I82412 Acute embolism and thrombosis of left femoral vein: Secondary | ICD-10-CM | POA: Diagnosis not present

## 2018-04-25 ENCOUNTER — Ambulatory Visit (INDEPENDENT_AMBULATORY_CARE_PROVIDER_SITE_OTHER): Payer: Self-pay

## 2018-04-25 DIAGNOSIS — I495 Sick sinus syndrome: Secondary | ICD-10-CM

## 2018-04-25 DIAGNOSIS — I441 Atrioventricular block, second degree: Secondary | ICD-10-CM

## 2018-04-26 ENCOUNTER — Telehealth: Payer: Self-pay

## 2018-04-26 NOTE — Telephone Encounter (Signed)
Left message for patient to remind of missed remote transmission.  

## 2018-04-27 LAB — CUP PACEART REMOTE DEVICE CHECK
Brady Statistic AP VS Percent: 0.9 %
Brady Statistic AS VP Percent: 0.7 %
Brady Statistic RA Percent Paced: 7.2 %
Implantable Lead Location: 753859
Implantable Lead Location: 753860
Implantable Lead Model: 5076
Lead Channel Impedance Value: 323 Ohm
Lead Channel Impedance Value: 380 Ohm
Lead Channel Pacing Threshold Amplitude: 0.5 V
Lead Channel Pacing Threshold Pulse Width: 0.4 ms
Lead Channel Sensing Intrinsic Amplitude: 3.5 mV
Lead Channel Sensing Intrinsic Amplitude: 7 mV
Lead Channel Sensing Intrinsic Amplitude: 7 mV
Lead Channel Setting Pacing Amplitude: 2.5 V
Lead Channel Setting Pacing Pulse Width: 0.4 ms
MDC IDC LEAD IMPLANT DT: 20190206
MDC IDC LEAD IMPLANT DT: 20190206
MDC IDC MSMT BATTERY REMAINING LONGEVITY: 167 mo
MDC IDC MSMT BATTERY VOLTAGE: 3.09 V
MDC IDC MSMT LEADCHNL RA PACING THRESHOLD AMPLITUDE: 0.625 V
MDC IDC MSMT LEADCHNL RA PACING THRESHOLD PULSEWIDTH: 0.4 ms
MDC IDC MSMT LEADCHNL RA SENSING INTR AMPL: 3.5 mV
MDC IDC MSMT LEADCHNL RV IMPEDANCE VALUE: 494 Ohm
MDC IDC MSMT LEADCHNL RV IMPEDANCE VALUE: 551 Ohm
MDC IDC PG IMPLANT DT: 20190206
MDC IDC SESS DTM: 20200219110925
MDC IDC SET LEADCHNL RA PACING AMPLITUDE: 2 V
MDC IDC SET LEADCHNL RV SENSING SENSITIVITY: 0.9 mV
MDC IDC STAT BRADY AP VP PERCENT: 6.3 %
MDC IDC STAT BRADY AS VS PERCENT: 92.1 %
MDC IDC STAT BRADY RV PERCENT PACED: 7 %

## 2018-05-03 NOTE — Progress Notes (Signed)
Remote pacemaker transmission.   

## 2018-05-04 ENCOUNTER — Encounter: Payer: Self-pay | Admitting: Cardiology

## 2018-07-03 ENCOUNTER — Emergency Department (HOSPITAL_COMMUNITY)
Admission: EM | Admit: 2018-07-03 | Discharge: 2018-07-03 | Disposition: A | Discharge status: Home / Self Care | Payer: Medicare Other | Attending: Emergency Medicine | Admitting: Emergency Medicine

## 2018-07-03 ENCOUNTER — Encounter (HOSPITAL_COMMUNITY): Payer: Self-pay | Admitting: Family Medicine

## 2018-07-03 ENCOUNTER — Other Ambulatory Visit: Payer: Self-pay

## 2018-07-03 ENCOUNTER — Emergency Department (HOSPITAL_COMMUNITY): Payer: Medicare Other

## 2018-07-03 DIAGNOSIS — N23 Unspecified renal colic: Secondary | ICD-10-CM | POA: Insufficient documentation

## 2018-07-03 DIAGNOSIS — I10 Essential (primary) hypertension: Secondary | ICD-10-CM | POA: Insufficient documentation

## 2018-07-03 DIAGNOSIS — Z950 Presence of cardiac pacemaker: Secondary | ICD-10-CM | POA: Insufficient documentation

## 2018-07-03 DIAGNOSIS — N132 Hydronephrosis with renal and ureteral calculous obstruction: Secondary | ICD-10-CM | POA: Diagnosis not present

## 2018-07-03 DIAGNOSIS — Z79899 Other long term (current) drug therapy: Secondary | ICD-10-CM | POA: Insufficient documentation

## 2018-07-03 DIAGNOSIS — R109 Unspecified abdominal pain: Secondary | ICD-10-CM | POA: Diagnosis present

## 2018-07-03 DIAGNOSIS — K760 Fatty (change of) liver, not elsewhere classified: Secondary | ICD-10-CM | POA: Diagnosis not present

## 2018-07-03 LAB — URINALYSIS, ROUTINE W REFLEX MICROSCOPIC
Bilirubin Urine: NEGATIVE
Glucose, UA: NEGATIVE mg/dL
Ketones, ur: NEGATIVE mg/dL
Leukocytes,Ua: NEGATIVE
Nitrite: NEGATIVE
Protein, ur: 100 mg/dL — AB
RBC / HPF: >50 RBC/hpf — ABNORMAL HIGH (ref 0–5)
Specific Gravity, Urine: 1.02 (ref 1.005–1.030)
pH: 5 (ref 5.0–8.0)

## 2018-07-03 LAB — BASIC METABOLIC PANEL
Anion gap: 9 (ref 5–15)
BUN: 20 mg/dL (ref 8–23)
CO2: 22 mmol/L (ref 22–32)
Calcium: 9 mg/dL (ref 8.9–10.3)
Chloride: 106 mmol/L (ref 98–111)
Creatinine, Ser: 1.35 mg/dL — ABNORMAL HIGH (ref 0.61–1.24)
GFR calc Af Amer: 58 mL/min — ABNORMAL LOW (ref >60)
GFR calc non Af Amer: 50 mL/min — ABNORMAL LOW (ref >60)
Glucose, Bld: 122 mg/dL — ABNORMAL HIGH (ref 70–99)
Potassium: 4.3 mmol/L (ref 3.5–5.1)
Sodium: 137 mmol/L (ref 135–145)

## 2018-07-03 LAB — CBC WITH DIFFERENTIAL/PLATELET
Abs Immature Granulocytes: 0.04 10*3/uL (ref 0.00–0.07)
Basophils Absolute: 0 10*3/uL (ref 0.0–0.1)
Basophils Relative: 1 %
Eosinophils Absolute: 0.1 10*3/uL (ref 0.0–0.5)
Eosinophils Relative: 1 %
HCT: 43.1 % (ref 39.0–52.0)
Hemoglobin: 14 g/dL (ref 13.0–17.0)
Immature Granulocytes: 1 %
Lymphocytes Relative: 26 %
Lymphs Abs: 1.9 10*3/uL (ref 0.7–4.0)
MCH: 29.9 pg (ref 26.0–34.0)
MCHC: 32.5 g/dL (ref 30.0–36.0)
MCV: 91.9 fL (ref 80.0–100.0)
Monocytes Absolute: 0.5 10*3/uL (ref 0.1–1.0)
Monocytes Relative: 7 %
Neutro Abs: 4.6 10*3/uL (ref 1.7–7.7)
Neutrophils Relative %: 64 %
Platelets: 202 10*3/uL (ref 150–400)
RBC: 4.69 MIL/uL (ref 4.22–5.81)
RDW: 14 % (ref 11.5–15.5)
WBC: 7.2 10*3/uL (ref 4.0–10.5)
nRBC: 0 % (ref 0.0–0.2)

## 2018-07-03 MED ORDER — HYDROCODONE-ACETAMINOPHEN 5-325 MG PO TABS: 1.0000 | ORAL_TABLET | Freq: Four times a day (QID) | ORAL | 0 refills | Status: DC | PRN

## 2018-07-03 MED ORDER — TAMSULOSIN HCL 0.4 MG PO CAPS
0.4000 mg | ORAL_CAPSULE | Freq: Once | ORAL | Status: AC
  Administered 2018-07-03: 0.4 mg via ORAL
  Filled 2018-07-03: qty 1

## 2018-07-03 MED ORDER — TAMSULOSIN HCL 0.4 MG PO CAPS: 0.4000 mg | ORAL_CAPSULE | Freq: Every day | ORAL | 0 refills | Status: DC

## 2018-07-03 MED ORDER — IBUPROFEN 200 MG PO TABS
400.0000 mg | ORAL_TABLET | Freq: Once | ORAL | Status: AC
  Administered 2018-07-03: 400 mg via ORAL
  Filled 2018-07-03: qty 2

## 2018-07-03 MED ORDER — HYDROCODONE-ACETAMINOPHEN 5-325 MG PO TABS
1.0000 | ORAL_TABLET | Freq: Once | ORAL | Status: AC
  Administered 2018-07-03: 1 via ORAL
  Filled 2018-07-03: qty 1

## 2018-07-03 NOTE — Discharge Instructions (Signed)
If you have fever above 100, worsening pain or vomiting please return to the ER.  If you have return of your pain but no other symptoms, please follow-up with urology

## 2018-07-03 NOTE — ED Notes (Signed)
Called daughter Festus Holts at [phone] and updated her regarding father's treatment.

## 2018-07-03 NOTE — ED Triage Notes (Signed)
Patient is complaining of left flank pain/lower back pain that started about 23:30 last night. Pain started all of a sudden while attempting to get up from a sitting position. Patient states it felt like he needed to have a bowel movement but could not. Patient took TYLENOL x 2 tabs PTA with a little relief. Denies any urinary symptoms. Patient is ambulatory.

## 2018-07-03 NOTE — ED Provider Notes (Signed)
[facility] DEPT Provider Note   CSN: [id number] Arrival date & time: 07/03/18  0222    History   Chief Complaint Chief Complaint  Patient presents with  . Back Pain    HPI [name] is a 78 y.o. male.     The history is provided by the patient.  Flank Pain  This is a new problem. The current episode started 3 to 5 hours ago. The problem occurs constantly. The problem has been gradually improving. Associated symptoms include abdominal pain. Pertinent negatives include no chest pain and no shortness of breath. Nothing aggravates the symptoms. The symptoms are relieved by acetaminophen. He has tried acetaminophen for the symptoms. The treatment provided moderate relief.  Patient with history of hyperlipidemia, obesity, pacemaker, on anticoagulation presents with left leg pain.  He reports this began approximately 3 hours ago.  It began at rest.  It does wrap around to his upper abdomen.  No fever/vomiting.  Denies chest pain or shortness of breath.  No urinary symptoms.  No lower abdominal pain. He denies any trauma, but does work in his yard  Past Medical History:  Diagnosis Date  . Arthritis    "probably; hands" (04/12/2017)  . High cholesterol   . History of gout   . Hypertension   . OSA (obstructive sleep apnea)    per pt noncompliant CPAP has not use it since 2015  . Presence of permanent cardiac pacemaker 04/12/2017   Dual Chamber  . Wears dentures    upper    Patient Active Problem List   Diagnosis Date Noted  . Acute biliary pancreatitis 08/17/2017  . Pacemaker 04/12/2017  . Syncope and collapse 01/23/2017  . Second degree atrioventricular block, Mobitz (type) I 01/23/2017  . Symptomatic bradycardia 01/23/2017  . SVT (supraventricular tachycardia) ([place]) 01/23/2017  . Other fatigue 01/23/2017  . Chest pain 12/28/2016  . Essential hypertension 12/28/2016  . BRBPR (bright red blood per rectum) 11/04/2010  . Mild renal  insufficiency 11/04/2010  . Sleep apnea 11/04/2010    Past Surgical History:  Procedure Laterality Date  . CHOLECYSTECTOMY N/A 08/19/2017   Procedure: LAPAROSCOPIC ASSISTED OPEN CHOLECYSTECTOMY WITH INTRAOPERATIVE CHOLANGIOGRAM;  Surgeon: Armandina Gemma, MD;  Location: [facility];  Service: General;  Laterality: N/A;  . ELBOW BURSA SURGERY Left 07-21-2009   extensive bursectomy/ tenosynovectomy/ ulnar nerve release  . EUS N/A 08/18/2017   Procedure: ESOPHAGEAL ENDOSCOPIC ULTRASOUND (EUS) RADIAL;  Surgeon: Milus Banister, MD;  Location: [facility];  Service: Endoscopy;  Laterality: N/A;  . HAMMER TOE SURGERY Right 2015  . HAMMER TOE SURGERY Right 12/11/2014   Procedure: 4th HAMMER TOE CORRECTION, EXCISION LESION 3rd TOE RIGHT FOOT;  Surgeon: Rosemary Holms, DPM;  Location: [facility];  Service: Podiatry;  Laterality: Right;  . INSERT / REPLACE / REMOVE PACEMAKER  04/12/2017   Dual Chamber  . MICROLARYNGOSCOPY  06-07-2006   w/  Excision bilateral vocal cord lesions (bilateral benign nodules)  . PACEMAKER IMPLANT N/A 04/12/2017   Procedure: PACEMAKER IMPLANT - Dual Chamber;  Surgeon: Sanda Klein, MD;  Location: [facility];  Service: Cardiovascular;  Laterality: N/A;        Home Medications    Prior to Admission medications   Medication Sig Start Date End Date Taking? Authorizing Provider  acetaminophen (TYLENOL) 325 MG tablet Take 2 tablets (650 mg total) by mouth every 6 (six) hours as needed for mild pain, moderate pain, fever or headache (or Fever >/= 101). 08/22/17  Earnstine Regal, PA-C  atorvastatin (LIPITOR) 20 MG tablet Take 20 mg by mouth every morning.    [provider]  ELIQUIS 5 MG TABS tablet 1 TABLET TWICE A DAY 30 DAYS 07/04/17   [provider]  losartan (COZAAR) 100 MG tablet Take 100 mg by mouth daily.    [provider]  Multiple Vitamins-Minerals (MULTIVITAMINS THER. W/MINERALS) TABS Take 1 tablet by mouth daily.       [provider]  nitroGLYCERIN (NITROSTAT) 0.4 MG SL tablet Place 1 tablet (0.4 mg total) under the tongue every 5 (five) minutes as needed for chest pain. 12/29/16   [name], MD  potassium chloride SA (K-DUR,KLOR-CON) 20 MEQ tablet Take 20 mEq by mouth every morning.     [provider]  traMADol (ULTRAM) 50 MG tablet Take 1 tablet (50 mg total) by mouth every 6 (six) hours as needed (mild pain not relieved by tylenol). 08/22/17   Earnstine Regal, PA-C    Family History Family History  Problem Relation Age of Onset  . Stroke Mother 38       had gangrene of toe, then later "stroke" per patient  . Osteoarthritis Father   . Heart attack Father 59  . Heart attack Sister        sudden cardiac arrest  . Colon cancer Neg Hx     Social History Social History   Tobacco Use  . Smoking status: Never Smoker  . Smokeless tobacco: Never Used  Substance Use Topics  . Alcohol use: Yes    Alcohol/week: 2.0 standard drinks    Types: 2 Standard drinks or equivalent per week  . Drug use: No     Allergies   Patient has no known allergies.   Review of Systems Review of Systems  Constitutional: Negative for fever.  Respiratory: Negative for shortness of breath.   Cardiovascular: Negative for chest pain.  Gastrointestinal: Positive for abdominal pain.  Genitourinary: Positive for flank pain. Negative for difficulty urinating.  Neurological: Negative for weakness.  All other systems reviewed and are negative.    Physical Exam Updated Vital Signs BP (!) 195/90 (BP Location: Left Arm)   Pulse 73   Temp 98.7 F (37.1 C) (Oral)   Resp 18   SpO2 97%   Physical Exam CONSTITUTIONAL: Well developed/well nourished HEAD: Normocephalic/atraumatic EYES: EOMI/PERRL ENMT: Mucous membranes moist NECK: supple no meningeal signs SPINE/BACK:entire spine nontender CV: S1/S2 noted, no murmurs/rubs/gallops noted LUNGS: Lungs are clear to auscultation bilaterally, no  apparent distress ABDOMEN: soft, mild LUQ tenderness, no rebound or guarding, bowel sounds noted throughout abdomen, obese GU: Left Cva tenderness NEURO: Pt is awake/alert/appropriate, moves all extremitiesx4.  No facial droop.   EXTREMITIES: pulses normal/equal, full ROM SKIN: warm, color normal PSYCH: no abnormalities of mood noted, alert and oriented to situation   ED Treatments / Results  Labs (all labs ordered are listed, but only abnormal results are displayed) Labs Reviewed  BASIC METABOLIC PANEL - Abnormal; Notable for the following components:      Result Value   Glucose, Bld 122 (*)    Creatinine, Ser 1.35 (*)    GFR calc non Af Amer 50 (*)    GFR calc Af Amer 58 (*)    All other components within normal limits  URINALYSIS, ROUTINE W REFLEX MICROSCOPIC - Abnormal; Notable for the following components:   APPearance HAZY (*)    Hgb urine dipstick LARGE (*)    Protein, ur 100 (*)    RBC /  HPF >50 (*)    Bacteria, UA RARE (*)    All other components within normal limits  CBC WITH DIFFERENTIAL/PLATELET    EKG None  Radiology Ct Renal Stone Study  Result Date: 07/03/2018 CLINICAL DATA:  Left flank pain.  Lower abdominal pain. EXAM: CT ABDOMEN AND PELVIS WITHOUT CONTRAST TECHNIQUE: Multidetector CT imaging of the abdomen and pelvis was performed following the standard protocol without IV contrast. COMPARISON:  CT of the abdomen and pelvis 02/15/2015 FINDINGS: Lower chest: Lung bases are clear without focal nodule, mass, or airspace disease. Pacing wires are present in the right atrium and right ventricle. Mitral annular calcifications are present. Heart size is normal. No significant pleural or pericardial effusion is present. Hepatobiliary: There is diffuse fatty infiltration of the liver. No discrete lesions are present. The common bile duct is within normal limits following cholecystectomy. Pancreas: Unremarkable. No pancreatic ductal dilatation or surrounding inflammatory  changes. Spleen: Normal in size without focal abnormality. Adrenals/Urinary Tract: Adrenal glands are normal bilaterally. A 14 mm nonobstructing stone is present at the lower pole of the right kidney. There is mild left-sided hydronephrosis. There is stranding about the kidney and ureter. Left ureter is dilated to the level of a 6 mm obstructing stone at L4. More distal ureter is within normal limits. The urinary bladder is collapsed. The right ureter is unremarkable. Stomach/Bowel: The stomach and duodenum are within normal limits. Small bowel is unremarkable. Terminal ileum is within normal limits. The appendix is visualized and normal. Ascending and transverse colon are within normal limits. Diverticular changes are present in the descending and sigmoid colon without focal inflammation to suggest diverticulitis. Vascular/Lymphatic: Atherosclerotic calcifications are present in the aorta and branch vessels without aneurysm. No significant retroperitoneal adenopathy is present. Reproductive: Prostate is unremarkable. Other: No abdominal wall hernia or abnormality. No abdominopelvic ascites. Musculoskeletal: Fused anterior osteophytes are present in the lower thoracic spine. Prominent Schmorl's node is evident at the inferior endplate of L3. Vertebral body heights are maintained. Advanced facet hypertrophy is noted in the lower lumbar spine. No focal lytic or blastic lesions are present otherwise. SI joints are fused bilaterally. Mild degenerative changes are noted at the hips bilaterally. IMPRESSION: 1. Obstructing 6 mm stone in the left ureter at the level of L4. 2. Mild left-sided hydronephrosis. 3. No additional left-sided stones. 4. Nonobstructing 14 mm stone at the lower pole of the right kidney. 5.  Aortic Atherosclerosis (ICD10-I70.0). 6. Hepatic steatosis 7. Multilevel degenerative changes of the thoracolumbar spine as described. Electronically Signed   By: San Morelle M.D.   On: 07/03/2018 04:20     Procedures Procedures Medications Ordered in ED Medications  ibuprofen (ADVIL) tablet 400 mg (400 mg Oral Given 07/03/18 0248)  HYDROcodone-acetaminophen (NORCO/VICODIN) 5-325 MG per tablet 1 tablet (1 tablet Oral Given 07/03/18 0445)  tamsulosin (FLOMAX) capsule 0.4 mg (0.4 mg Oral Given 07/03/18 0445)     Initial Impression / Assessment and Plan / ED Course  I have reviewed the triage vital signs and the nursing notes.  Pertinent labs/imaging results that were available during my care of the patient were reviewed by me and considered in my medical decision making (see chart for details).        3:04 AM Patient presents with sudden onset of left flank pain.  He took Tylenol with some improvement.  Previous CT imaging has revealed nephrolithiasis Plan to obtain labs and urinalysis.  Patient requesting medications by mouth only.  He is already taken 2 Tylenol, will  give him 1 dose of ibuprofen 3:47 AM PT with continued pain, now radiating to his abdomen, will proceed with CT imaging. He declines pain medicines at this time 4:52 AM I discussed CT findings with patient.  Patient found to have 6 mm ureteral stone with mild hydronephrosis.  Patient is quite stoic, reports his pain is only a 5 out of 10.  He declines IV pain medications. He does accept oral medications.  We will start him on Flomax. Anticipate discharge, he will be referred to urology  5:29 AM Discussed with patient and his daughter via phone.  Plan will be to follow-up with urology if pain persists.  If there is fever, vomiting, worsening pain he will return to the  emergency room.  Patient and family agree with plan.  Patient requesting discharge. Final Clinical Impressions(s) / ED Diagnoses   Final diagnoses:  Ureteral colic    ED Discharge Orders         Ordered    tamsulosin (FLOMAX) 0.4 MG CAPS capsule  Daily after supper     07/03/18 0432    HYDROcodone-acetaminophen (NORCO/VICODIN) 5-325 MG tablet  Every  6 hours PRN     07/03/18 0452           Ripley Fraise, MD 07/03/18 0530

## 2018-07-03 NOTE — ED Notes (Signed)
Patient transported to CT 

## 2018-07-06 DIAGNOSIS — R3121 Asymptomatic microscopic hematuria: Secondary | ICD-10-CM | POA: Diagnosis not present

## 2018-07-06 DIAGNOSIS — N201 Calculus of ureter: Secondary | ICD-10-CM | POA: Diagnosis not present

## 2018-07-25 ENCOUNTER — Ambulatory Visit (INDEPENDENT_AMBULATORY_CARE_PROVIDER_SITE_OTHER): Payer: Medicare Other | Admitting: *Deleted

## 2018-07-25 DIAGNOSIS — I495 Sick sinus syndrome: Secondary | ICD-10-CM

## 2018-07-25 DIAGNOSIS — I441 Atrioventricular block, second degree: Secondary | ICD-10-CM | POA: Diagnosis not present

## 2018-07-25 LAB — CUP PACEART REMOTE DEVICE CHECK
Battery Remaining Longevity: 163 mo
Battery Voltage: 3.06 V
Brady Statistic AP VP Percent: 11.56 %
Brady Statistic AP VS Percent: 1.72 %
Brady Statistic AS VP Percent: 2.16 %
Brady Statistic AS VS Percent: 84.55 %
Brady Statistic RA Percent Paced: 13.29 %
Brady Statistic RV Percent Paced: 13.73 %
Date Time Interrogation Session: 20200520062037
Implantable Lead Implant Date: 20190206
Implantable Lead Implant Date: 20190206
Implantable Lead Location: 753859
Implantable Lead Location: 753860
Implantable Lead Model: 5076
Implantable Lead Model: 5076
Implantable Pulse Generator Implant Date: 20190206
Lead Channel Impedance Value: 323 Ohm
Lead Channel Impedance Value: 361 Ohm
Lead Channel Impedance Value: 494 Ohm
Lead Channel Impedance Value: 551 Ohm
Lead Channel Pacing Threshold Amplitude: 0.5 V
Lead Channel Pacing Threshold Amplitude: 0.625 V
Lead Channel Pacing Threshold Pulse Width: 0.4 ms
Lead Channel Pacing Threshold Pulse Width: 0.4 ms
Lead Channel Sensing Intrinsic Amplitude: 3.375 mV
Lead Channel Sensing Intrinsic Amplitude: 3.375 mV
Lead Channel Sensing Intrinsic Amplitude: 6.875 mV
Lead Channel Sensing Intrinsic Amplitude: 6.875 mV
Lead Channel Setting Pacing Amplitude: 2 V
Lead Channel Setting Pacing Amplitude: 2.5 V
Lead Channel Setting Pacing Pulse Width: 0.4 ms
Lead Channel Setting Sensing Sensitivity: 0.9 mV

## 2018-07-27 ENCOUNTER — Telehealth: Payer: Self-pay | Admitting: Cardiovascular Disease

## 2018-07-27 NOTE — Telephone Encounter (Signed)
smartphone/ my chart via emailed/ consent/ pre reg completed

## 2018-08-01 ENCOUNTER — Telehealth (INDEPENDENT_AMBULATORY_CARE_PROVIDER_SITE_OTHER): Payer: Medicare Other | Admitting: Cardiovascular Disease

## 2018-08-01 ENCOUNTER — Encounter: Payer: Self-pay | Admitting: Cardiovascular Disease

## 2018-08-01 VITALS — BP 186/83 | HR 83 | Ht 71.0 in | Wt 225.0 lb

## 2018-08-01 DIAGNOSIS — E7800 Pure hypercholesterolemia, unspecified: Secondary | ICD-10-CM

## 2018-08-01 DIAGNOSIS — Z950 Presence of cardiac pacemaker: Secondary | ICD-10-CM

## 2018-08-01 DIAGNOSIS — G4733 Obstructive sleep apnea (adult) (pediatric): Secondary | ICD-10-CM | POA: Diagnosis not present

## 2018-08-01 DIAGNOSIS — L84 Corns and callosities: Secondary | ICD-10-CM | POA: Diagnosis not present

## 2018-08-01 DIAGNOSIS — I495 Sick sinus syndrome: Secondary | ICD-10-CM

## 2018-08-01 DIAGNOSIS — I471 Supraventricular tachycardia: Secondary | ICD-10-CM

## 2018-08-01 DIAGNOSIS — M79671 Pain in right foot: Secondary | ICD-10-CM | POA: Diagnosis not present

## 2018-08-01 DIAGNOSIS — I441 Atrioventricular block, second degree: Secondary | ICD-10-CM

## 2018-08-01 DIAGNOSIS — I10 Essential (primary) hypertension: Secondary | ICD-10-CM

## 2018-08-01 DIAGNOSIS — M205X1 Other deformities of toe(s) (acquired), right foot: Secondary | ICD-10-CM | POA: Diagnosis not present

## 2018-08-01 MED ORDER — NITROGLYCERIN 0.4 MG SL SUBL: 0.4000 mg | SUBLINGUAL_TABLET | SUBLINGUAL | 11 refills | Status: DC | PRN

## 2018-08-01 NOTE — Patient Instructions (Signed)
Medication Instructions:  Stop Potassium supplement  Continue all other medication  If you need a refill on your cardiac medications before your next appointment, please call your pharmacy.   Lab work: None ordered   Testing/Procedures: None ordered  Follow-Up: At [facility], you and your health needs are our priority.  As part of our continuing mission to provide you with exceptional heart care, we have created designated Provider Care Teams.  These Care Teams include your primary Cardiologist (physician) and Advanced Practice Providers (APPs -  Physician Assistants and Nurse Practitioners) who all work together to provide you with the care you need, when you need it. . Take Blood Pressure Daily for 7 days send Blood Pressure Log to office for Dr.Croitoru to review  . Schedule follow up appointment in 12 months   Call 3 months before to schedule

## 2018-08-01 NOTE — Progress Notes (Signed)
Virtual Visit via Video Note   This visit type was conducted due to national recommendations for restrictions regarding the COVID-19 Pandemic (e.g. social distancing) in an effort to limit this patient's exposure and mitigate transmission in our community.  Due to his co-morbid illnesses, this patient is at least at moderate risk for complications without adequate follow up.  This format is felt to be most appropriate for this patient at this time.  All issues noted in this document were discussed and addressed.  A limited physical exam was performed with this format.  Please refer to the patient's chart for his consent to telehealth for [facility].   Date:  08/01/2018   ID:  [name], [name] 1940/03/09, MRN [id number]  Patient Location: Home Provider Location: Home  PCP:  Merrilee Seashore, MD  Cardiologist:  Sanda Klein, MD  Electrophysiologist:  None   Evaluation Performed:  Follow-Up Visit  Chief Complaint: Pacemaker follow-up  History of Present Illness:    [name] is a 78 y.o. male with sick sinus syndrome and tachycardia-bradycardia syndrome, paroxysmal atrial tachycardia (possible AVNRT), second-degree atrioventricular block with bradycardia and near syncope for which he received a pacemaker in February 2019.  Additional problems include mild obesity, hyperlipidemia, essential hypertension, obstructive sleep apnea, gout  He has very rare swelling that occurs intermittently in the left leg where he had his DVT.  A repeat ultrasound in August 2019 showed virtually complete resolution of the thrombus, with exception of small organized thrombus in 1 of the popliteal veins.    He continues to be very physically active and has no cardiovascular complaints.  He mows several yards.  Activity level recorded by his pacemaker is around 8 hours a day and has not changed over the last 12 months.  His blood pressure was high today, but he checked it immediately after  running into the house after mowing his yard.  The patient specifically denies any chest pain at rest or exertion, dyspnea at rest or with exertion, orthopnea, paroxysmal nocturnal dyspnea, syncope, palpitations, focal neurological deficits, intermittent claudication, lower extremity edema, unexplained weight gain, cough, hemoptysis or wheezing.  Pacemaker interrogation shows normal device function, but very frequent episodes of tachycardia.  About 170 episodes have been detected since his last device check, most of these are extremely brief. They appear to have synchronous atrial and ventricular complexes, consistent with AV node reentry or junctional tachycardia.  Interestingly, the presentation EGM at his last device check shows atrial paced -ventricular paced rhythm with native P waves (retrograde?) falling in the refractory period.  He only has about 14% atrial pacing and 14% ventricular pacing.  Because the episodes of arrhythmia are so brief, they represent well under 1% of beats.  The patient does not have symptoms concerning for COVID-19 infection (fever, chills, cough, or new shortness of breath).    Past Medical History:  Diagnosis Date  . Arthritis    "probably; hands" (04/12/2017)  . High cholesterol   . History of gout   . Hypertension   . OSA (obstructive sleep apnea)    per pt noncompliant CPAP has not use it since 2015  . Presence of permanent cardiac pacemaker 04/12/2017   Dual Chamber  . Wears dentures    upper   Past Surgical History:  Procedure Laterality Date  . CHOLECYSTECTOMY N/A 08/19/2017   Procedure: LAPAROSCOPIC ASSISTED OPEN CHOLECYSTECTOMY WITH INTRAOPERATIVE CHOLANGIOGRAM;  Surgeon: Armandina Gemma, MD;  Location: [facility];  Service: General;  Laterality: N/A;  .  ELBOW BURSA SURGERY Left 07-21-2009   extensive bursectomy/ tenosynovectomy/ ulnar nerve release  . EUS N/A 08/18/2017   Procedure: ESOPHAGEAL ENDOSCOPIC ULTRASOUND (EUS) RADIAL;  Surgeon: Milus Banister, MD;  Location: [facility];  Service: Endoscopy;  Laterality: N/A;  . HAMMER TOE SURGERY Right 2015  . HAMMER TOE SURGERY Right 12/11/2014   Procedure: 4th HAMMER TOE CORRECTION, EXCISION LESION 3rd TOE RIGHT FOOT;  Surgeon: Rosemary Holms, DPM;  Location: [place];  Service: Podiatry;  Laterality: Right;  . INSERT / REPLACE / REMOVE PACEMAKER  04/12/2017   Dual Chamber  . MICROLARYNGOSCOPY  06-07-2006   w/  Excision bilateral vocal cord lesions (bilateral benign nodules)  . PACEMAKER IMPLANT N/A 04/12/2017   Procedure: PACEMAKER IMPLANT - Dual Chamber;  Surgeon: Sanda Klein, MD;  Location: [facility];  Service: Cardiovascular;  Laterality: N/A;     Current Meds  Medication Sig  . aspirin EC 81 MG tablet Take 81 mg by mouth daily.  Marland Kitchen atorvastatin (LIPITOR) 20 MG tablet Take 20 mg by mouth daily.  Marland Kitchen losartan (COZAAR) 100 MG tablet Take 100 mg by mouth daily.  . Multiple Vitamins-Minerals (MULTIVITAMINS THER. W/MINERALS) TABS Take 1 tablet by mouth daily.    . potassium chloride SA (K-DUR,KLOR-CON) 20 MEQ tablet Take 20 mEq by mouth every morning.   . [DISCONTINUED] acetaminophen (TYLENOL) 325 MG tablet Take 2 tablets (650 mg total) by mouth every 6 (six) hours as needed for mild pain, moderate pain, fever or headache (or Fever >/= 101).  . [DISCONTINUED] HYDROcodone-acetaminophen (NORCO/VICODIN) 5-325 MG tablet Take 1 tablet by mouth every 6 (six) hours as needed for severe pain.  . [DISCONTINUED] nitroGLYCERIN (NITROSTAT) 0.4 MG SL tablet Place 1 tablet (0.4 mg total) under the tongue every 5 (five) minutes as needed for chest pain.  . [DISCONTINUED] tamsulosin (FLOMAX) 0.4 MG CAPS capsule Take 1 capsule (0.4 mg total) by mouth daily after supper.     Allergies:   Patient has no known allergies.   Social History   Tobacco Use  . Smoking status: Never Smoker  . Smokeless tobacco: Never Used  Substance Use Topics  . Alcohol use: Yes    Alcohol/week: 2.0  standard drinks    Types: 2 Standard drinks or equivalent per week  . Drug use: No     Family Hx: The patient's family history includes Heart attack in his sister; Heart attack (age of onset: 75) in his father; Osteoarthritis in his father; Stroke (age of onset: 44) in his mother. There is no history of Colon cancer.  ROS:   Please see the history of present illness.     All other systems reviewed and are negative.   Prior CV studies:   The following studies were reviewed today:  Comprehensive pacemaker check Jul 25, 2018  Labs/Other Tests and Data Reviewed:    EKG:  An ECG dated 04/13/2017 was personally reviewed today and demonstrated:  Sinus rhythm right bundle branch block  Recent Labs: 08/20/2017: ALT 155; Magnesium 2.0 07/03/2018: BUN 20; Creatinine, Ser 1.35; Hemoglobin 14.0; Platelets 202; Potassium 4.3; Sodium 137  TSH 2.03  Recent Lipid Panel total cholesterol 150, HDL 63, LDL 73, triglycerides 69  Wt Readings from Last 3 Encounters:  08/01/18 225 lb (102.1 kg)  08/18/17 242 lb (109.8 kg)  07/26/17 235 lb (106.6 kg)     Objective:    Vital Signs:  BP (!) 186/83   Pulse 83   Ht 5\' 11"  (1.803 m)  Wt 225 lb (102.1 kg)   BMI 31.38 kg/m    VITAL SIGNS:  reviewed GEN:  no acute distress EYES:  sclerae anicteric, EOMI - Extraocular Movements Intact RESPIRATORY:  normal respiratory effort, symmetric expansion CARDIOVASCULAR:  no peripheral edema SKIN:  no rash, lesions or ulcers. MUSCULOSKELETAL:  no obvious deformities. NEURO:  alert and oriented x 3, no obvious focal deficit PSYCH:  normal affect Mildly obese  ASSESSMENT & PLAN:    1. PAT: I think this most likely represents junctional tachycardia, although I cannot exclude AVNRT.  The arrhythmia is asymptomatic and the episodes are consistently very brief.  No specific therapy is recommended.  AV node blocking agents would lead to increased frequency of ventricular pacing, which might be more deleterious  than the arrhythmia itself. 2. 2nd deg AVB, MT1: He has been asymptomatic since pacemaker implantation.  No recent episodes of near syncope. 3. PM: Normal device function, continue remote downloads every 3 months and yearly office visits. 4. HTN: His blood pressure is quite high today, but he believes.  Asked to check his blood pressure daily at home and send a log to the office.  He is no longer taking a diuretic, but is taking losartan so I told him to stop taking the potassium supplement. 5. OSA: Currently consistent use of CPAP and weight loss. 6. HLP: Labs checked most recently by Dr. Ashby Dawes showing total cholesterol 150, HDL 63, LDL 73, triglycerides 69 all of which are in desirable range.  COVID-19 Education: The signs and symptoms of COVID-19 were discussed with the patient and how to seek care for testing (follow up with PCP or arrange E-visit).  The importance of social distancing was discussed today.  Time:   Today, I have spent 26 minutes with the patient with telehealth technology discussing the above problems.     Medication Adjustments/Labs and Tests Ordered: Current medicines are reviewed at length with the patient today.  Concerns regarding medicines are outlined above.   Tests Ordered: No orders of the defined types were placed in this encounter.   Medication Changes: No orders of the defined types were placed in this encounter.  Patient Instructions  Medication Instructions:  Stop Potassium supplement  Continue all other medication  If you need a refill on your cardiac medications before your next appointment, please call your pharmacy.   Lab work: None ordered   Testing/Procedures: None ordered  Follow-Up: At [facility], you and your health needs are our priority.  As part of our continuing mission to provide you with exceptional heart care, we have created designated Provider Care Teams.  These Care Teams include your primary Cardiologist  (physician) and Advanced Practice Providers (APPs -  Physician Assistants and Nurse Practitioners) who all work together to provide you with the care you need, when you need it. . Take Blood Pressure Daily for 7 days send Blood Pressure Log to office for Dr.Riti Rollyson to review  . Schedule follow up appointment in 12 months   Call 3 months before to schedule      Disposition:  Follow up 12 months  Signed, Sanda Klein, MD  08/01/2018 9:30 AM    [place]

## 2018-08-03 ENCOUNTER — Encounter: Payer: Self-pay | Admitting: Cardiology

## 2018-08-03 NOTE — Progress Notes (Signed)
Remote pacemaker transmission.   

## 2018-08-08 DIAGNOSIS — M205X1 Other deformities of toe(s) (acquired), right foot: Secondary | ICD-10-CM | POA: Diagnosis not present

## 2018-08-08 DIAGNOSIS — M898X7 Other specified disorders of bone, ankle and foot: Secondary | ICD-10-CM | POA: Diagnosis not present

## 2018-08-08 DIAGNOSIS — M2141 Flat foot [pes planus] (acquired), right foot: Secondary | ICD-10-CM | POA: Diagnosis not present

## 2018-08-08 DIAGNOSIS — L602 Onychogryphosis: Secondary | ICD-10-CM | POA: Diagnosis not present

## 2018-08-20 DIAGNOSIS — G8929 Other chronic pain: Secondary | ICD-10-CM | POA: Diagnosis not present

## 2018-08-20 DIAGNOSIS — M79671 Pain in right foot: Secondary | ICD-10-CM | POA: Diagnosis not present

## 2018-08-20 DIAGNOSIS — Z01818 Encounter for other preprocedural examination: Secondary | ICD-10-CM | POA: Diagnosis not present

## 2018-08-20 DIAGNOSIS — M898X7 Other specified disorders of bone, ankle and foot: Secondary | ICD-10-CM | POA: Diagnosis not present

## 2018-08-27 DIAGNOSIS — M205X1 Other deformities of toe(s) (acquired), right foot: Secondary | ICD-10-CM | POA: Diagnosis not present

## 2018-08-27 DIAGNOSIS — M898X7 Other specified disorders of bone, ankle and foot: Secondary | ICD-10-CM | POA: Diagnosis not present

## 2018-08-27 DIAGNOSIS — M79671 Pain in right foot: Secondary | ICD-10-CM | POA: Diagnosis not present

## 2018-08-31 DIAGNOSIS — M898X7 Other specified disorders of bone, ankle and foot: Secondary | ICD-10-CM | POA: Diagnosis not present

## 2018-09-27 DIAGNOSIS — E782 Mixed hyperlipidemia: Secondary | ICD-10-CM | POA: Diagnosis not present

## 2018-09-27 DIAGNOSIS — M1000 Idiopathic gout, unspecified site: Secondary | ICD-10-CM | POA: Diagnosis not present

## 2018-09-27 DIAGNOSIS — I10 Essential (primary) hypertension: Secondary | ICD-10-CM | POA: Diagnosis not present

## 2018-10-04 ENCOUNTER — Other Ambulatory Visit: Payer: Self-pay

## 2018-10-04 DIAGNOSIS — Z23 Encounter for immunization: Secondary | ICD-10-CM | POA: Diagnosis not present

## 2018-10-04 DIAGNOSIS — I10 Essential (primary) hypertension: Secondary | ICD-10-CM | POA: Diagnosis not present

## 2018-10-04 DIAGNOSIS — R7303 Prediabetes: Secondary | ICD-10-CM | POA: Diagnosis not present

## 2018-10-04 DIAGNOSIS — E782 Mixed hyperlipidemia: Secondary | ICD-10-CM | POA: Diagnosis not present

## 2018-10-04 DIAGNOSIS — Z7189 Other specified counseling: Secondary | ICD-10-CM | POA: Diagnosis not present

## 2018-10-04 NOTE — Patient Outreach (Signed)
[facility]) Care Management  10/04/2018  [name] October 18, 1940 [id number]   Medication Adherence call to Mr. [name] HIPPA Compliant Voice message left with a call back number. [name] is showing past due on Losartan 100 mg and Atorvastatin 20 mg under [place].   [place] Management Direct Dial [phone]  Fax [phone] [email or web address]

## 2018-10-09 DIAGNOSIS — I87392 Chronic venous hypertension (idiopathic) with other complications of left lower extremity: Secondary | ICD-10-CM | POA: Diagnosis not present

## 2018-10-09 DIAGNOSIS — I87022 Postthrombotic syndrome with inflammation of left lower extremity: Secondary | ICD-10-CM | POA: Diagnosis not present

## 2018-10-12 ENCOUNTER — Other Ambulatory Visit: Payer: Self-pay

## 2018-10-12 NOTE — Patient Outreach (Signed)
[place] [facility]) Care Management  10/12/2018  [name] Dec 20, 1940 [id number]   Medication Adherence call to [name] [facility] Identifiers Verify spoke with patient he is past due on Losartan 100 mg and Atorvastatin 20 mg patient explain he is taking once daily on both medication he has some but ask if we can call [facility] to place an order.[facility] will mail out with in 5-7 days. [name] is showing past due under [facility] ins.   [place] Management Direct Dial [phone]  Fax [phone] [name]

## 2018-10-24 ENCOUNTER — Ambulatory Visit (INDEPENDENT_AMBULATORY_CARE_PROVIDER_SITE_OTHER): Payer: Medicare Other | Admitting: *Deleted

## 2018-10-24 DIAGNOSIS — I495 Sick sinus syndrome: Secondary | ICD-10-CM | POA: Diagnosis not present

## 2018-10-24 LAB — CUP PACEART REMOTE DEVICE CHECK
Battery Remaining Longevity: 159 mo
Battery Voltage: 3.05 V
Brady Statistic AP VP Percent: 13.53 %
Brady Statistic AP VS Percent: 1.96 %
Brady Statistic AS VP Percent: 4.44 %
Brady Statistic AS VS Percent: 80.06 %
Brady Statistic RA Percent Paced: 15.5 %
Brady Statistic RV Percent Paced: 17.97 %
Date Time Interrogation Session: 20200819061649
Implantable Lead Implant Date: 20190206
Implantable Lead Implant Date: 20190206
Implantable Lead Location: 753859
Implantable Lead Location: 753860
Implantable Lead Model: 5076
Implantable Lead Model: 5076
Implantable Pulse Generator Implant Date: 20190206
Lead Channel Impedance Value: 323 Ohm
Lead Channel Impedance Value: 380 Ohm
Lead Channel Impedance Value: 494 Ohm
Lead Channel Impedance Value: 532 Ohm
Lead Channel Pacing Threshold Amplitude: 0.5 V
Lead Channel Pacing Threshold Amplitude: 0.625 V
Lead Channel Pacing Threshold Pulse Width: 0.4 ms
Lead Channel Pacing Threshold Pulse Width: 0.4 ms
Lead Channel Sensing Intrinsic Amplitude: 3 mV
Lead Channel Sensing Intrinsic Amplitude: 3 mV
Lead Channel Sensing Intrinsic Amplitude: 6.5 mV
Lead Channel Sensing Intrinsic Amplitude: 6.5 mV
Lead Channel Setting Pacing Amplitude: 2 V
Lead Channel Setting Pacing Amplitude: 2.5 V
Lead Channel Setting Pacing Pulse Width: 0.4 ms
Lead Channel Setting Sensing Sensitivity: 0.9 mV

## 2018-11-01 ENCOUNTER — Encounter: Payer: Self-pay | Admitting: Cardiology

## 2018-11-01 NOTE — Progress Notes (Signed)
Remote pacemaker transmission.   

## 2018-11-23 IMAGING — US US ABDOMEN COMPLETE
1 series · 13 of 25 positions shown · non-contrast
Comparison: CT 02/15/2015

CLINICAL DATA: Jaundice, pancreatitis

EXAM:
ABDOMEN ULTRASOUND COMPLETE

[Series 1: us abdomen complete · 0.27mm/px · 13 of 107 slices shown]
[im 1/107]
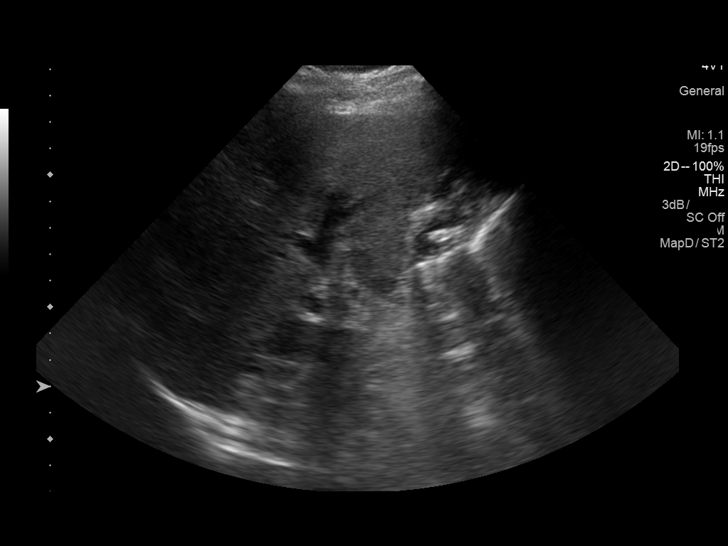
[im 9/107]
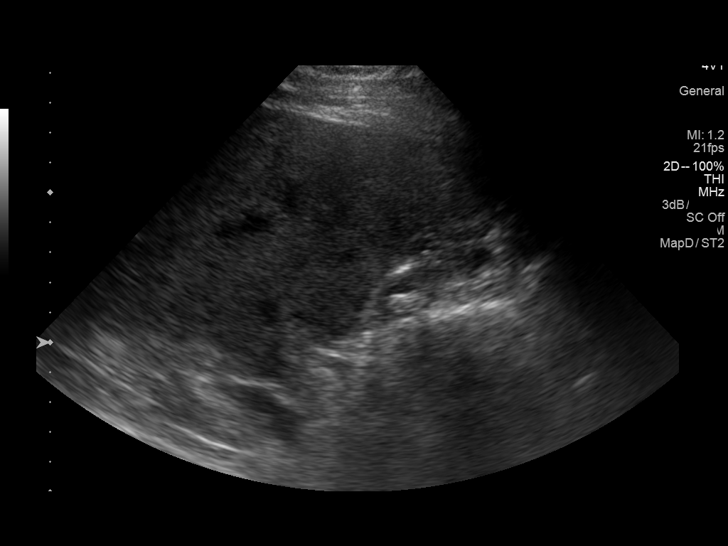
[im 18/107]
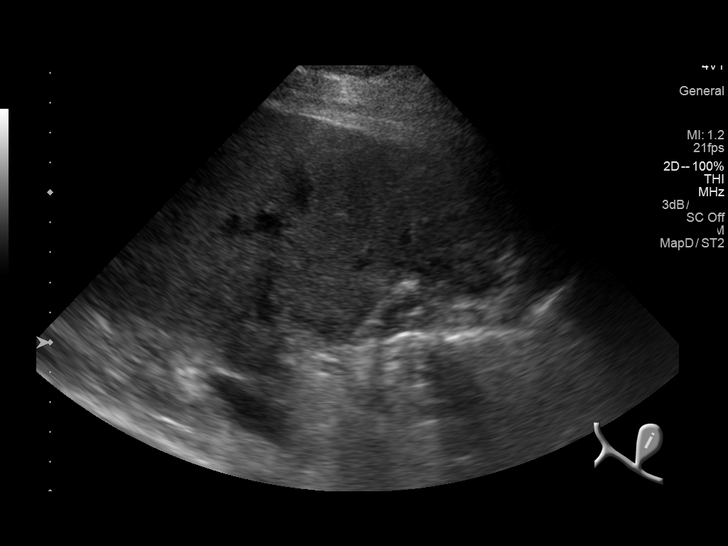
[im 27/107]
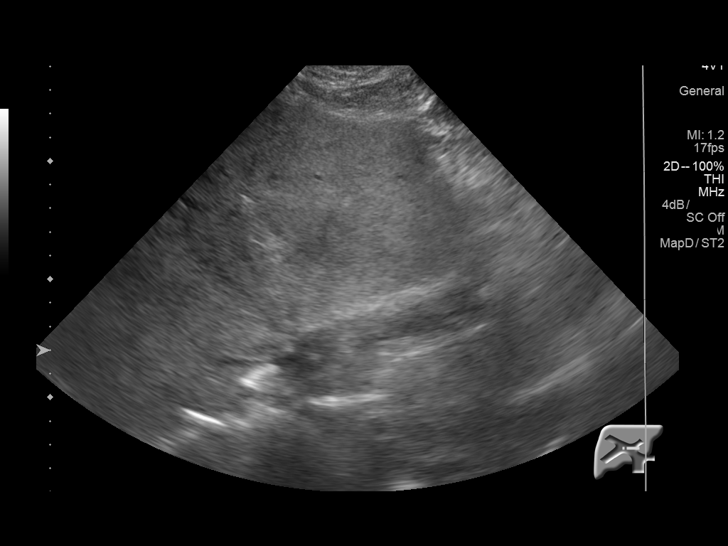
[im 36/107]
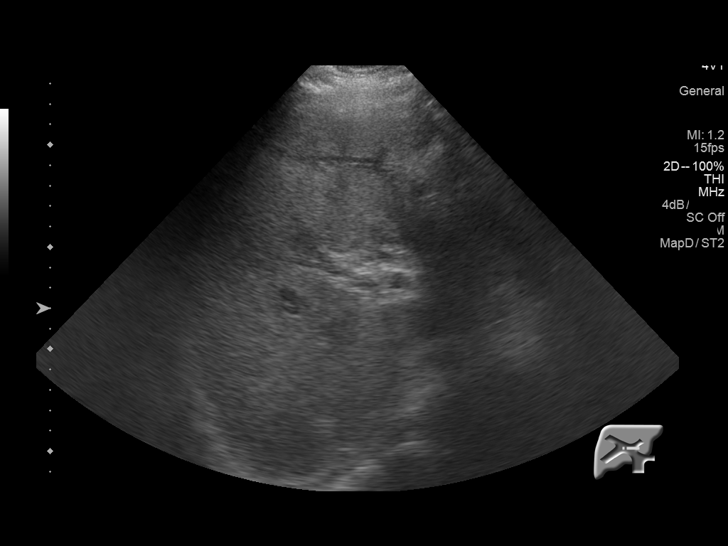
[im 45/107]
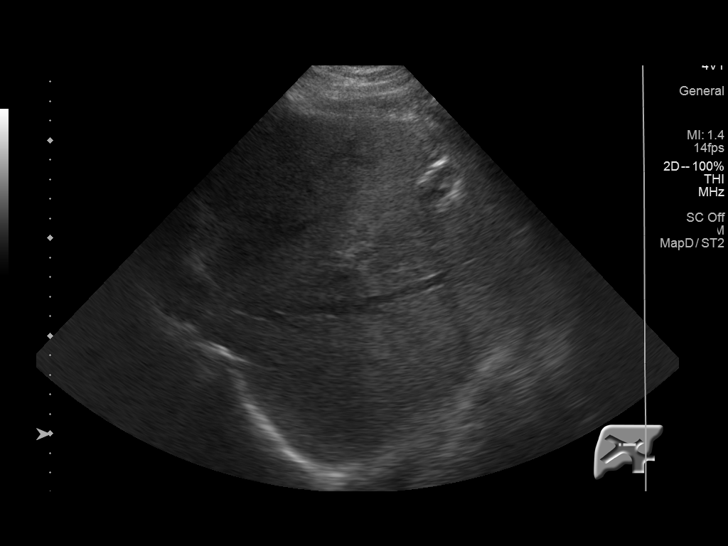
[im 54/107]
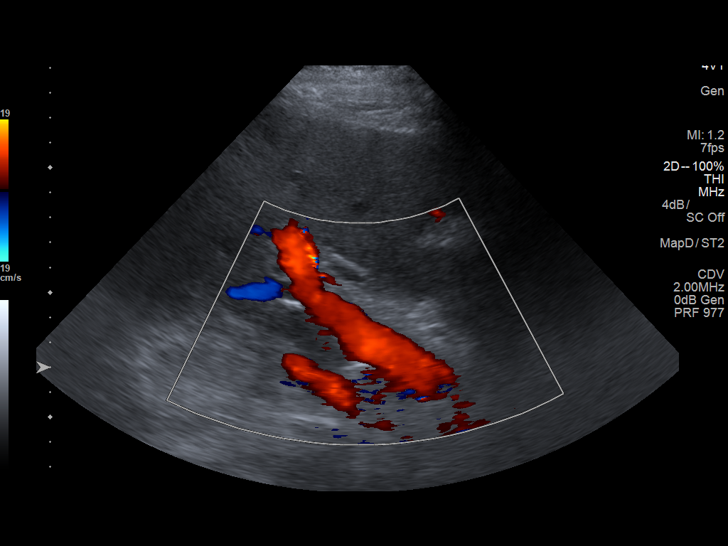
[im 62/107]
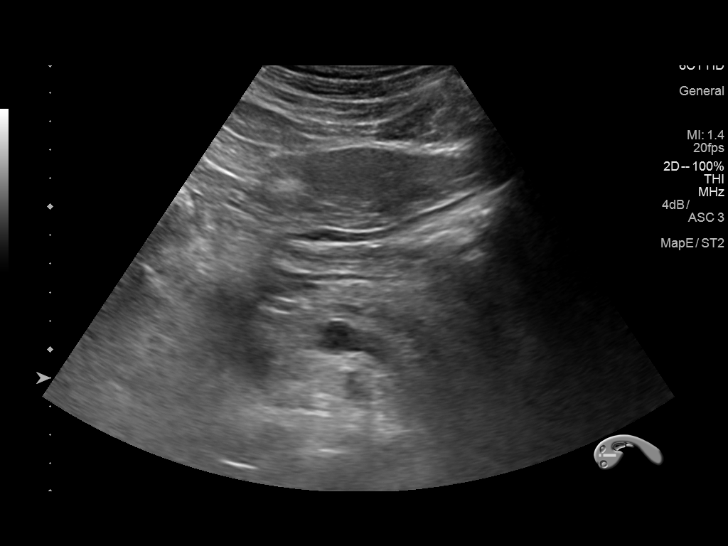
[im 71/107]
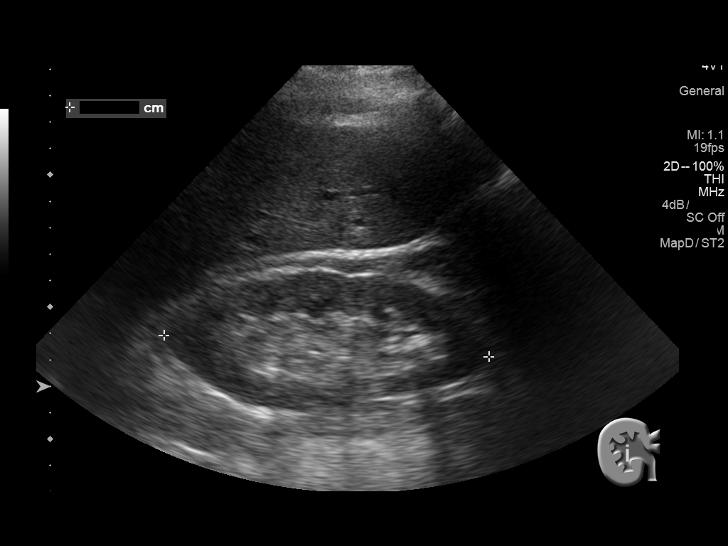
[im 80/107]
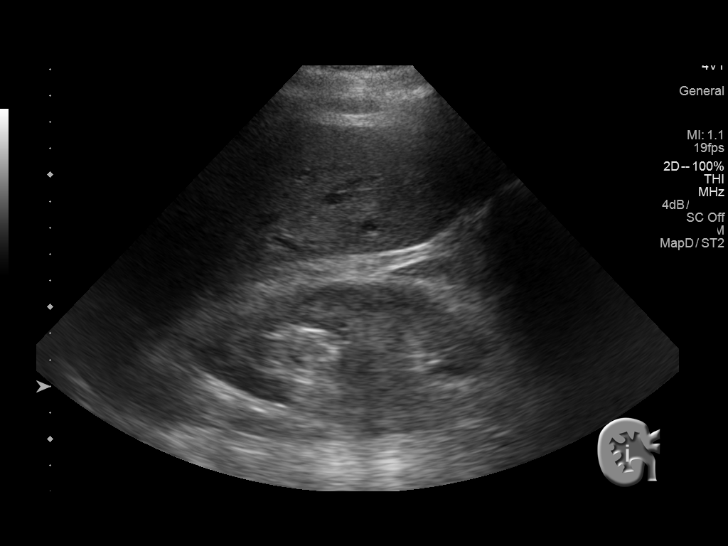
[im 89/107]
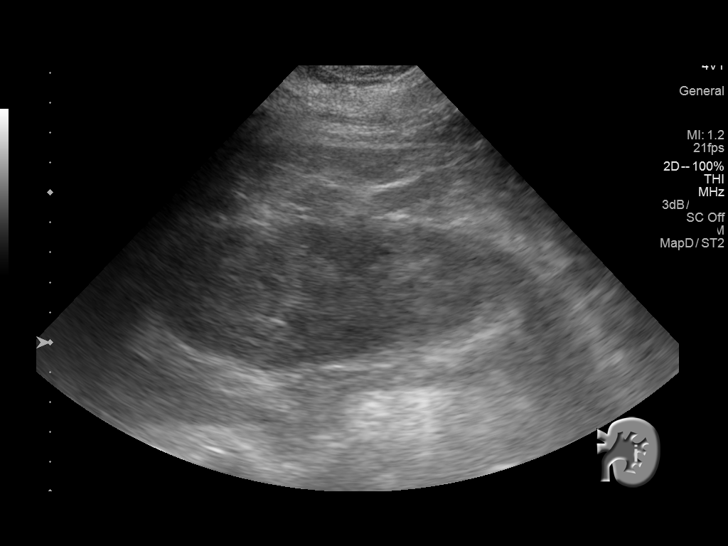
[im 98/107]
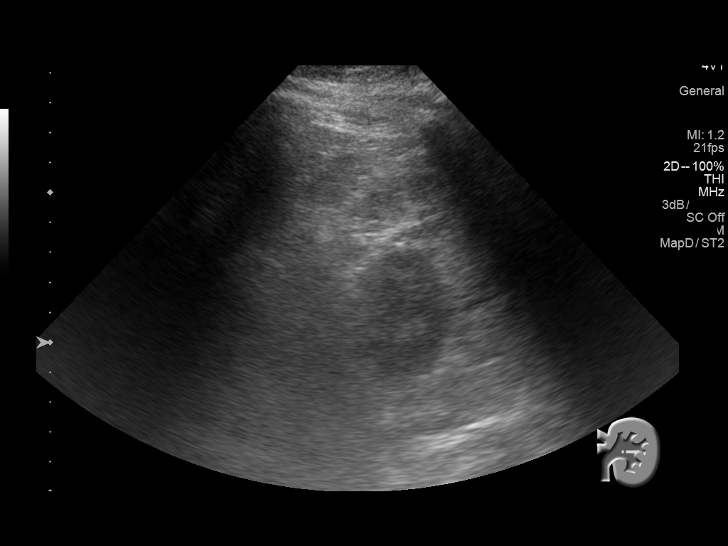
[im 107/107]
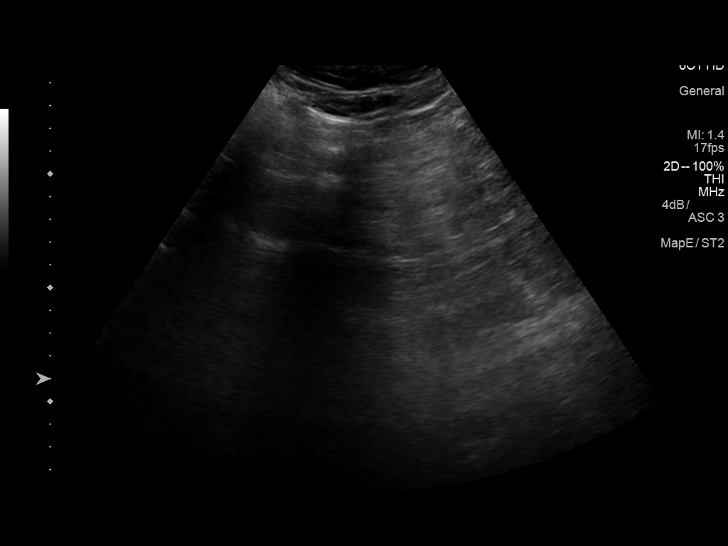

[13 of 25 positions shown; findings below may reference images not displayed]

FINDINGS: Gallbladder: Numerous small stones filling the gallbladder, the
largest 9 mm. The gallbladder fundus is not well visualized due to
overlying shadowing. Gallbladder wall borderline in thickness at 3
mm. Negative sonographic [place].

Common bile duct: Diameter: Normal caliber, 6 mm

Liver: Increased echotexture compatible with fatty infiltration. No
focal abnormality or biliary ductal dilatation. Portal vein is
patent on color Doppler imaging with normal direction of blood flow
towards the liver.

IVC: No abnormality visualized.

Pancreas: Pancreas not well visualized due to overlying bowel gas.
Visualized portions unremarkable. No ductal dilatation.

Spleen: Size and appearance within normal limits.

Right Kidney: Length: 12.3 cm. Shadowing stone in the lower pole. No
hydronephrosis. Normal echotexture..

Left Kidney: Length: 12.9 cm. Echogenicity within normal limits. No
mass or hydronephrosis visualized.

Abdominal aorta: No aneurysm visualized.

Other findings: None.
IMPRESSION: Cholelithiasis with numerous gallstones. No sonographic evidence of
acute cholecystitis.

Fatty liver.

Right lower pole nephrolithiasis.

## 2018-11-30 DIAGNOSIS — Z23 Encounter for immunization: Secondary | ICD-10-CM | POA: Diagnosis not present

## 2018-11-30 DIAGNOSIS — Z7189 Other specified counseling: Secondary | ICD-10-CM | POA: Diagnosis not present

## 2018-11-30 DIAGNOSIS — M19041 Primary osteoarthritis, right hand: Secondary | ICD-10-CM | POA: Diagnosis not present

## 2018-12-07 DIAGNOSIS — Z7189 Other specified counseling: Secondary | ICD-10-CM | POA: Diagnosis not present

## 2018-12-07 DIAGNOSIS — M19041 Primary osteoarthritis, right hand: Secondary | ICD-10-CM | POA: Diagnosis not present

## 2019-01-18 DIAGNOSIS — E782 Mixed hyperlipidemia: Secondary | ICD-10-CM | POA: Diagnosis not present

## 2019-01-18 DIAGNOSIS — I10 Essential (primary) hypertension: Secondary | ICD-10-CM | POA: Diagnosis not present

## 2019-01-23 ENCOUNTER — Ambulatory Visit (INDEPENDENT_AMBULATORY_CARE_PROVIDER_SITE_OTHER): Payer: Medicare Other | Admitting: *Deleted

## 2019-01-23 DIAGNOSIS — I471 Supraventricular tachycardia: Secondary | ICD-10-CM

## 2019-01-23 DIAGNOSIS — I495 Sick sinus syndrome: Secondary | ICD-10-CM

## 2019-01-24 LAB — CUP PACEART REMOTE DEVICE CHECK
Battery Remaining Longevity: 155 mo
Battery Voltage: 3.04 V
Brady Statistic AP VP Percent: 14.65 %
Brady Statistic AP VS Percent: 4.14 %
Brady Statistic AS VP Percent: 4.74 %
Brady Statistic AS VS Percent: 76.47 %
Brady Statistic RA Percent Paced: 18.77 %
Brady Statistic RV Percent Paced: 19.39 %
Date Time Interrogation Session: 20201118011858
Implantable Lead Implant Date: 20190206
Implantable Lead Implant Date: 20190206
Implantable Lead Location: 753859
Implantable Lead Location: 753860
Implantable Lead Model: 5076
Implantable Lead Model: 5076
Implantable Pulse Generator Implant Date: 20190206
Lead Channel Impedance Value: 323 Ohm
Lead Channel Impedance Value: 361 Ohm
Lead Channel Impedance Value: 494 Ohm
Lead Channel Impedance Value: 532 Ohm
Lead Channel Pacing Threshold Amplitude: 0.5 V
Lead Channel Pacing Threshold Amplitude: 0.625 V
Lead Channel Pacing Threshold Pulse Width: 0.4 ms
Lead Channel Pacing Threshold Pulse Width: 0.4 ms
Lead Channel Sensing Intrinsic Amplitude: 2.625 mV
Lead Channel Sensing Intrinsic Amplitude: 6.375 mV
Lead Channel Sensing Intrinsic Amplitude: 6.375 mV
Lead Channel Setting Pacing Amplitude: 2 V
Lead Channel Setting Pacing Amplitude: 2.5 V
Lead Channel Setting Pacing Pulse Width: 0.4 ms
Lead Channel Setting Sensing Sensitivity: 0.9 mV

## 2019-01-25 DIAGNOSIS — M1000 Idiopathic gout, unspecified site: Secondary | ICD-10-CM | POA: Diagnosis not present

## 2019-01-25 DIAGNOSIS — E782 Mixed hyperlipidemia: Secondary | ICD-10-CM | POA: Diagnosis not present

## 2019-01-25 DIAGNOSIS — I10 Essential (primary) hypertension: Secondary | ICD-10-CM | POA: Diagnosis not present

## 2019-02-13 IMAGING — DX DG CHEST 2V
2 series · 2 of 2 positions shown · non-contrast
Comparison: 12/28/2016

CLINICAL DATA: Pacemaker placement

EXAM:
CHEST  2 VIEW

[chest lat]
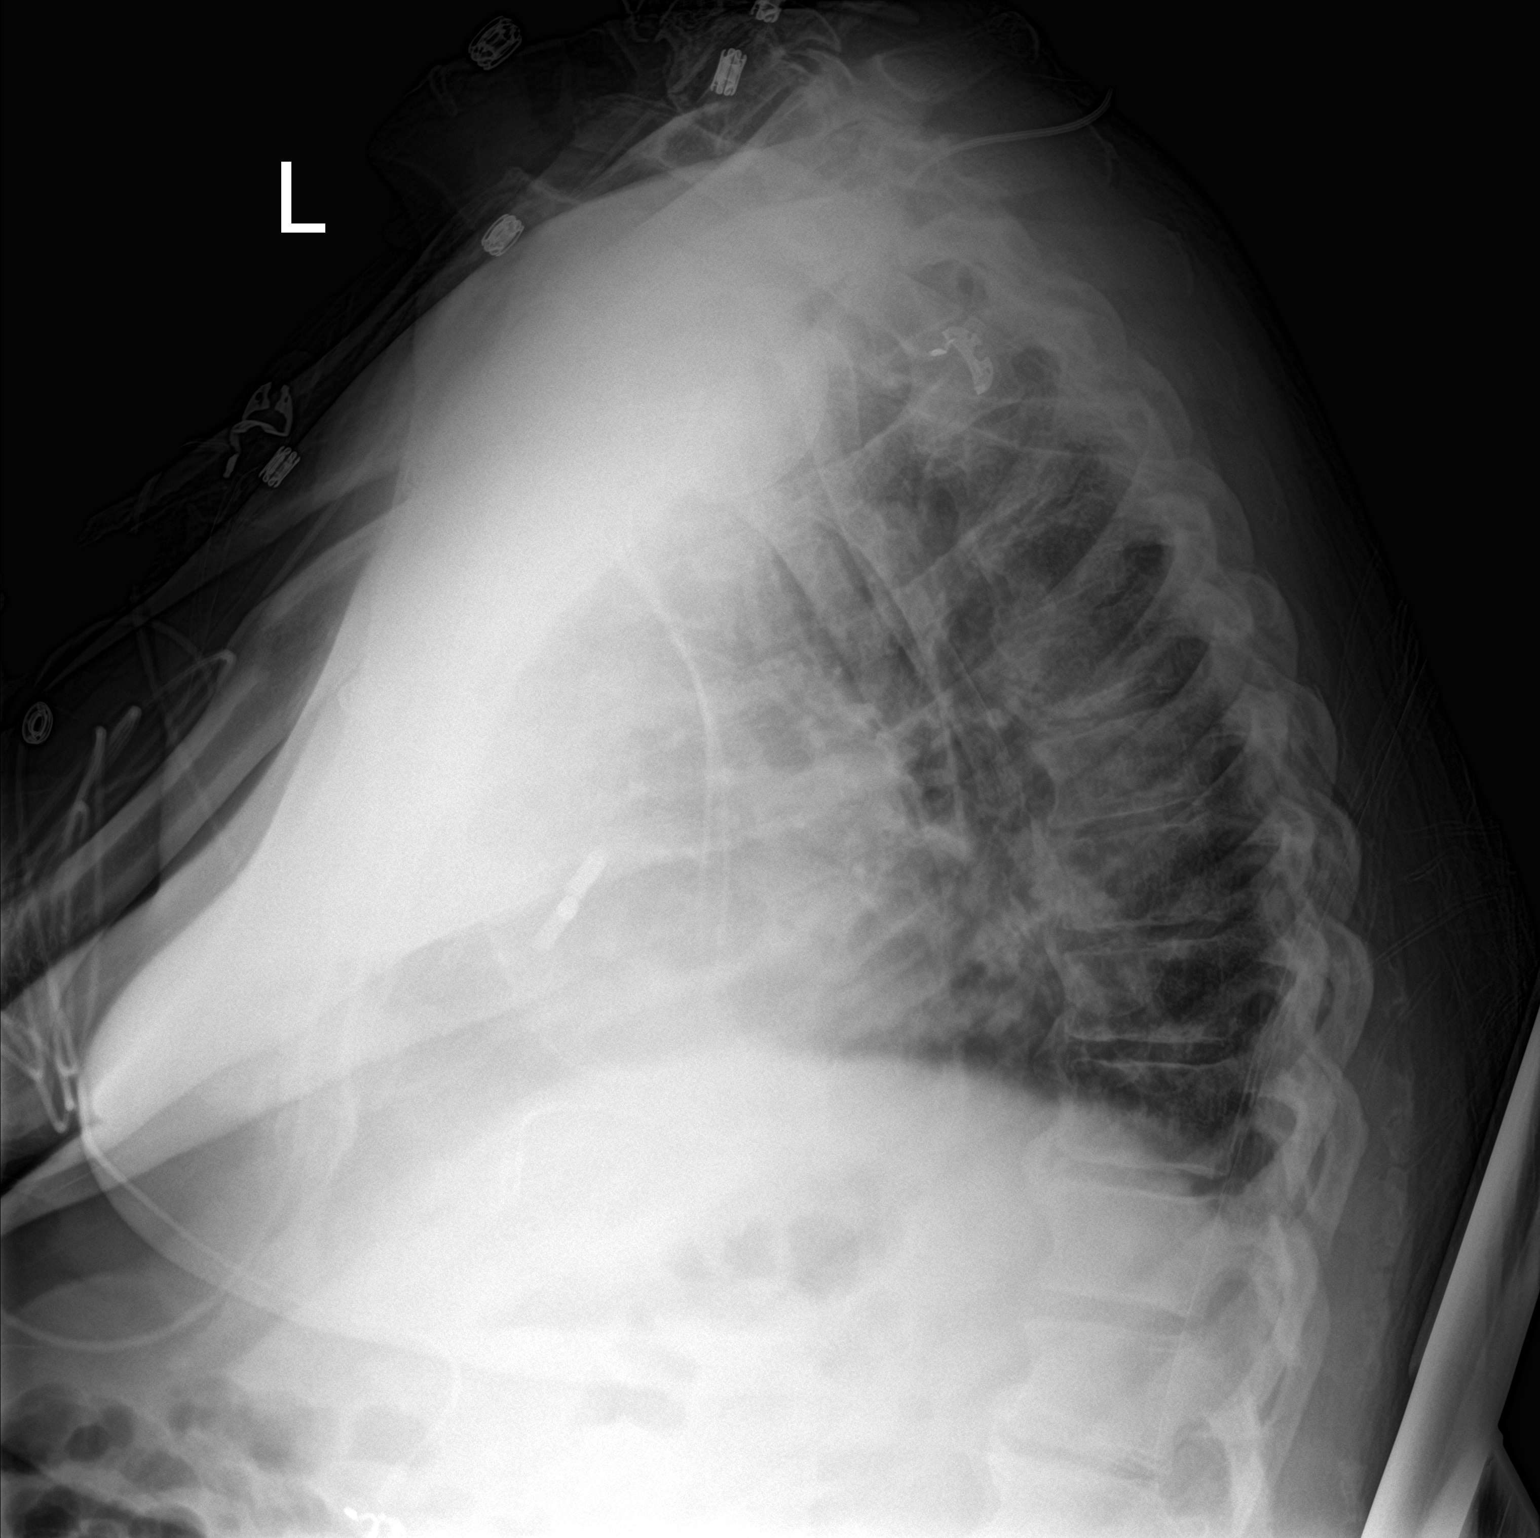

[chest ap]
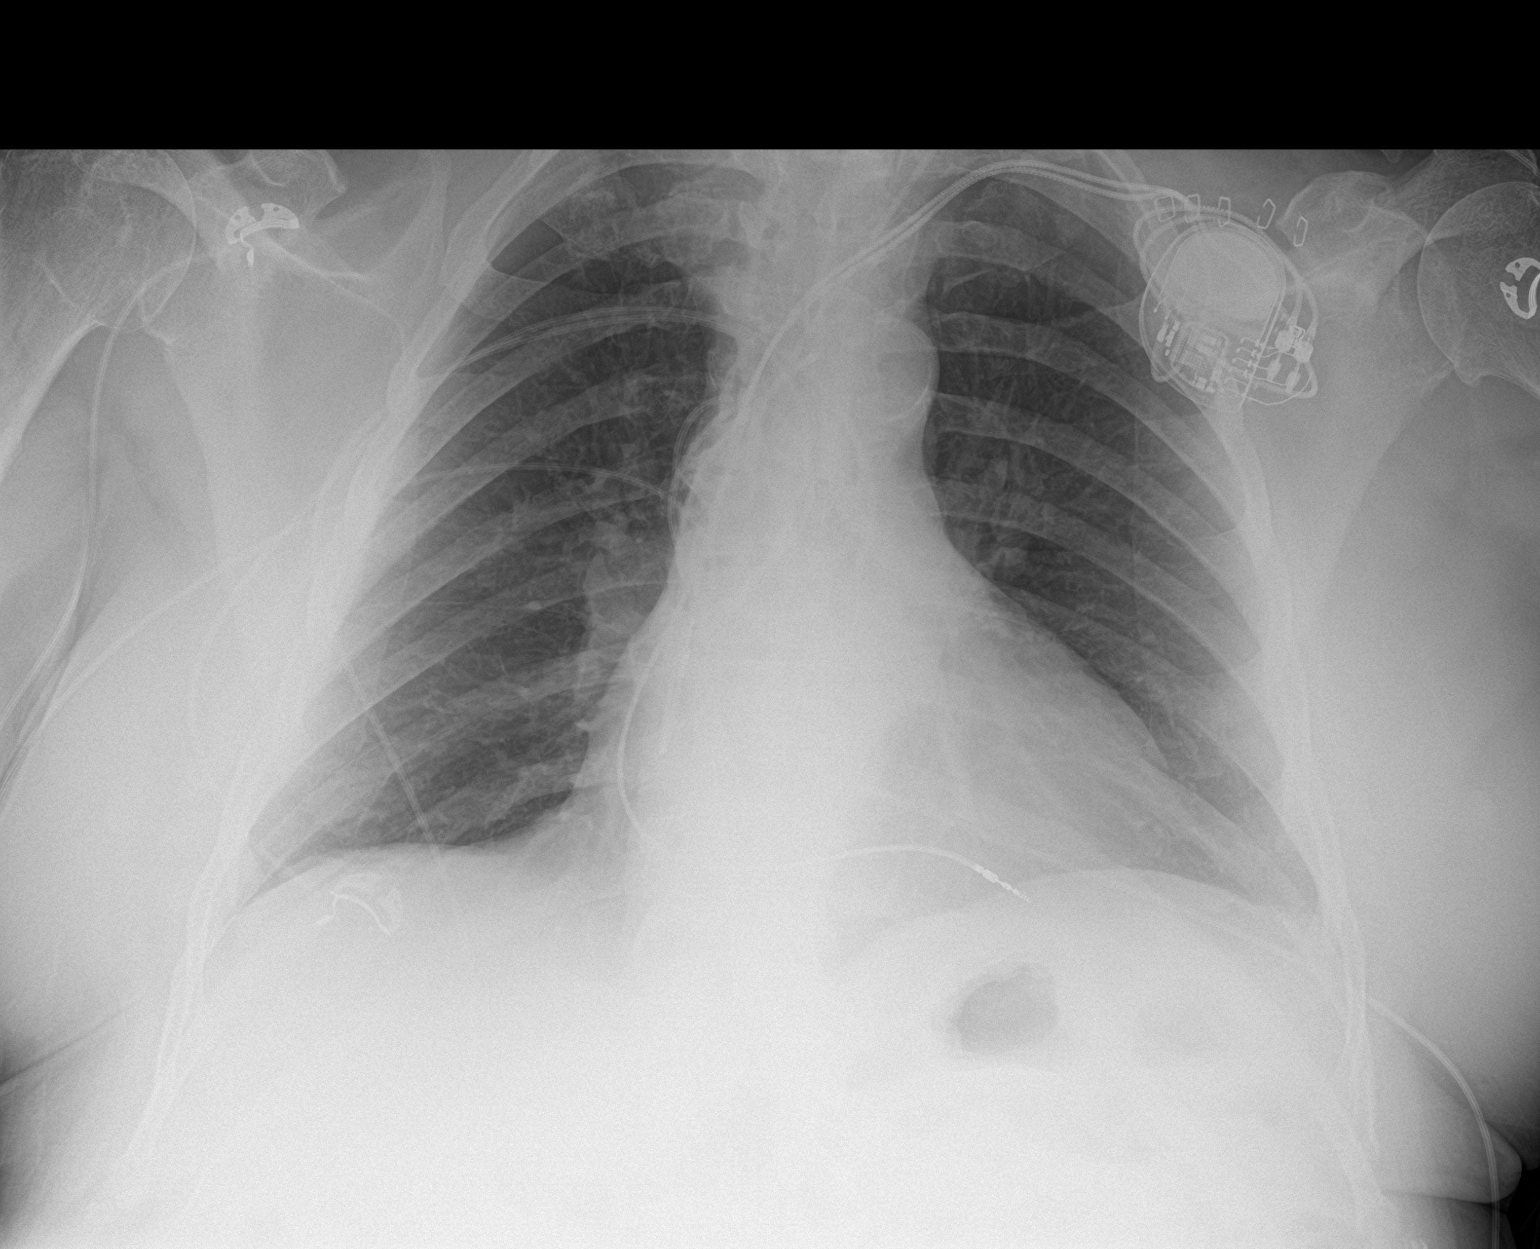

[2 of 2 positions shown; findings below may reference images not displayed]

FINDINGS: Left pacer is in place with leads in the right atrium and right
ventricle. No pneumothorax. Heart is borderline in size. Lungs are
clear. No effusions or acute bony abnormality.
IMPRESSION: Left pacer placement without pneumothorax.  No active disease.

## 2019-02-21 NOTE — Progress Notes (Signed)
Remote pacemaker transmission.   

## 2019-02-27 DIAGNOSIS — R3915 Urgency of urination: Secondary | ICD-10-CM | POA: Diagnosis not present

## 2019-02-27 DIAGNOSIS — R3912 Poor urinary stream: Secondary | ICD-10-CM | POA: Diagnosis not present

## 2019-04-03 DIAGNOSIS — H02831 Dermatochalasis of right upper eyelid: Secondary | ICD-10-CM | POA: Diagnosis not present

## 2019-04-03 DIAGNOSIS — H40053 Ocular hypertension, bilateral: Secondary | ICD-10-CM | POA: Diagnosis not present

## 2019-04-03 DIAGNOSIS — H02832 Dermatochalasis of right lower eyelid: Secondary | ICD-10-CM | POA: Diagnosis not present

## 2019-04-03 DIAGNOSIS — H40033 Anatomical narrow angle, bilateral: Secondary | ICD-10-CM | POA: Diagnosis not present

## 2019-04-03 DIAGNOSIS — H02834 Dermatochalasis of left upper eyelid: Secondary | ICD-10-CM | POA: Diagnosis not present

## 2019-04-07 ENCOUNTER — Emergency Department (HOSPITAL_COMMUNITY)
Admission: EM | Admit: 2019-04-07 | Discharge: 2019-04-07 | Disposition: A | Discharge status: Home / Self Care | Payer: Medicare Other | Attending: Emergency Medicine | Admitting: Emergency Medicine

## 2019-04-07 ENCOUNTER — Emergency Department (HOSPITAL_COMMUNITY): Payer: Medicare Other

## 2019-04-07 ENCOUNTER — Other Ambulatory Visit: Payer: Self-pay

## 2019-04-07 ENCOUNTER — Encounter (HOSPITAL_COMMUNITY): Payer: Self-pay | Admitting: Emergency Medicine

## 2019-04-07 DIAGNOSIS — R55 Syncope and collapse: Secondary | ICD-10-CM | POA: Insufficient documentation

## 2019-04-07 DIAGNOSIS — I10 Essential (primary) hypertension: Secondary | ICD-10-CM | POA: Insufficient documentation

## 2019-04-07 DIAGNOSIS — Z7982 Long term (current) use of aspirin: Secondary | ICD-10-CM | POA: Diagnosis not present

## 2019-04-07 DIAGNOSIS — Z950 Presence of cardiac pacemaker: Secondary | ICD-10-CM | POA: Insufficient documentation

## 2019-04-07 DIAGNOSIS — I441 Atrioventricular block, second degree: Secondary | ICD-10-CM | POA: Diagnosis not present

## 2019-04-07 DIAGNOSIS — R61 Generalized hyperhidrosis: Secondary | ICD-10-CM | POA: Diagnosis not present

## 2019-04-07 DIAGNOSIS — I213 ST elevation (STEMI) myocardial infarction of unspecified site: Secondary | ICD-10-CM | POA: Diagnosis not present

## 2019-04-07 DIAGNOSIS — R42 Dizziness and giddiness: Secondary | ICD-10-CM | POA: Diagnosis not present

## 2019-04-07 DIAGNOSIS — R000 Tachycardia, unspecified: Secondary | ICD-10-CM | POA: Diagnosis not present

## 2019-04-07 DIAGNOSIS — Z79899 Other long term (current) drug therapy: Secondary | ICD-10-CM | POA: Insufficient documentation

## 2019-04-07 DIAGNOSIS — Z743 Need for continuous supervision: Secondary | ICD-10-CM | POA: Diagnosis not present

## 2019-04-07 LAB — URINALYSIS, ROUTINE W REFLEX MICROSCOPIC
Bacteria, UA: NONE SEEN
Bilirubin Urine: NEGATIVE
Glucose, UA: NEGATIVE mg/dL
Ketones, ur: NEGATIVE mg/dL
Leukocytes,Ua: NEGATIVE
Nitrite: NEGATIVE
Protein, ur: NEGATIVE mg/dL
Specific Gravity, Urine: 1.016 (ref 1.005–1.030)
pH: 5 (ref 5.0–8.0)

## 2019-04-07 LAB — CBC
HCT: 41.8 % (ref 39.0–52.0)
Hemoglobin: 13.4 g/dL (ref 13.0–17.0)
MCH: 29.8 pg (ref 26.0–34.0)
MCHC: 32.1 g/dL (ref 30.0–36.0)
MCV: 93.1 fL (ref 80.0–100.0)
Platelets: 220 10*3/uL (ref 150–400)
RBC: 4.49 MIL/uL (ref 4.22–5.81)
RDW: 13.4 % (ref 11.5–15.5)
WBC: 7.2 10*3/uL (ref 4.0–10.5)
nRBC: 0 % (ref 0.0–0.2)

## 2019-04-07 LAB — BASIC METABOLIC PANEL
Anion gap: 10 (ref 5–15)
BUN: 19 mg/dL (ref 8–23)
CO2: 21 mmol/L — ABNORMAL LOW (ref 22–32)
Calcium: 9 mg/dL (ref 8.9–10.3)
Chloride: 109 mmol/L (ref 98–111)
Creatinine, Ser: 1.33 mg/dL — ABNORMAL HIGH (ref 0.61–1.24)
GFR calc Af Amer: 59 mL/min — ABNORMAL LOW (ref >60)
GFR calc non Af Amer: 51 mL/min — ABNORMAL LOW (ref >60)
Glucose, Bld: 117 mg/dL — ABNORMAL HIGH (ref 70–99)
Potassium: 4.4 mmol/L (ref 3.5–5.1)
Sodium: 140 mmol/L (ref 135–145)

## 2019-04-07 LAB — CBG MONITORING, ED: Glucose-Capillary: 111 mg/dL — ABNORMAL HIGH (ref 70–99)

## 2019-04-07 LAB — TROPONIN I (HIGH SENSITIVITY): Troponin I (High Sensitivity): 9 ng/L (ref <18)

## 2019-04-07 NOTE — Discharge Instructions (Signed)
Please return to ER if you have any further episodes of lightheadedness, any numbness, weakness, chest pain, passing out or other new concerning symptom.  Please call your primary doctor as well as your cardiologist to get follow-up next week.

## 2019-04-07 NOTE — ED Provider Notes (Signed)
[facility] EMERGENCY DEPARTMENT Provider Note   CSN: [id number] Arrival date & time: 04/07/19  1743     History Chief Complaint  Patient presents with  . Dizziness    [name] is a 79 y.o. male.  Presented to ER with complaint of near syncopal episode.  Patient states he was watching TV on the couch when he stood up and subsequently felt lightheaded.  Sensation of lightheadedness lasted for around a few minutes, at most 10 minutes.  No room spinning, no loss of consciousness.  No chest pain or difficulty breathing was associated with this.  He otherwise felt in his normal state of health.  Since that episode, he has had no additional symptoms.  HPI     Past Medical History:  Diagnosis Date  . Arthritis    "probably; hands" (04/12/2017)  . High cholesterol   . History of gout   . Hypertension   . OSA (obstructive sleep apnea)    per pt noncompliant CPAP has not use it since 2015  . Presence of permanent cardiac pacemaker 04/12/2017   Dual Chamber  . Wears dentures    upper    Patient Active Problem List   Diagnosis Date Noted  . Hypercholesteremia 08/01/2018  . Acute biliary pancreatitis 08/17/2017  . Pacemaker 04/12/2017  . Syncope and collapse 01/23/2017  . Second degree atrioventricular block, Mobitz (type) I 01/23/2017  . SSS (sick sinus syndrome) ([place]) 01/23/2017  . SVT (supraventricular tachycardia) ([place]) 01/23/2017  . Other fatigue 01/23/2017  . Chest pain 12/28/2016  . Essential hypertension 12/28/2016  . BRBPR (bright red blood per rectum) 11/04/2010  . Mild renal insufficiency 11/04/2010  . Sleep apnea 11/04/2010    Past Surgical History:  Procedure Laterality Date  . CHOLECYSTECTOMY N/A 08/19/2017   Procedure: LAPAROSCOPIC ASSISTED OPEN CHOLECYSTECTOMY WITH INTRAOPERATIVE CHOLANGIOGRAM;  Surgeon: Armandina Gemma, MD;  [facility];  Service: General;  Laterality: N/A;  . ELBOW BURSA SURGERY Left 07-21-2009   extensive  bursectomy/ tenosynovectomy/ ulnar nerve release  . EUS N/A 08/18/2017   Procedure: ESOPHAGEAL ENDOSCOPIC ULTRASOUND (EUS) RADIAL;  Surgeon: Milus Banister, MD;  [facility];  Service: Endoscopy;  Laterality: N/A;  . HAMMER TOE SURGERY Right 2015  . HAMMER TOE SURGERY Right 12/11/2014   Procedure: 4th HAMMER TOE CORRECTION, EXCISION LESION 3rd TOE RIGHT FOOT;  Surgeon: Rosemary Holms, DPM;  Location: [facility];  Service: Podiatry;  Laterality: Right;  . INSERT / REPLACE / REMOVE PACEMAKER  04/12/2017   Dual Chamber  . MICROLARYNGOSCOPY  06-07-2006   w/  Excision bilateral vocal cord lesions (bilateral benign nodules)  . PACEMAKER IMPLANT N/A 04/12/2017   Procedure: PACEMAKER IMPLANT - Dual Chamber;  Surgeon: Sanda Klein, MD;  Location: [facility];  Service: Cardiovascular;  Laterality: N/A;       Family History  Problem Relation Age of Onset  . Stroke Mother 56       had gangrene of toe, then later "stroke" per patient  . Osteoarthritis Father   . Heart attack Father 49  . Heart attack Sister        sudden cardiac arrest  . Colon cancer Neg Hx     Social History   Tobacco Use  . Smoking status: Never Smoker  . Smokeless tobacco: Never Used  Substance Use Topics  . Alcohol use: Yes    Alcohol/week: 2.0 standard drinks    Types: 2 Standard drinks or equivalent per week  .  Drug use: No    Home Medications Prior to Admission medications   Medication Sig Start Date End Date Taking? Authorizing Provider  amLODipine (NORVASC) 2.5 MG tablet Take 2.5 mg by mouth daily. 04/03/19  Yes [provider]  aspirin EC 81 MG tablet Take 81 mg by mouth daily.   Yes [provider]  atorvastatin (LIPITOR) 20 MG tablet Take 20 mg by mouth daily.   Yes [provider]  Multiple Vitamin (MULTIVITAMIN WITH MINERALS) TABS tablet Take 1 tablet by mouth daily.   Yes [provider]  nitroGLYCERIN (NITROSTAT) 0.4 MG SL tablet  Place 1 tablet (0.4 mg total) under the tongue every 5 (five) minutes as needed for chest pain. 08/01/18 06/05/19 Yes Croitoru, Mihai, MD  potassium chloride SA (KLOR-CON) 20 MEQ tablet Take 20 mEq by mouth daily. 12/14/18  Yes [provider]  tamsulosin (FLOMAX) 0.4 MG CAPS capsule Take 0.4 mg by mouth daily. 04/03/19  Yes [provider]    Allergies    Patient has no known allergies.  Review of Systems   Review of Systems  Constitutional: Negative for chills and fever.  HENT: Negative for ear pain and sore throat.   Eyes: Negative for pain and visual disturbance.  Respiratory: Negative for cough and shortness of breath.   Cardiovascular: Negative for chest pain and palpitations.  Gastrointestinal: Negative for abdominal pain and vomiting.  Genitourinary: Negative for dysuria and hematuria.  Musculoskeletal: Negative for arthralgias and back pain.  Skin: Negative for color change and rash.  Neurological: Negative for seizures and syncope.  All other systems reviewed and are negative.   Physical Exam Updated Vital Signs BP (!) 148/83   Pulse 64   Temp 97.7 F (36.5 C) (Oral)   Resp 16   SpO2 100%   Physical Exam Vitals and nursing note reviewed.  Constitutional:      Appearance: He is well-developed.  HENT:     Head: Normocephalic and atraumatic.  Eyes:     Conjunctiva/sclera: Conjunctivae normal.  Cardiovascular:     Rate and Rhythm: Normal rate and regular rhythm.     Heart sounds: No murmur.  Pulmonary:     Effort: Pulmonary effort is normal. No respiratory distress.     Breath sounds: Normal breath sounds.  Abdominal:     Palpations: Abdomen is soft.     Tenderness: There is no abdominal tenderness.  Musculoskeletal:     Cervical back: Neck supple.  Skin:    General: Skin is warm and dry.  Neurological:     Mental Status: He is alert.     ED Results / Procedures / Treatments   Labs (all labs ordered are listed, but only abnormal results  are displayed) Labs Reviewed  BASIC METABOLIC PANEL - Abnormal; Notable for the following components:      Result Value   CO2 21 (*)    Glucose, Bld 117 (*)    Creatinine, Ser 1.33 (*)    GFR calc non Af Amer 51 (*)    GFR calc Af Amer 59 (*)    All other components within normal limits  URINALYSIS, ROUTINE W REFLEX MICROSCOPIC - Abnormal; Notable for the following components:   Hgb urine dipstick MODERATE (*)    All other components within normal limits  CBG MONITORING, ED - Abnormal; Notable for the following components:   Glucose-Capillary 111 (*)    All other components within normal limits  CBC  CBG MONITORING, ED  TROPONIN I (HIGH  SENSITIVITY)    EKG EKG Interpretation  Date/Time:  Sunday April 07 2019 17:44:27 EST Ventricular Rate:  96 PR Interval:    QRS Duration: 176 QT Interval:  414 QTC Calculation: 523 R Axis:   -79 Text Interpretation: Ventricular-paced rhythm Abnormal ECG Confirmed by Marbin Olshefski ([id number]) on 04/07/2019 7:17:08 PM   Radiology DG Chest 2 View  Result Date: 04/07/2019 CLINICAL DATA:  Dizziness. EXAM: CHEST - 2 VIEW COMPARISON:  April 13, 2017 FINDINGS: There is a dual lead AICD. There is no evidence of acute infiltrate, pleural effusion or pneumothorax. The heart size and mediastinal contours are within normal limits. There is moderate severity calcification of the aortic arch. Degenerative changes seen throughout the thoracic spine. Radiopaque surgical clips are seen overlying the right upper quadrant. IMPRESSION: No active cardiopulmonary disease. Electronically Signed   By: Thaddeus  Houston M.D.   On: 04/07/2019 21:02    Procedures Procedures (including critical care time)  Medications Ordered in ED Medications - No data to display  ED Course  I have reviewed the triage vital signs and the nursing notes.  Pertinent labs & imaging results that were available during my care of the patient were reviewed by me and considered in my  medical decision making (see chart for details).  Clinical Course as of Apr 06 2330  Sun Apr 07, 2019  2207 Recheck patient, updated on results, will discharge home   [RD]    Clinical Course User Index [RD] Kyley Solow S, MD   MDM Rules/Calculators/A&P                      78  year old male past medical history sick sinus syndrome s/p pacemaker placement presenting to ER with complaint of near syncopal episode.  Here patient well-appearing, stable vital signs, asymptomatic.  Pacemaker interrogated, no cardiac events.  Labs today were within normal limits, no acute changes.  Troponin within normal limits.  Suspect vasovagal episode given description.  Believe he is appropriate for discharge and outpatient management this time.  Has appointment with primary doctor next week, recommended follow-up with cardiologist as well.    After the discussed management above, the patient was determined to be safe for discharge.  The patient was in agreement with this plan and all questions regarding their care were answered.  ED return precautions were discussed and the patient will return to the ED with any significant worsening of condition.   Final Clinical Impression(s) / ED Diagnoses Final diagnoses:  Near syncope    Rx / DC Orders ED Discharge Orders    None       Lucrezia Starch, MD 04/07/19 2335

## 2019-04-07 NOTE — ED Notes (Signed)
Pt [employer] pacemaker interrogated. Spoke to [employer]. Pacemaker is currently functioning as programed with atrial sensing and ventricular pacing.

## 2019-04-07 NOTE — ED Triage Notes (Signed)
Per [facility] pt coming from home c/o dizziness when standing since yesterday. Denies any N/V/D. Normal appetite. Has pacemaker.

## 2019-04-07 NOTE — ED Notes (Signed)
Discharge instructions reviewed with pt. Pt verbalized understanding.   

## 2019-04-07 NOTE — ED Notes (Signed)
States his dizziness is only with getting up, and sx have subsided at this time.  Denies any pain.  Alert and oriented with NAD.

## 2019-04-07 NOTE — ED Notes (Signed)
Pt transported to XRAY °

## 2019-04-09 DIAGNOSIS — R55 Syncope and collapse: Secondary | ICD-10-CM | POA: Diagnosis not present

## 2019-04-09 DIAGNOSIS — I10 Essential (primary) hypertension: Secondary | ICD-10-CM | POA: Diagnosis not present

## 2019-04-12 NOTE — Progress Notes (Signed)
Cardiology Office Note   Date:  04/18/2019   ID:  [name], [name] 1940-08-03, MRN [id number]  PCP:  Merrilee Seashore, MD  Cardiologist:  Sanda Klein, MD EP: None  Chief Complaint  Patient presents with  . Follow-up    Recent ED visits for pre-syncope      History of Present Illness: [name] is a 79 y.o. male with PMH of SSS s/p PPM, paroxysmal atrial tachycardia (possible AVNRT), HTN, HLD, OSA, CKD stage 3, and gout, who presents for post-ED visit 04/07/19 for pre-syncope.   He was last evaluated by cardiology at an outpatient visit with Dr. Sallyanne Kuster 08/01/2018, at which time he had no cardiac complaints. His device interrogation at that time showed normal function with very frequent episodes of tachycardia, felt to represent junctional tachycardia, though cannot exclude AVNRT. Decision was made to defer AV nodal blocking agents as that may result in increased frequency of V-pacing which was felt to be more harmful than the arrhythmia itself. Last device interrogation 01/2019 showed numerous brief episodes of tachycardia possibly AVNRT vs junctional tachycardia with burden <0.1%.   He was seen in the emergency room 04/07/19 for pre-syncope. He reported lightheadedness after standing up from laying on the couch watching TV. Symptoms lasted a few minutes but less than 10 minutes without reoccurrence. Felt to be orthostatic in nature, recommended to follow-up with cardiology. He again presented to the ED 04/16/19 with complaints of lightheadedness which again was felt to be orthostatic.   He presents today for follow-up of his recent pre-syncope. He describes lightheaded episodes with position changes. Episodes last for about 5 minutes and improves with sitting. He denies chest pain, SOB, palpitations, or syncope with these episodes. We called his wife, [name], to clarify medications he has been taking. His amlodipine (2.5mg  daily) was recently stopped given concerns for  orthostatic hypotension. He has been taking losartan 100mg  daily. He is due for his device interrogation later this month but is unfamiliar with how to send in early reads.     Past Medical History:  Diagnosis Date  . Arthritis    "probably; hands" (04/12/2017)  . High cholesterol   . History of gout   . Hypertension   . OSA (obstructive sleep apnea)    per pt noncompliant CPAP has not use it since 2015  . Presence of permanent cardiac pacemaker 04/12/2017   Dual Chamber  . Wears dentures    upper    Past Surgical History:  Procedure Laterality Date  . CHOLECYSTECTOMY N/A 08/19/2017   Procedure: LAPAROSCOPIC ASSISTED OPEN CHOLECYSTECTOMY WITH INTRAOPERATIVE CHOLANGIOGRAM;  Surgeon: Armandina Gemma, MD;  Location: [facility];  Service: General;  Laterality: N/A;  . ELBOW BURSA SURGERY Left 07-21-2009   extensive bursectomy/ tenosynovectomy/ ulnar nerve release  . EUS N/A 08/18/2017   Procedure: ESOPHAGEAL ENDOSCOPIC ULTRASOUND (EUS) RADIAL;  Surgeon: Milus Banister, MD;  Location: [facility];  Service: Endoscopy;  Laterality: N/A;  . HAMMER TOE SURGERY Right 2015  . HAMMER TOE SURGERY Right 12/11/2014   Procedure: 4th HAMMER TOE CORRECTION, EXCISION LESION 3rd TOE RIGHT FOOT;  Surgeon: Rosemary Holms, DPM;  Location: [facility];  Service: Podiatry;  Laterality: Right;  . INSERT / REPLACE / REMOVE PACEMAKER  04/12/2017   Dual Chamber  . MICROLARYNGOSCOPY  06-07-2006   w/  Excision bilateral vocal cord lesions (bilateral benign nodules)  . PACEMAKER IMPLANT N/A 04/12/2017   Procedure: PACEMAKER IMPLANT - Dual Chamber;  Surgeon: Sanda Klein, MD;  Location: [facility];  Service: Cardiovascular;  Laterality: N/A;     Current Outpatient Medications  Medication Sig Dispense Refill  . aspirin EC 81 MG tablet Take 81 mg by mouth daily.    Marland Kitchen atorvastatin (LIPITOR) 20 MG tablet Take 20 mg by mouth daily.    . Multiple Vitamin (MULTIVITAMIN WITH MINERALS) TABS tablet  Take 1 tablet by mouth daily.    . nitroGLYCERIN (NITROSTAT) 0.4 MG SL tablet Place 1 tablet (0.4 mg total) under the tongue every 5 (five) minutes as needed for chest pain. 25 tablet 11  . potassium chloride SA (KLOR-CON) 20 MEQ tablet Take 20 mEq by mouth daily.    . tamsulosin (FLOMAX) 0.4 MG CAPS capsule Take 0.4 mg by mouth daily.    Marland Kitchen amLODipine (NORVASC) 2.5 MG tablet Take 2.5 mg by mouth daily.     No current facility-administered medications for this visit.    Allergies:   Patient has no known allergies.    Social History:  The patient  reports that he has never smoked. He has never used smokeless tobacco. He reports current alcohol use of about 2.0 standard drinks of alcohol per week. He reports that he does not use drugs.   Family History:  The patient's family history includes Heart attack in his sister; Heart attack (age of onset: 66) in his father; Osteoarthritis in his father; Stroke (age of onset: 39) in his mother.    ROS:  Please see the history of present illness.   Otherwise, review of systems are positive for none.   All other systems are reviewed and negative.    PHYSICAL EXAM: VS:  BP 130/78   Pulse 74   Temp (!) 97.5 F (36.4 C)   Ht 5\' 11"  (1.803 m)   Wt 229 lb 6.4 oz (104.1 kg)   SpO2 96%   BMI 31.99 kg/m  , BMI Body mass index is 31.99 kg/m. GEN: Well nourished, well developed, in no acute distress HEENT: sclera anicteric Neck: no JVD, carotid bruits, or masses Cardiac: RRR; no murmurs, rubs, or gallops, trace LE edema  Respiratory:  clear to auscultation bilaterally, normal work of breathing GI: soft, obese, nontender, nondistended, + BS MS: no deformity or atrophy Skin: warm and dry, no rash Neuro:  Strength and sensation are intact Psych: euthymic mood, full affect   EKG:  EKG is not ordered today.   Recent Labs: 04/16/2019: BUN 25; Creatinine, Ser 1.60; Hemoglobin 13.8; Platelets 189; Potassium 4.2; Sodium 137    Lipid Panel No results  found for: CHOL, TRIG, HDL, CHOLHDL, VLDL, LDLCALC, LDLDIRECT    Wt Readings from Last 3 Encounters:  04/18/19 229 lb 6.4 oz (104.1 kg)  04/16/19 234 lb (106.1 kg)  08/01/18 225 lb (102.1 kg)      Other studies Reviewed: Additional studies/ records that were reviewed today include:   Echocardiogram 12/2016: Study Conclusions   - Left ventricle: The cavity size was normal. Wall thickness was  increased in a pattern of mild LVH. Systolic function was normal.  The estimated ejection fraction was in the range of 50% to 55%.  Wall motion was normal; there were no regional wall motion  abnormalities. Doppler parameters are consistent with abnormal  left ventricular relaxation (grade 1 diastolic dysfunction).  Doppler parameters are consistent with high ventricular filling  pressure.  - Mitral valve: Calcified annulus.   Impressions:   - Normal LV systolic function; mild LVH; mild diastolic  dysfunction.  ASSESSMENT AND PLAN:  1. Pre-syncope: patient describes classic vasovagal symptoms. Orthostatic vitals today: 130/76 (laying), 104/65 (sitting), 112/66 (standing 0 min), and 127/74 (standing 2 min). HR increased appropriately.  - Will message the device clinic for early PPM interrogation  - Will discontinue amlodipine at this time - Will decrease his losartan to 50mg  daily and allow permissive hypertension in the setting of orthostatic hypotension.   2. SSS s/p PPM with intermittent PAT: normal device function with intermittent AVNRT vs junctional tachycardia on last check 01/2019. Due for routine device interrogation later this month.   - Will have him submit a remote interrogation a little early given recurrent presyncope.   3. HTN: BP stable today.  Amlodipine 2.5mg  daily has been on hold. He has continue to take losartan 100mg  daily - Will discontinue amlodipine at this time - Will decrease his losartan to 50mg  daily and allow permissive hypertension in  the setting of orthostatic hypotension.   4. HLD: LDL 73 on last check 09/2018 - Continue atorvastatin 20mg  daily  5. CKD stage 3: Cr 1.6 on labs 04/16/19; up from baseline 1.3 - Continue to monitor routinely  6. OSA: compliant with CPAP - Continue CPAP   Current medicines are reviewed at length with the patient today.  The patient does not have concerns regarding medicines.  The following changes have been made:  As above  Labs/ tests ordered today include: None No orders of the defined types were placed in this encounter.    Disposition:   FU with Dr. Sallyanne Kuster in 4 months  Signed, Abigail Butts, PA-C  04/18/2019 8:34 AM

## 2019-04-16 ENCOUNTER — Encounter (HOSPITAL_COMMUNITY): Payer: Self-pay | Admitting: Emergency Medicine

## 2019-04-16 ENCOUNTER — Other Ambulatory Visit: Payer: Self-pay

## 2019-04-16 ENCOUNTER — Emergency Department (HOSPITAL_COMMUNITY)
Admission: EM | Admit: 2019-04-16 | Discharge: 2019-04-17 | Disposition: A | Discharge status: Home / Self Care | Payer: Medicare Other | Attending: Emergency Medicine | Admitting: Emergency Medicine

## 2019-04-16 DIAGNOSIS — H02403 Unspecified ptosis of bilateral eyelids: Secondary | ICD-10-CM | POA: Diagnosis not present

## 2019-04-16 DIAGNOSIS — R7303 Prediabetes: Secondary | ICD-10-CM | POA: Diagnosis not present

## 2019-04-16 DIAGNOSIS — R55 Syncope and collapse: Secondary | ICD-10-CM | POA: Diagnosis not present

## 2019-04-16 DIAGNOSIS — R110 Nausea: Secondary | ICD-10-CM | POA: Diagnosis not present

## 2019-04-16 DIAGNOSIS — R42 Dizziness and giddiness: Secondary | ICD-10-CM | POA: Diagnosis not present

## 2019-04-16 DIAGNOSIS — Z7982 Long term (current) use of aspirin: Secondary | ICD-10-CM | POA: Diagnosis not present

## 2019-04-16 DIAGNOSIS — Z79899 Other long term (current) drug therapy: Secondary | ICD-10-CM | POA: Insufficient documentation

## 2019-04-16 DIAGNOSIS — I10 Essential (primary) hypertension: Secondary | ICD-10-CM | POA: Diagnosis not present

## 2019-04-16 DIAGNOSIS — Z950 Presence of cardiac pacemaker: Secondary | ICD-10-CM | POA: Insufficient documentation

## 2019-04-16 LAB — BASIC METABOLIC PANEL
Anion gap: 10 (ref 5–15)
BUN: 25 mg/dL — ABNORMAL HIGH (ref 8–23)
CO2: 23 mmol/L (ref 22–32)
Calcium: 9.1 mg/dL (ref 8.9–10.3)
Chloride: 104 mmol/L (ref 98–111)
Creatinine, Ser: 1.6 mg/dL — ABNORMAL HIGH (ref 0.61–1.24)
GFR calc Af Amer: 47 mL/min — ABNORMAL LOW (ref >60)
GFR calc non Af Amer: 41 mL/min — ABNORMAL LOW (ref >60)
Glucose, Bld: 102 mg/dL — ABNORMAL HIGH (ref 70–99)
Potassium: 4.2 mmol/L (ref 3.5–5.1)
Sodium: 137 mmol/L (ref 135–145)

## 2019-04-16 LAB — CBC
HCT: 42 % (ref 39.0–52.0)
Hemoglobin: 13.8 g/dL (ref 13.0–17.0)
MCH: 30.2 pg (ref 26.0–34.0)
MCHC: 32.9 g/dL (ref 30.0–36.0)
MCV: 91.9 fL (ref 80.0–100.0)
Platelets: 189 10*3/uL (ref 150–400)
RBC: 4.57 MIL/uL (ref 4.22–5.81)
RDW: 13.2 % (ref 11.5–15.5)
WBC: 5.4 10*3/uL (ref 4.0–10.5)
nRBC: 0 % (ref 0.0–0.2)

## 2019-04-16 LAB — URINALYSIS, ROUTINE W REFLEX MICROSCOPIC
Bacteria, UA: NONE SEEN
Bilirubin Urine: NEGATIVE
Glucose, UA: NEGATIVE mg/dL
Ketones, ur: NEGATIVE mg/dL
Leukocytes,Ua: NEGATIVE
Nitrite: NEGATIVE
Protein, ur: NEGATIVE mg/dL
Specific Gravity, Urine: 1.009 (ref 1.005–1.030)
pH: 5 (ref 5.0–8.0)

## 2019-04-16 LAB — TROPONIN I (HIGH SENSITIVITY)
Troponin I (High Sensitivity): 10 ng/L (ref <18)
Troponin I (High Sensitivity): 10 ng/L (ref <18)

## 2019-04-16 LAB — CBG MONITORING, ED: Glucose-Capillary: 106 mg/dL — ABNORMAL HIGH (ref 70–99)

## 2019-04-16 MED ORDER — SODIUM CHLORIDE 0.9% FLUSH: 3.0000 mL | Freq: Once | INTRAVENOUS | Status: DC

## 2019-04-16 NOTE — Discharge Instructions (Addendum)
1.  During your evaluation emergency department, there is no sign of heart attack.  You had 2 sets of normal heart enzymes. 2.  Your labs suggest very mild signs of dehydration.  You may take in a little extra fluid.  Do not drink excessive amounts of water.  Be careful to move slowly once you stand.  You should sit in a position for about 30 seconds before standing and then stand for about 30 seconds before trying to walk.  If you feel at all lightheaded or as you might pass out, sit down and elevate your legs.  If symptoms persist or other concerning symptoms develop such as chest pain, shortness of breath then return to the emergency department.

## 2019-04-16 NOTE — ED Provider Notes (Signed)
[facility] DEPT Provider Note   CSN: [id number] Arrival date & time: 04/16/19  1918     History Chief Complaint  Patient presents with  . Nausea  . Dizziness    [name] is a 79 y.o. male.  HPI Patient reports he was seen at his primary care doctor's office today for follow-up after his emergency department visit 1\31 for near syncope.  Reports everything "checked out" at his appointment.  He reports he had not eaten most of the day and then had some dinner at home.  He reports he had been seated for a while and then when he stood up to walk across the room he started to feel just a little woozy.  He reports he did not feel like he was going to pass out.  Reports it also did not feel like things are spinning and there was no headache no chest pain or shortness of breath.  He reports after about 10 minutes the symptoms resolved.  He reports he feels fine now.    Past Medical History:  Diagnosis Date  . Arthritis    "probably; hands" (04/12/2017)  . High cholesterol   . History of gout   . Hypertension   . OSA (obstructive sleep apnea)    per pt noncompliant CPAP has not use it since 2015  . Presence of permanent cardiac pacemaker 04/12/2017   Dual Chamber  . Wears dentures    upper    Patient Active Problem List   Diagnosis Date Noted  . Hypercholesteremia 08/01/2018  . Acute biliary pancreatitis 08/17/2017  . Pacemaker 04/12/2017  . Syncope and collapse 01/23/2017  . Second degree atrioventricular block, Mobitz (type) I 01/23/2017  . SSS (sick sinus syndrome) ([place]) 01/23/2017  . SVT (supraventricular tachycardia) ([place]) 01/23/2017  . Other fatigue 01/23/2017  . Chest pain 12/28/2016  . Essential hypertension 12/28/2016  . BRBPR (bright red blood per rectum) 11/04/2010  . Mild renal insufficiency 11/04/2010  . Sleep apnea 11/04/2010    Past Surgical History:  Procedure Laterality Date  . CHOLECYSTECTOMY N/A 08/19/2017   Procedure: LAPAROSCOPIC ASSISTED OPEN CHOLECYSTECTOMY WITH INTRAOPERATIVE CHOLANGIOGRAM;  Surgeon: Armandina Gemma, MD;  Location: [facility];  Service: General;  Laterality: N/A;  . ELBOW BURSA SURGERY Left 07-21-2009   extensive bursectomy/ tenosynovectomy/ ulnar nerve release  . EUS N/A 08/18/2017   Procedure: ESOPHAGEAL ENDOSCOPIC ULTRASOUND (EUS) RADIAL;  Surgeon: Milus Banister, MD;  Location: [facility];  Service: Endoscopy;  Laterality: N/A;  . HAMMER TOE SURGERY Right 2015  . HAMMER TOE SURGERY Right 12/11/2014   Procedure: 4th HAMMER TOE CORRECTION, EXCISION LESION 3rd TOE RIGHT FOOT;  Surgeon: Rosemary Holms, DPM;  Location: [facility];  Service: Podiatry;  Laterality: Right;  . INSERT / REPLACE / REMOVE PACEMAKER  04/12/2017   Dual Chamber  . MICROLARYNGOSCOPY  06-07-2006   w/  Excision bilateral vocal cord lesions (bilateral benign nodules)  . PACEMAKER IMPLANT N/A 04/12/2017   Procedure: PACEMAKER IMPLANT - Dual Chamber;  Surgeon: Sanda Klein, MD;  Location: [facility];  Service: Cardiovascular;  Laterality: N/A;       Family History  Problem Relation Age of Onset  . Stroke Mother 54       had gangrene of toe, then later "stroke" per patient  . Osteoarthritis Father   . Heart attack Father 49  . Heart attack Sister        sudden cardiac arrest  . Colon cancer Neg Hx  Social History   Tobacco Use  . Smoking status: Never Smoker  . Smokeless tobacco: Never Used  Substance Use Topics  . Alcohol use: Yes    Alcohol/week: 2.0 standard drinks    Types: 2 Standard drinks or equivalent per week  . Drug use: No    Home Medications Prior to Admission medications   Medication Sig Start Date End Date Taking? Authorizing Provider  amLODipine (NORVASC) 2.5 MG tablet Take 2.5 mg by mouth daily. 04/03/19  Yes [provider]  aspirin EC 81 MG tablet Take 81 mg by mouth daily.   Yes [provider]  atorvastatin (LIPITOR) 20 MG  tablet Take 20 mg by mouth daily.   Yes [provider]  Multiple Vitamin (MULTIVITAMIN WITH MINERALS) TABS tablet Take 1 tablet by mouth daily.   Yes [provider]  nitroGLYCERIN (NITROSTAT) 0.4 MG SL tablet Place 1 tablet (0.4 mg total) under the tongue every 5 (five) minutes as needed for chest pain. 08/01/18 06/05/19 Yes Croitoru, Mihai, MD  potassium chloride SA (KLOR-CON) 20 MEQ tablet Take 20 mEq by mouth daily. 12/14/18  Yes [provider]  tamsulosin (FLOMAX) 0.4 MG CAPS capsule Take 0.4 mg by mouth daily. 04/03/19  Yes [provider]    Allergies    Patient has no known allergies.  Review of Systems   Review of Systems 10 Systems reviewed and are negative for acute change except as noted in the HPI. Physical Exam Updated Vital Signs BP (!) 163/95   Pulse 85   Temp 98.1 F (36.7 C) (Oral)   Resp 19   Ht 5\' 11"  (1.803 m)   Wt 106.1 kg   SpO2 99%   BMI 32.64 kg/m   Physical Exam Constitutional:      Appearance: Normal appearance.  HENT:     Head: Normocephalic and atraumatic.  Eyes:     Extraocular Movements: Extraocular movements intact.     Conjunctiva/sclera: Conjunctivae normal.  Cardiovascular:     Rate and Rhythm: Normal rate and regular rhythm.  Pulmonary:     Effort: Pulmonary effort is normal.     Breath sounds: Normal breath sounds.  Abdominal:     General: There is no distension.     Palpations: Abdomen is soft.     Tenderness: There is no abdominal tenderness. There is no guarding.  Musculoskeletal:        General: Normal range of motion.     Comments: Trace to 1+ edema of the lower legs.  Calf soft nontender.  Skin:    General: Skin is warm and dry.  Neurological:     General: No focal deficit present.     Mental Status: He is alert and oriented to person, place, and time.     Cranial Nerves: No cranial nerve deficit.     Motor: No weakness.     Coordination: Coordination normal.  Psychiatric:        Mood  and Affect: Mood normal.     ED Results / Procedures / Treatments   Labs (all labs ordered are listed, but only abnormal results are displayed) Labs Reviewed  BASIC METABOLIC PANEL - Abnormal; Notable for the following components:      Result Value   Glucose, Bld 102 (*)    BUN 25 (*)    Creatinine, Ser 1.60 (*)    GFR calc non Af Amer 41 (*)    GFR calc Af Amer 47 (*)    All other components  within normal limits  URINALYSIS, ROUTINE W REFLEX MICROSCOPIC - Abnormal; Notable for the following components:   Hgb urine dipstick SMALL (*)    All other components within normal limits  CBG MONITORING, ED - Abnormal; Notable for the following components:   Glucose-Capillary 106 (*)    All other components within normal limits  CBC  TROPONIN I (HIGH SENSITIVITY)  TROPONIN I (HIGH SENSITIVITY)    EKG EKG Interpretation  Date/Time:  Tuesday April 16 2019 21:05:41 EST Ventricular Rate:  89 PR Interval:    QRS Duration: 162 QT Interval:  439 QTC Calculation: 535 R Axis:   -79 Text Interpretation: Ventricular-paced rhythm No further analysis attempted due to paced rhythm agree Confirmed by Charlesetta Shanks [phone]) on 04/16/2019 9:22:28 PM   Radiology No results found.  Procedures Procedures (including critical care time)  Medications Ordered in ED Medications  sodium chloride flush (NS) 0.9 % injection 3 mL (0 mLs Intravenous Hold 04/16/19 2110)    ED Course  I have reviewed the triage vital signs and the nursing notes.  Pertinent labs & imaging results that were available during my care of the patient were reviewed by me and considered in my medical decision making (see chart for details).    MDM Rules/Calculators/A&P                      Patient with a self-limited episode of feeling lightheaded.  He denies feeling like he was near syncopal.  No associated chest pain, headache or shortness of breath.  2 sets of cardiac enzymes normal.  EKG is paced.  Patient does not  show signs of infectious etiology.  BUN and creatinine are just mildly above patient's normal baseline.  This may represent slight volume depletion.  Patient may conservatively increase fluid intake.  I do not recommend excessive water consumption.  He is counseled on slowly changing positions from sitting and standing.  I suspect he may have episodes of self-limited orthostatic hypotension with position changes.  Recommendation is to follow-up with PCP. Final Clinical Impression(s) / ED Diagnoses Final diagnoses:  Dizziness    Rx / DC Orders ED Discharge Orders    None       Charlesetta Shanks, MD 04/16/19 2353

## 2019-04-16 NOTE — ED Notes (Signed)
Per EDP Rancour: VO to repeat EKG after pt's HR slows down Triage RN [facility] notified of same

## 2019-04-16 NOTE — ED Notes (Signed)
Patient ambulatory from triage

## 2019-04-16 NOTE — ED Triage Notes (Signed)
Patient here from home reporting one episode of dizziness with nausea today. Has pacemaker.

## 2019-04-18 ENCOUNTER — Ambulatory Visit: Payer: Medicare Other | Admitting: Medical

## 2019-04-18 ENCOUNTER — Encounter: Payer: Self-pay | Admitting: Medical

## 2019-04-18 ENCOUNTER — Other Ambulatory Visit: Payer: Self-pay

## 2019-04-18 VITALS — BP 130/78 | HR 74 | Temp 97.5°F | Ht 71.0 in | Wt 229.4 lb

## 2019-04-18 DIAGNOSIS — Z950 Presence of cardiac pacemaker: Secondary | ICD-10-CM

## 2019-04-18 DIAGNOSIS — R55 Syncope and collapse: Secondary | ICD-10-CM

## 2019-04-18 DIAGNOSIS — G4733 Obstructive sleep apnea (adult) (pediatric): Secondary | ICD-10-CM

## 2019-04-18 DIAGNOSIS — E7800 Pure hypercholesterolemia, unspecified: Secondary | ICD-10-CM

## 2019-04-18 DIAGNOSIS — I10 Essential (primary) hypertension: Secondary | ICD-10-CM

## 2019-04-18 DIAGNOSIS — I495 Sick sinus syndrome: Secondary | ICD-10-CM

## 2019-04-18 MED ORDER — LOSARTAN POTASSIUM 50 MG PO TABS: 50.0000 mg | ORAL_TABLET | Freq: Every day | ORAL | 0 refills | Status: DC

## 2019-04-18 NOTE — Progress Notes (Signed)
Agree, thanks, sounds like he may be a little dry also.

## 2019-04-18 NOTE — Patient Instructions (Addendum)
Medication Instructions:   STOP AMLODIPINE   DECREASE LOSARTAN TO 50 MG DAILY  *If you need a refill on your cardiac medications before your next appointment, please call your pharmacy*  Lab Work: NONE ordered at this time of appointment   If you have labs (blood work) drawn today and your tests are completely normal, you will receive your results only by: Marland Kitchen MyChart Message (if you have MyChart) OR . A paper copy in the mail If you have any lab test that is abnormal or we need to change your treatment, we will call you to review the results.  Testing/Procedures: NONE ordered at this time of appointment   Follow-Up: At [facility], you and your health needs are our priority.  As part of our continuing mission to provide you with exceptional heart care, we have created designated Provider Care Teams.  These Care Teams include your primary Cardiologist (physician) and Advanced Practice Providers (APPs -  Physician Assistants and Nurse Practitioners) who all work together to provide you with the care you need, when you need it.  Your next appointment:   4 month(s)  The format for your next appointment:   In Person  Provider:   Sanda Klein, MD  Other Instructions    Orthostatic Hypotension Blood pressure is a measurement of how strongly, or weakly, your blood is pressing against the walls of your arteries. Orthostatic hypotension is a sudden drop in blood pressure that happens when you quickly change positions, such as when you get up from sitting or lying down. Arteries are blood vessels that carry blood from your heart throughout your body. When blood pressure is too low, you may not get enough blood to your brain or to the rest of your organs. This can cause weakness, light-headedness, rapid heartbeat, and fainting. This can last for just a few seconds or for up to a few minutes. Orthostatic hypotension is usually not a serious problem. However, if it happens frequently or  gets worse, it may be a sign of something more serious. What are the causes? This condition may be caused by:  Sudden changes in posture, such as standing up quickly after you have been sitting or lying down.  Blood loss.  Loss of body fluids (dehydration).  Heart problems.  Hormone (endocrine) problems.  Pregnancy.  Severe infection.  Lack of certain nutrients.  Severe allergic reactions (anaphylaxis).  Certain medicines, such as blood pressure medicine or medicines that make the body lose excess fluids (diuretics). Sometimes, this condition can be caused by not taking medicine as directed, such as taking too much of a certain medicine. What increases the risk? The following factors may make you more likely to develop this condition:  Age. Risk increases as you get older.  Conditions that affect the heart or the central nervous system.  Taking certain medicines, such as blood pressure medicine or diuretics.  Being pregnant. What are the signs or symptoms? Symptoms of this condition may include:  Weakness.  Light-headedness.  Dizziness.  Blurred vision.  Fatigue.  Rapid heartbeat.  Fainting, in severe cases. How is this diagnosed? This condition is diagnosed based on:  Your medical history.  Your symptoms.  Your blood pressure measurement. Your health care provider will check your blood pressure when you are: ? Lying down. ? Sitting. ? Standing. A blood pressure reading is recorded as two numbers, such as "120 over 80" (or 120/80). The first ("top") number is called the systolic pressure. It is a measure of  the pressure in your arteries as your heart beats. The second ("bottom") number is called the diastolic pressure. It is a measure of the pressure in your arteries when your heart relaxes between beats. Blood pressure is measured in a unit called mm Hg. Healthy blood pressure for most adults is 120/80. If your blood pressure is below 90/60, you may be  diagnosed with hypotension. Other information or tests that may be used to diagnose orthostatic hypotension include:  Your other vital signs, such as your heart rate and temperature.  Blood tests.  Tilt table test. For this test, you will be safely secured to a table that moves you from a lying position to an upright position. Your heart rhythm and blood pressure will be monitored during the test. How is this treated? This condition may be treated by:  Changing your diet. This may involve eating more salt (sodium) or drinking more water.  Taking medicines to raise your blood pressure.  Changing the dosage of certain medicines you are taking that might be lowering your blood pressure.  Wearing compression stockings. These stockings help to prevent blood clots and reduce swelling in your legs. In some cases, you may need to go to the hospital for:  Fluid replacement. This means you will receive fluids through an IV.  Blood replacement. This means you will receive donated blood through an IV (transfusion).  Treating an infection or heart problems, if this applies.  Monitoring. You may need to be monitored while medicines that you are taking wear off. Follow these instructions at home: Eating and drinking   Drink enough fluid to keep your urine pale yellow.  Eat a healthy diet, and follow instructions from your health care provider about eating or drinking restrictions. A healthy diet includes: ? Fresh fruits and vegetables. ? Whole grains. ? Lean meats. ? Low-fat dairy products.  Eat extra salt only as directed. Do not add extra salt to your diet unless your health care provider told you to do that.  Eat frequent, small meals.  Avoid standing up suddenly after eating. Medicines  Take over-the-counter and prescription medicines only as told by your health care provider. ? Follow instructions from your health care provider about changing the dosage of your current medicines,  if this applies. ? Do not stop or adjust any of your medicines on your own. General instructions   Wear compression stockings as told by your health care provider.  Get up slowly from lying down or sitting positions. This gives your blood pressure a chance to adjust.  Avoid hot showers and excessive heat as directed by your health care provider.  Return to your normal activities as told by your health care provider. Ask your health care provider what activities are safe for you.  Do not use any products that contain nicotine or tobacco, such as cigarettes, e-cigarettes, and chewing tobacco. If you need help quitting, ask your health care provider.  Keep all follow-up visits as told by your health care provider. This is important. Contact a health care provider if you:  Vomit.  Have diarrhea.  Have a fever for more than 2-3 days.  Feel more thirsty than usual.  Feel weak and tired. Get help right away if you:  Have chest pain.  Have a fast or irregular heartbeat.  Develop numbness in any part of your body.  Cannot move your arms or your legs.  Have trouble speaking.  Become sweaty or feel light-headed.  Faint.  Feel short of  breath.  Have trouble staying awake.  Feel confused. Summary  Orthostatic hypotension is a sudden drop in blood pressure that happens when you quickly change positions.  Orthostatic hypotension is usually not a serious problem.  It is diagnosed by having your blood pressure taken lying down, sitting, and then standing.  It may be treated by changing your diet or adjusting your medicines. This information is not intended to replace advice given to you by your health care provider. Make sure you discuss any questions you have with your health care provider. Document Revised: 08/17/2017 Document Reviewed: 08/17/2017 Elsevier Patient Education  [place].

## 2019-04-19 ENCOUNTER — Telehealth: Payer: Self-pay

## 2019-04-19 NOTE — Telephone Encounter (Signed)
I left a message on both number asking the pt to send a manual transmission with his home remote monitor. I left my direct office number for the pt if he needs help.

## 2019-04-19 NOTE — Telephone Encounter (Signed)
-----   Message from Mechele Dawley, RN sent at 04/18/2019  5:29 PM EST ----- Regarding: RE: remote check Hi Terrah,  We will try to call him tomorrow to assist with a manual transmission. It won't be billable as it's early, so he'll still have to send as scheduled.  Thanks, [name] ----- Message ----- From: Jacqulynn Cadet, CMA Sent: 04/18/2019   9:55 AM EST To: Abigail Butts, PA-C, # Subject: remote check                                   Good morning,  This patient was seen this morning by Roby Lofts, PA-C. The patient informed [name] that he has not been shown how to send in a remote transmission from home. [name] would like to know if the routine remote scheduled for this month can be done a little early.    Thanks,  Ellison Carwin

## 2019-04-23 NOTE — Telephone Encounter (Signed)
I tried to help the pt send a manual transmission but her received the error code [id number]. I conference him with [employer] tech support and we was on hold for almost 15 minutes. I told the pt since he is scheduled for a transmission that is automatic tomorrow we will see if it comes thru. If the transmission do not come thru I will call [employer] tech support and him tomorrow to get help with the monitor.

## 2019-04-24 ENCOUNTER — Ambulatory Visit (INDEPENDENT_AMBULATORY_CARE_PROVIDER_SITE_OTHER): Payer: Medicare Other | Admitting: *Deleted

## 2019-04-24 DIAGNOSIS — I441 Atrioventricular block, second degree: Secondary | ICD-10-CM | POA: Diagnosis not present

## 2019-04-24 LAB — CUP PACEART REMOTE DEVICE CHECK
Battery Remaining Longevity: 150 mo
Battery Voltage: 3.02 V
Brady Statistic AP VP Percent: 22.45 %
Brady Statistic AP VS Percent: 0.23 %
Brady Statistic AS VP Percent: 76.74 %
Brady Statistic AS VS Percent: 0.58 %
Brady Statistic RA Percent Paced: 22.77 %
Brady Statistic RV Percent Paced: 99.18 %
Date Time Interrogation Session: 20210217010947
Implantable Lead Implant Date: 20190206
Implantable Lead Implant Date: 20190206
Implantable Lead Location: 753859
Implantable Lead Location: 753860
Implantable Lead Model: 5076
Implantable Lead Model: 5076
Implantable Pulse Generator Implant Date: 20190206
Lead Channel Impedance Value: 323 Ohm
Lead Channel Impedance Value: 361 Ohm
Lead Channel Impedance Value: 475 Ohm
Lead Channel Impedance Value: 513 Ohm
Lead Channel Pacing Threshold Amplitude: 0.5 V
Lead Channel Pacing Threshold Amplitude: 0.625 V
Lead Channel Pacing Threshold Pulse Width: 0.4 ms
Lead Channel Pacing Threshold Pulse Width: 0.4 ms
Lead Channel Sensing Intrinsic Amplitude: 2.75 mV
Lead Channel Sensing Intrinsic Amplitude: 2.75 mV
Lead Channel Sensing Intrinsic Amplitude: 5.625 mV
Lead Channel Sensing Intrinsic Amplitude: 5.625 mV
Lead Channel Setting Pacing Amplitude: 2 V
Lead Channel Setting Pacing Amplitude: 2.5 V
Lead Channel Setting Pacing Pulse Width: 0.4 ms
Lead Channel Setting Sensing Sensitivity: 0.9 mV

## 2019-04-24 NOTE — Progress Notes (Signed)
PPM remote 

## 2019-04-24 NOTE — Telephone Encounter (Signed)
Returned call to patient he stated he is not dizzy at present.Stated B/P 135/74 pulse 83.Stated he has not decreased Losartan yet to 50 mg daily.Stated he is on his way to pick up new prescription.He already stopped amlodipine.Advised if he continues to have dizziness after taking Losartan 50 mg daily to call back.

## 2019-04-24 NOTE — Telephone Encounter (Signed)
Transmission received automatically on 04/24/19 at 01:09. Normal PPM function. No episodes. Lead trends stable. Full report available for review under "CV Procedures" tab. Will forward information to Roby Lofts, PA-C, as requested.

## 2019-04-24 NOTE — Telephone Encounter (Signed)
I let the pt know we did receive his transmission. I had [name] the device nurse to review it and she states since January 31st there is no new episodes and everything looks good. The pt states he is still having dizzy spells. I asked him was he having one right now and he states No but a few minutes ago he was. He is sitting still and is not having a dizzy spell. I told him I will let the [name] know and someone may give him a call back. The pt verbalized understanding and thanked me for my help.

## 2019-04-24 NOTE — Telephone Encounter (Signed)
Transmission receive

## 2019-04-26 DIAGNOSIS — E782 Mixed hyperlipidemia: Secondary | ICD-10-CM | POA: Diagnosis not present

## 2019-04-26 DIAGNOSIS — R7303 Prediabetes: Secondary | ICD-10-CM | POA: Diagnosis not present

## 2019-04-26 DIAGNOSIS — I10 Essential (primary) hypertension: Secondary | ICD-10-CM | POA: Diagnosis not present

## 2019-05-02 DIAGNOSIS — Z0000 Encounter for general adult medical examination without abnormal findings: Secondary | ICD-10-CM | POA: Diagnosis not present

## 2019-05-02 DIAGNOSIS — R7303 Prediabetes: Secondary | ICD-10-CM | POA: Diagnosis not present

## 2019-05-02 DIAGNOSIS — R0602 Shortness of breath: Secondary | ICD-10-CM | POA: Diagnosis not present

## 2019-05-02 DIAGNOSIS — I10 Essential (primary) hypertension: Secondary | ICD-10-CM | POA: Diagnosis not present

## 2019-05-02 DIAGNOSIS — E782 Mixed hyperlipidemia: Secondary | ICD-10-CM | POA: Diagnosis not present

## 2019-05-08 ENCOUNTER — Other Ambulatory Visit (HOSPITAL_COMMUNITY): Payer: Self-pay | Admitting: Internal Medicine

## 2019-05-08 ENCOUNTER — Other Ambulatory Visit: Payer: Self-pay | Admitting: Internal Medicine

## 2019-05-08 DIAGNOSIS — I739 Peripheral vascular disease, unspecified: Secondary | ICD-10-CM

## 2019-05-08 DIAGNOSIS — R0602 Shortness of breath: Secondary | ICD-10-CM

## 2019-05-09 DIAGNOSIS — R0602 Shortness of breath: Secondary | ICD-10-CM | POA: Diagnosis not present

## 2019-05-09 DIAGNOSIS — H6983 Other specified disorders of Eustachian tube, bilateral: Secondary | ICD-10-CM | POA: Diagnosis not present

## 2019-05-09 DIAGNOSIS — R42 Dizziness and giddiness: Secondary | ICD-10-CM | POA: Diagnosis not present

## 2019-05-14 ENCOUNTER — Ambulatory Visit (HOSPITAL_COMMUNITY)
Admission: RE | Admit: 2019-05-14 | Discharge: 2019-05-14 | Disposition: A | Discharge status: Home / Self Care | Payer: Medicare Other | Source: Ambulatory Visit | Attending: Internal Medicine | Admitting: Internal Medicine

## 2019-05-14 ENCOUNTER — Other Ambulatory Visit: Payer: Self-pay

## 2019-05-14 ENCOUNTER — Other Ambulatory Visit (HOSPITAL_COMMUNITY): Payer: Self-pay | Admitting: Internal Medicine

## 2019-05-14 ENCOUNTER — Ambulatory Visit (HOSPITAL_BASED_OUTPATIENT_CLINIC_OR_DEPARTMENT_OTHER)
Admission: RE | Admit: 2019-05-14 | Discharge: 2019-05-14 | Disposition: A | Discharge status: Home / Self Care | Payer: Medicare Other | Source: Ambulatory Visit

## 2019-05-14 DIAGNOSIS — I700 Atherosclerosis of aorta: Secondary | ICD-10-CM | POA: Insufficient documentation

## 2019-05-14 DIAGNOSIS — I080 Rheumatic disorders of both mitral and aortic valves: Secondary | ICD-10-CM | POA: Insufficient documentation

## 2019-05-14 DIAGNOSIS — R0602 Shortness of breath: Secondary | ICD-10-CM | POA: Diagnosis not present

## 2019-05-14 DIAGNOSIS — I739 Peripheral vascular disease, unspecified: Secondary | ICD-10-CM | POA: Diagnosis not present

## 2019-05-14 DIAGNOSIS — E785 Hyperlipidemia, unspecified: Secondary | ICD-10-CM | POA: Diagnosis not present

## 2019-05-14 DIAGNOSIS — G4730 Sleep apnea, unspecified: Secondary | ICD-10-CM | POA: Diagnosis not present

## 2019-05-14 DIAGNOSIS — I119 Hypertensive heart disease without heart failure: Secondary | ICD-10-CM | POA: Diagnosis not present

## 2019-05-14 DIAGNOSIS — Z950 Presence of cardiac pacemaker: Secondary | ICD-10-CM | POA: Insufficient documentation

## 2019-05-14 DIAGNOSIS — I6523 Occlusion and stenosis of bilateral carotid arteries: Secondary | ICD-10-CM | POA: Insufficient documentation

## 2019-05-14 NOTE — Progress Notes (Signed)
ABI and carotid have been completed.   Preliminary results in CV Proc.   [name] 05/14/2019 10:00 AM

## 2019-05-14 NOTE — Progress Notes (Signed)
  Echocardiogram 2D Echocardiogram has been performed.  [name] 05/14/2019, 11:52 AM

## 2019-05-15 DIAGNOSIS — H2513 Age-related nuclear cataract, bilateral: Secondary | ICD-10-CM | POA: Diagnosis not present

## 2019-05-15 DIAGNOSIS — H01024 Squamous blepharitis left upper eyelid: Secondary | ICD-10-CM | POA: Diagnosis not present

## 2019-05-15 DIAGNOSIS — H40053 Ocular hypertension, bilateral: Secondary | ICD-10-CM | POA: Diagnosis not present

## 2019-05-15 DIAGNOSIS — H01021 Squamous blepharitis right upper eyelid: Secondary | ICD-10-CM | POA: Diagnosis not present

## 2019-05-15 DIAGNOSIS — H16223 Keratoconjunctivitis sicca, not specified as Sjogren's, bilateral: Secondary | ICD-10-CM | POA: Diagnosis not present

## 2019-05-16 ENCOUNTER — Other Ambulatory Visit: Payer: Self-pay | Admitting: Medical

## 2019-05-30 DIAGNOSIS — R1313 Dysphagia, pharyngeal phase: Secondary | ICD-10-CM | POA: Diagnosis not present

## 2019-05-30 DIAGNOSIS — R0602 Shortness of breath: Secondary | ICD-10-CM | POA: Diagnosis not present

## 2019-05-30 DIAGNOSIS — R42 Dizziness and giddiness: Secondary | ICD-10-CM | POA: Diagnosis not present

## 2019-05-30 DIAGNOSIS — E782 Mixed hyperlipidemia: Secondary | ICD-10-CM | POA: Diagnosis not present

## 2019-05-30 DIAGNOSIS — H6983 Other specified disorders of Eustachian tube, bilateral: Secondary | ICD-10-CM | POA: Diagnosis not present

## 2019-06-17 IMAGING — DX DG ABDOMEN 2V
3 series · 3 of 3 positions shown · non-contrast
Comparison: September 23, 2015

CLINICAL DATA: Abdominal pain.

EXAM:
ABDOMEN - 2 VIEW

[abdomen erect]
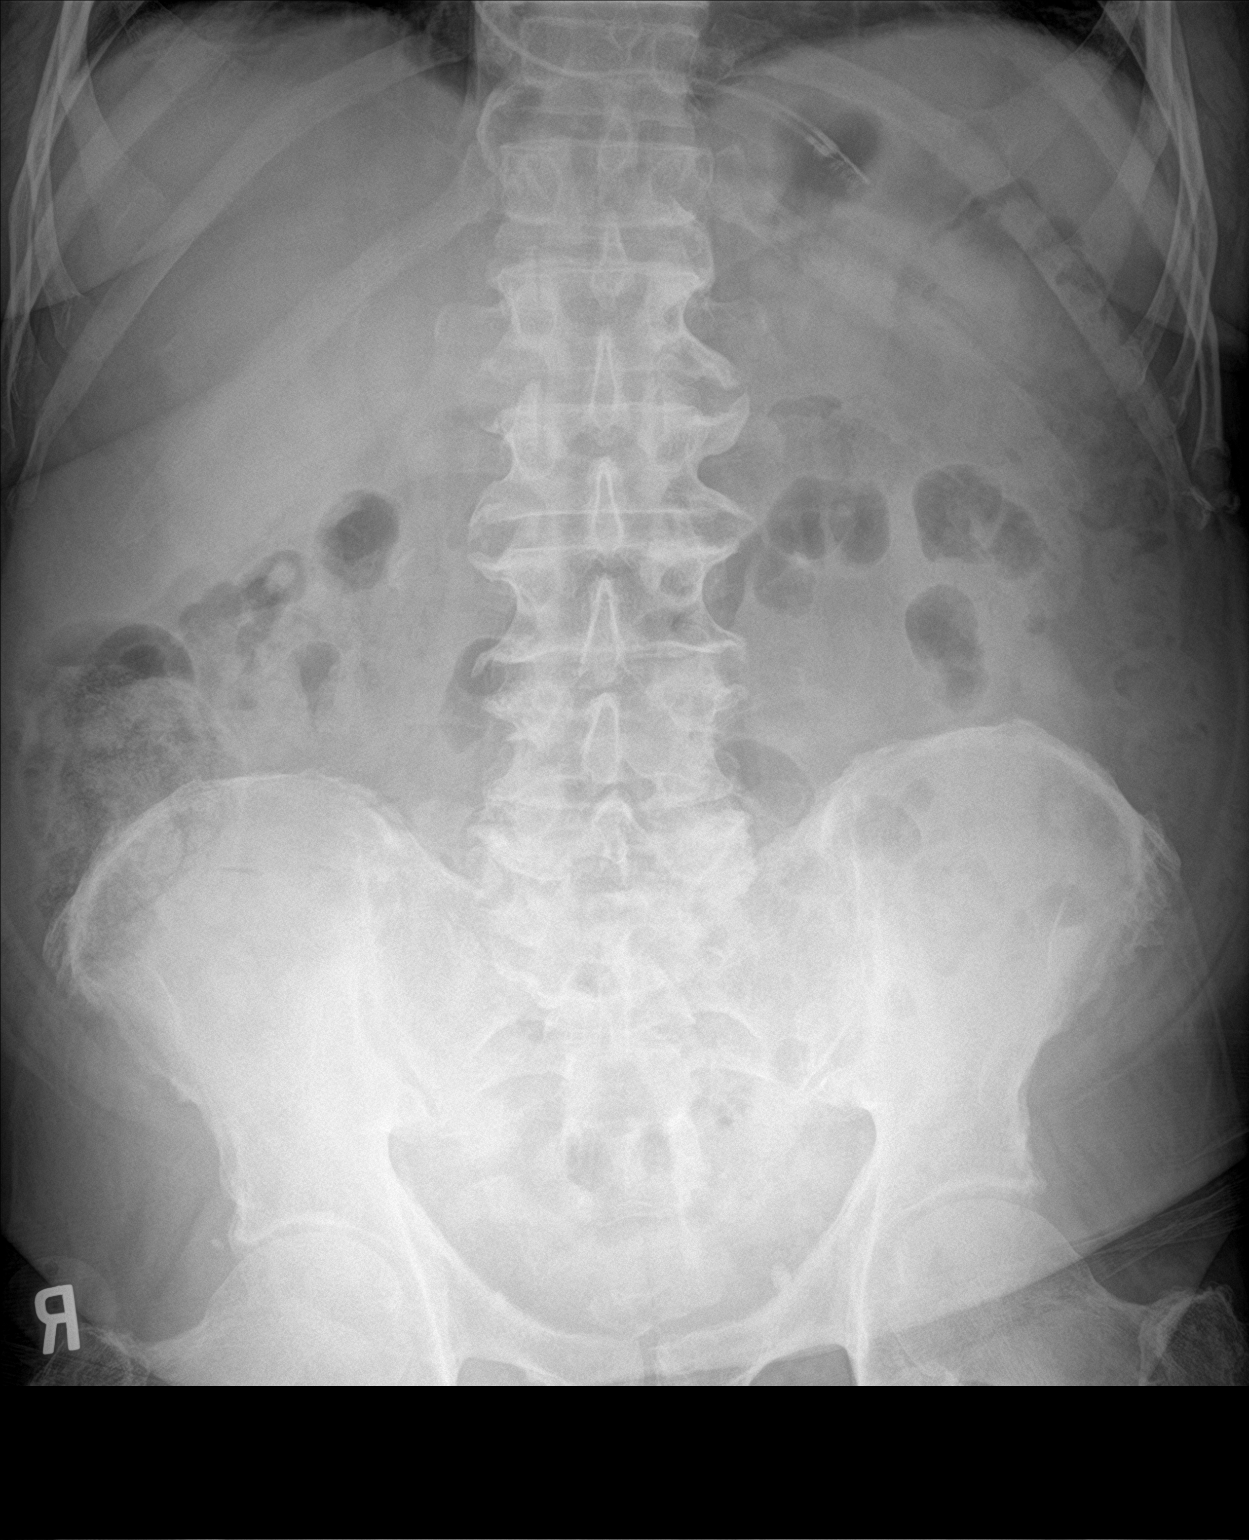

[abdomen supine (1 of 2)]
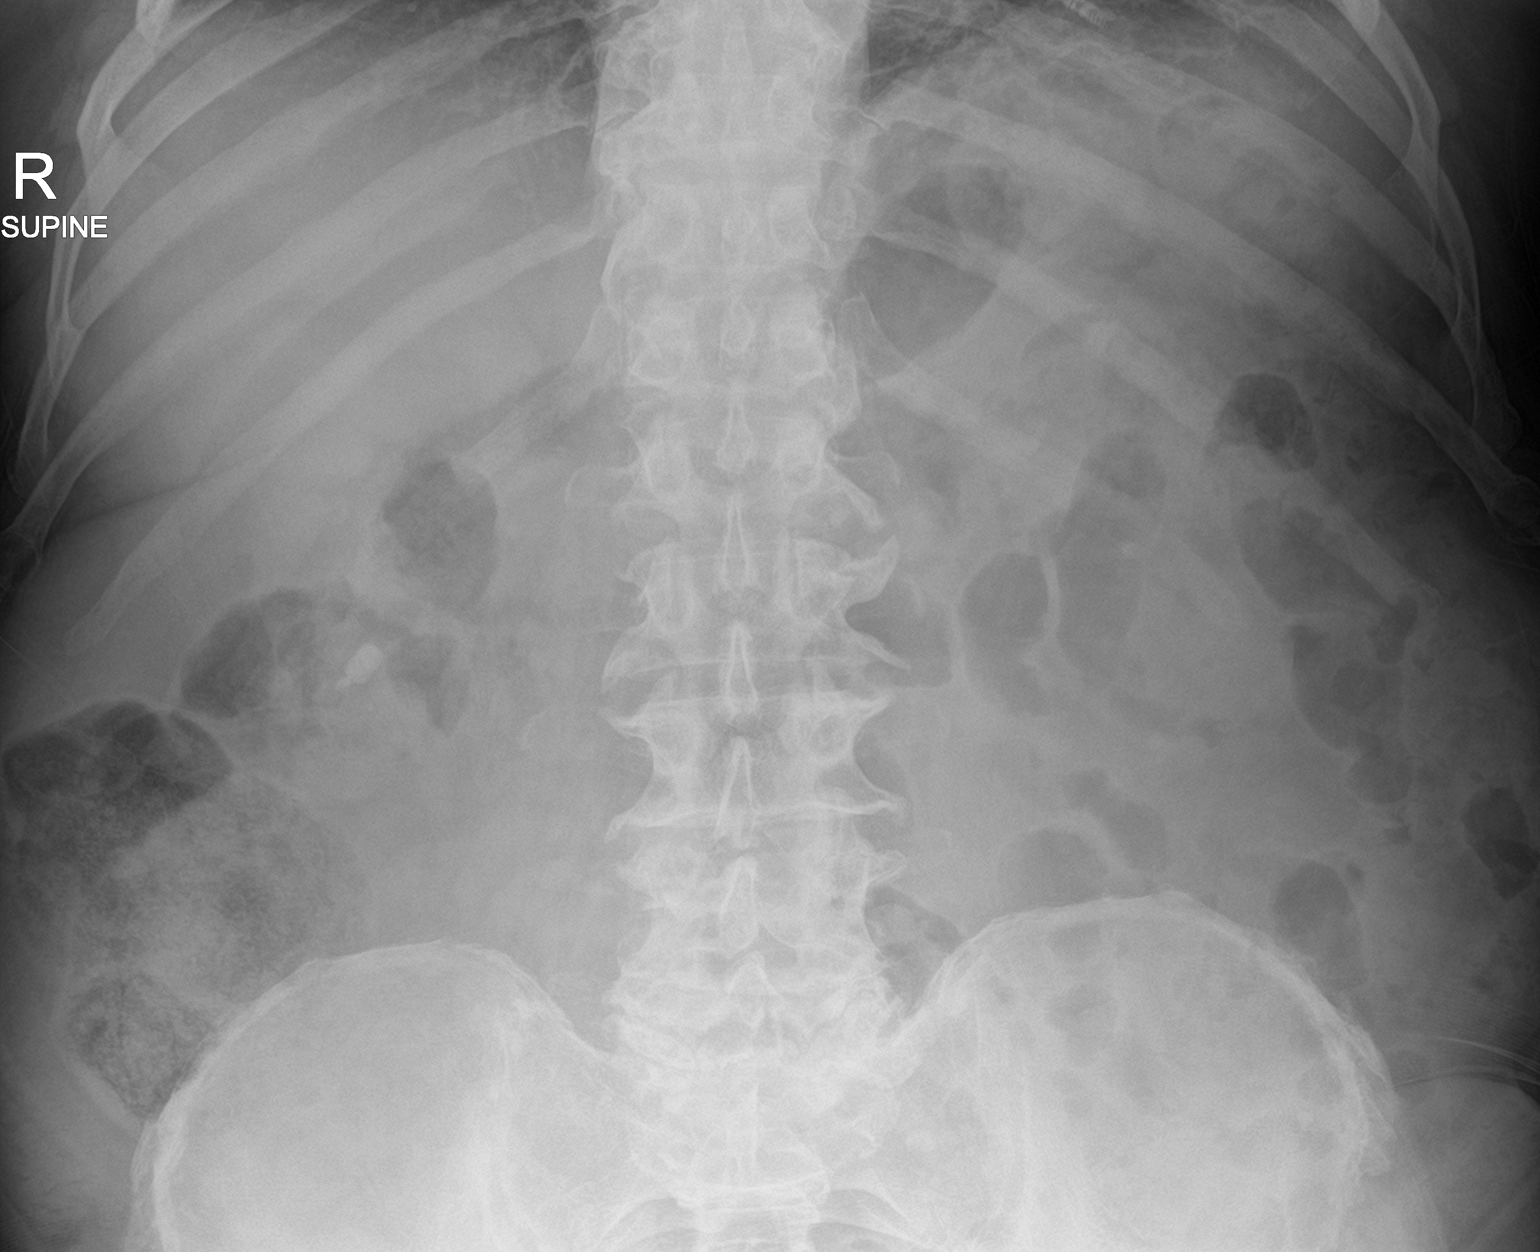

[abdomen supine (2 of 2)]
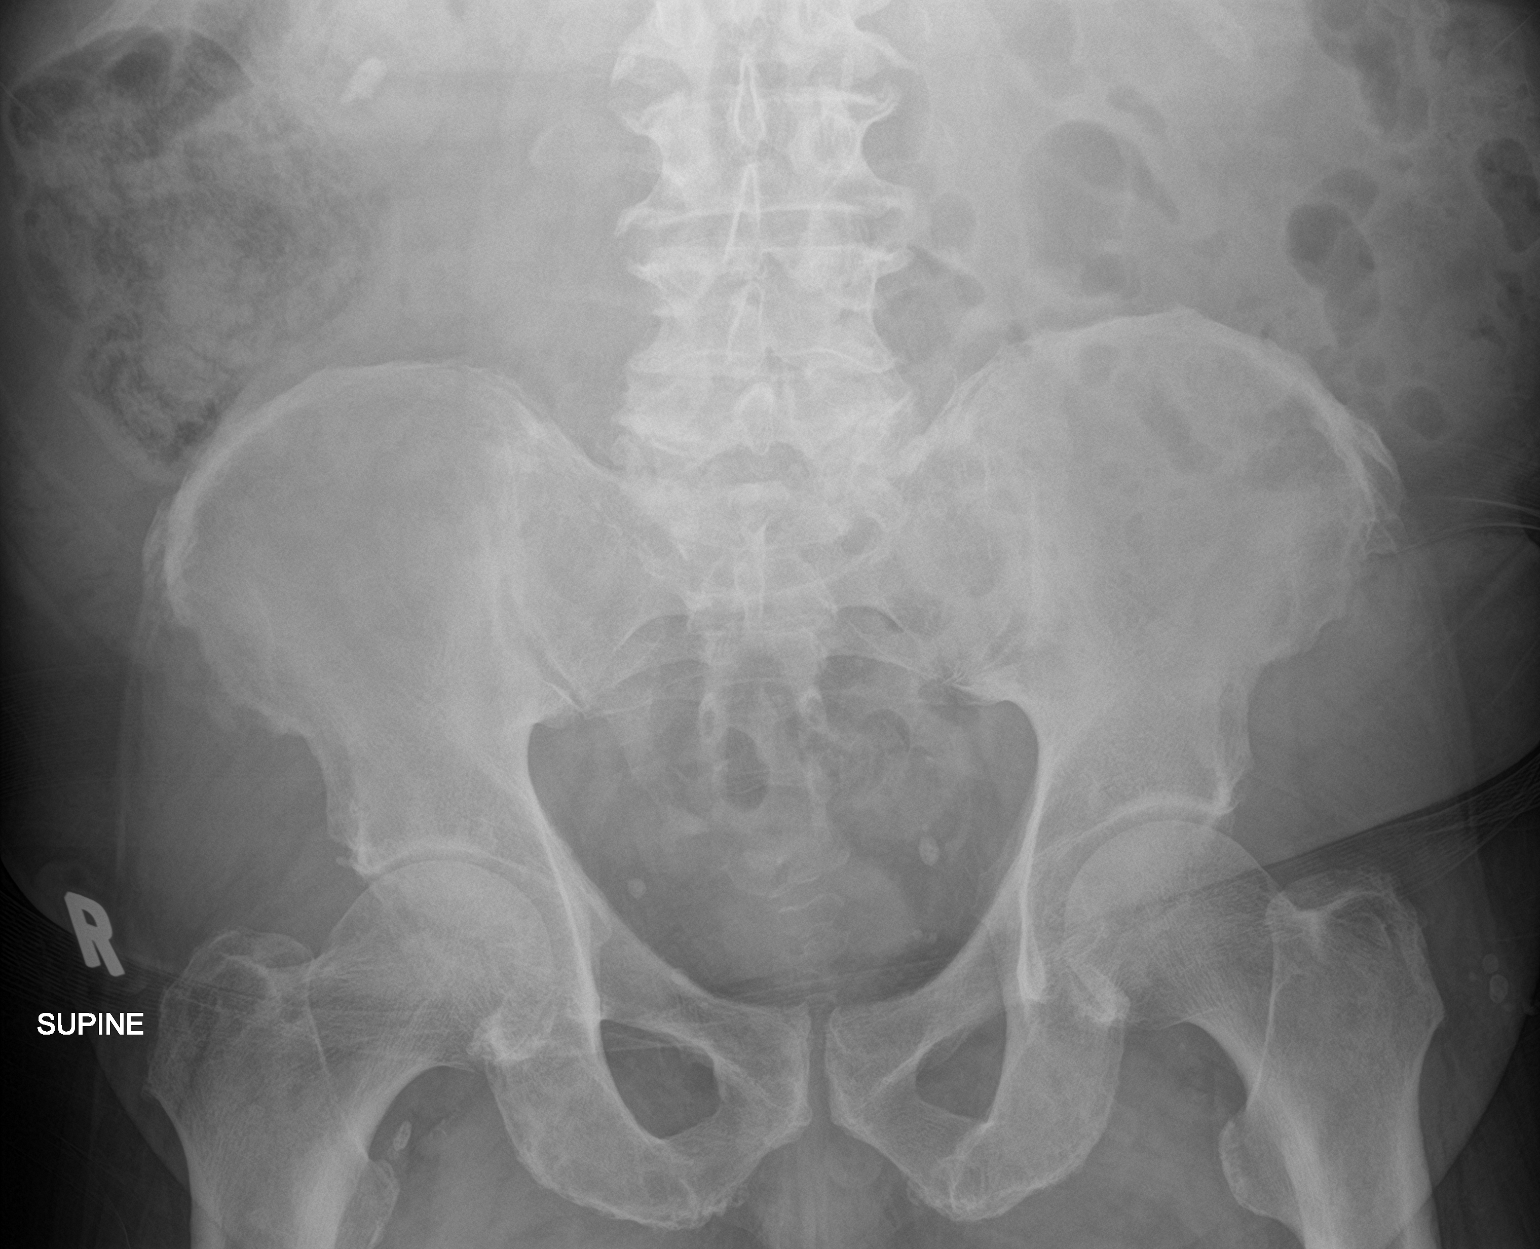

[3 of 3 positions shown; findings below may reference images not displayed]

FINDINGS: Mild fecal loading in the colon. Stones remain in the lower right
kidney. No bowel obstruction. No free air, portal venous gas, or
pneumatosis. No other acute abnormalities.
IMPRESSION: Mild fecal loading in the colon.  Right renal stones.

## 2019-07-22 DIAGNOSIS — L602 Onychogryphosis: Secondary | ICD-10-CM | POA: Diagnosis not present

## 2019-07-22 DIAGNOSIS — M898X7 Other specified disorders of bone, ankle and foot: Secondary | ICD-10-CM | POA: Diagnosis not present

## 2019-07-22 DIAGNOSIS — M792 Neuralgia and neuritis, unspecified: Secondary | ICD-10-CM | POA: Diagnosis not present

## 2019-07-22 DIAGNOSIS — M205X1 Other deformities of toe(s) (acquired), right foot: Secondary | ICD-10-CM | POA: Diagnosis not present

## 2019-07-24 ENCOUNTER — Ambulatory Visit (INDEPENDENT_AMBULATORY_CARE_PROVIDER_SITE_OTHER): Payer: Medicare Other | Admitting: *Deleted

## 2019-07-24 DIAGNOSIS — I495 Sick sinus syndrome: Secondary | ICD-10-CM | POA: Diagnosis not present

## 2019-07-24 LAB — CUP PACEART REMOTE DEVICE CHECK
Battery Remaining Longevity: 143 mo
Battery Voltage: 3.02 V
Brady Statistic AP VP Percent: 18.87 %
Brady Statistic AP VS Percent: 0.03 %
Brady Statistic AS VP Percent: 70.88 %
Brady Statistic AS VS Percent: 10.21 %
Brady Statistic RA Percent Paced: 19.99 %
Brady Statistic RV Percent Paced: 89.76 %
Date Time Interrogation Session: 20210519021501
Implantable Lead Implant Date: 20190206
Implantable Lead Implant Date: 20190206
Implantable Lead Location: 753859
Implantable Lead Location: 753860
Implantable Lead Model: 5076
Implantable Lead Model: 5076
Implantable Pulse Generator Implant Date: 20190206
Lead Channel Impedance Value: 304 Ohm
Lead Channel Impedance Value: 361 Ohm
Lead Channel Impedance Value: 456 Ohm
Lead Channel Impedance Value: 513 Ohm
Lead Channel Pacing Threshold Amplitude: 0.5 V
Lead Channel Pacing Threshold Amplitude: 0.625 V
Lead Channel Pacing Threshold Pulse Width: 0.4 ms
Lead Channel Pacing Threshold Pulse Width: 0.4 ms
Lead Channel Sensing Intrinsic Amplitude: 2.625 mV
Lead Channel Sensing Intrinsic Amplitude: 2.625 mV
Lead Channel Sensing Intrinsic Amplitude: 6 mV
Lead Channel Sensing Intrinsic Amplitude: 6 mV
Lead Channel Setting Pacing Amplitude: 2 V
Lead Channel Setting Pacing Amplitude: 2.5 V
Lead Channel Setting Pacing Pulse Width: 0.4 ms
Lead Channel Setting Sensing Sensitivity: 0.9 mV

## 2019-07-26 NOTE — Progress Notes (Signed)
Remote pacemaker transmission.   

## 2019-08-09 DIAGNOSIS — I872 Venous insufficiency (chronic) (peripheral): Secondary | ICD-10-CM | POA: Diagnosis not present

## 2019-08-09 DIAGNOSIS — L03115 Cellulitis of right lower limb: Secondary | ICD-10-CM | POA: Diagnosis not present

## 2019-08-15 ENCOUNTER — Other Ambulatory Visit: Payer: Self-pay | Admitting: Medical

## 2019-08-15 NOTE — Telephone Encounter (Signed)
This is Dr. Croitoru's pt 

## 2019-09-03 DIAGNOSIS — M792 Neuralgia and neuritis, unspecified: Secondary | ICD-10-CM | POA: Diagnosis not present

## 2019-09-03 DIAGNOSIS — B353 Tinea pedis: Secondary | ICD-10-CM | POA: Diagnosis not present

## 2019-09-03 DIAGNOSIS — R238 Other skin changes: Secondary | ICD-10-CM | POA: Diagnosis not present

## 2019-09-19 DIAGNOSIS — I10 Essential (primary) hypertension: Secondary | ICD-10-CM | POA: Diagnosis not present

## 2019-09-19 DIAGNOSIS — E782 Mixed hyperlipidemia: Secondary | ICD-10-CM | POA: Diagnosis not present

## 2019-09-19 DIAGNOSIS — R7301 Impaired fasting glucose: Secondary | ICD-10-CM | POA: Diagnosis not present

## 2019-09-20 ENCOUNTER — Ambulatory Visit (INDEPENDENT_AMBULATORY_CARE_PROVIDER_SITE_OTHER): Payer: Medicare Other | Admitting: Cardiovascular Disease

## 2019-09-20 ENCOUNTER — Encounter: Payer: Self-pay | Admitting: Cardiovascular Disease

## 2019-09-20 ENCOUNTER — Other Ambulatory Visit: Payer: Self-pay

## 2019-09-20 VITALS — BP 118/66 | HR 87 | Ht 71.0 in | Wt 224.8 lb

## 2019-09-20 DIAGNOSIS — I471 Supraventricular tachycardia: Secondary | ICD-10-CM

## 2019-09-20 DIAGNOSIS — E7800 Pure hypercholesterolemia, unspecified: Secondary | ICD-10-CM

## 2019-09-20 DIAGNOSIS — Z950 Presence of cardiac pacemaker: Secondary | ICD-10-CM

## 2019-09-20 DIAGNOSIS — G4733 Obstructive sleep apnea (adult) (pediatric): Secondary | ICD-10-CM

## 2019-09-20 DIAGNOSIS — I441 Atrioventricular block, second degree: Secondary | ICD-10-CM

## 2019-09-20 DIAGNOSIS — I10 Essential (primary) hypertension: Secondary | ICD-10-CM

## 2019-09-20 NOTE — Progress Notes (Signed)
Cardiology office note   Date:  09/22/2019   ID:  [name], [name] 20-Aug-1940, MRN [id number]   PCP:  Merrilee Seashore, MD  Cardiologist:  Sanda Klein, MD  Electrophysiologist:  None   Evaluation Performed:  Follow-Up Visit  Chief Complaint: Pacemaker follow-up  History of Present Illness:    [name] is a 79 y.o. male with sick sinus syndrome and tachycardia-bradycardia syndrome, paroxysmal atrial tachycardia (possible AVNRT), second-degree atrioventricular block with bradycardia and near syncope for which he received a pacemaker in February 2019.  Additional problems include mild obesity, hyperlipidemia, essential hypertension, obstructive sleep apnea, gout.  He has done quite well since his last appointment and has no cardiovascular complaints.  As before, he rarely has mild swelling that occurs only in the left leg where he had a remote DVT, although ultrasound in August 2019 showed complete resolution of most of the thrombus with the exception of a small organized clot in one of the popliteal veins.  He is still quite active, although he is given up on mowing other peoples lawns, he still does all his own yard work.  His pacemaker shows 7 hours of activity daily.  The patient specifically denies any chest pain at rest exertion, dyspnea at rest or with exertion, orthopnea, paroxysmal nocturnal dyspnea, syncope, palpitations, focal neurological deficits, intermittent claudication, lower extremity edema, unexplained weight gain, cough, hemoptysis or wheezing.  Pacemaker function is normal.  He has a dual-chamber [employer] as your device implanted in 2019 the still has 11.5 years of longevity.  He has 20% atrial pacing and 91% ventricular pacing due to second-degree AV block.  A single 6 beat episode of nonsustained VT has been recorded.  There has been a marked in the frequency of episodes of SVT compared to previous downloads.  Used to have frequent but very brief  events with synchronous atrial and ventricular complexes, suggestive of AV node reentry or junctional tachycardia.  On April 16, 2019 he was seen in the emergency room with complaints of dizziness and tachycardia.  His ECG showed atrial sensed, ventricular paced rhythm.  The P waves were located almost immediately following the paced QRS complex but were not synchronous with the paced QRS complex.  Electrolytes were normal.  Creatinine was 1.6.  Troponin was very low.  Past Medical History:  Diagnosis Date  . Arthritis    "probably; hands" (04/12/2017)  . High cholesterol   . History of gout   . Hypertension   . OSA (obstructive sleep apnea)    per pt noncompliant CPAP has not use it since 2015  . Presence of permanent cardiac pacemaker 04/12/2017   Dual Chamber  . Wears dentures    upper   Past Surgical History:  Procedure Laterality Date  . CHOLECYSTECTOMY N/A 08/19/2017   Procedure: LAPAROSCOPIC ASSISTED OPEN CHOLECYSTECTOMY WITH INTRAOPERATIVE CHOLANGIOGRAM;  Surgeon: Armandina Gemma, MD;  Location: [facility];  Service: General;  Laterality: N/A;  . ELBOW BURSA SURGERY Left 07-21-2009   extensive bursectomy/ tenosynovectomy/ ulnar nerve release  . EUS N/A 08/18/2017   Procedure: ESOPHAGEAL ENDOSCOPIC ULTRASOUND (EUS) RADIAL;  Surgeon: Milus Banister, MD;  Location: [facility];  Service: Endoscopy;  Laterality: N/A;  . HAMMER TOE SURGERY Right 2015  . HAMMER TOE SURGERY Right 12/11/2014   Procedure: 4th HAMMER TOE CORRECTION, EXCISION LESION 3rd TOE RIGHT FOOT;  Surgeon: Rosemary Holms, DPM;  Location: [facility];  Service: Podiatry;  Laterality: Right;  . INSERT / REPLACE / REMOVE PACEMAKER  04/12/2017   Dual Chamber  . MICROLARYNGOSCOPY  06-07-2006   w/  Excision bilateral vocal cord lesions (bilateral benign nodules)  . PACEMAKER IMPLANT N/A 04/12/2017   Procedure: PACEMAKER IMPLANT - Dual Chamber;  Surgeon: Sanda Klein, MD;  Location: [facility];   Service: Cardiovascular;  Laterality: N/A;     Current Meds  Medication Sig  . aspirin EC 81 MG tablet Take 81 mg by mouth daily.  Marland Kitchen atorvastatin (LIPITOR) 20 MG tablet Take 20 mg by mouth daily.  Marland Kitchen losartan (COZAAR) 50 MG tablet TAKE 1 TABLET BY MOUTH EVERY DAY  . Multiple Vitamin (MULTIVITAMIN WITH MINERALS) TABS tablet Take 1 tablet by mouth daily.  . nitroGLYCERIN (NITROSTAT) 0.4 MG SL tablet Place 1 tablet (0.4 mg total) under the tongue every 5 (five) minutes as needed for chest pain.  . potassium chloride SA (KLOR-CON) 20 MEQ tablet Take 20 mEq by mouth daily.  . tamsulosin (FLOMAX) 0.4 MG CAPS capsule Take 0.4 mg by mouth daily.     Allergies:   Patient has no known allergies.   Social History   Tobacco Use  . Smoking status: Never Smoker  . Smokeless tobacco: Never Used  Vaping Use  . Vaping Use: Never used  Substance Use Topics  . Alcohol use: Yes    Alcohol/week: 2.0 standard drinks    Types: 2 Standard drinks or equivalent per week  . Drug use: No     Family Hx: The patient's family history includes Heart attack in his sister; Heart attack (age of onset: 51) in his father; Osteoarthritis in his father; Stroke (age of onset: 1) in his mother. There is no history of Colon cancer.  ROS:   Please see the history of present illness.   All other systems are reviewed and are negative.   Prior CV studies:   The following studies were reviewed today:  Comprehensive pacemaker check performed today  Labs/Other Tests and Data Reviewed:    EKG: April 17 2019 ECG shows atrial sensed, ventricular paced rhythm with long AV delay at a rate of 108 bpm.  Recent Labs: 04/16/2019: BUN 25; Creatinine, Ser 1.60; Hemoglobin 13.8; Platelets 189; Potassium 4.2; Sodium 137  04/26/2019 TSH 2.25, creat 1.31  Recent Lipid Panel 04/26/2019 total cholesterol 126, HDL 52, LDL 74, triglycerides 43.  Wt Readings from Last 3 Encounters:  09/20/19 224 lb 12.8 oz (102 kg)  04/18/19  229 lb 6.4 oz (104.1 kg)  04/16/19 234 lb (106.1 kg)     Objective:    Vital Signs:  BP 118/66   Pulse 87   Ht 5\' 11"  (1.803 m)   Wt 224 lb 12.8 oz (102 kg)   SpO2 98%   BMI 31.35 kg/m    General: Alert, oriented x3, no distress, healthy left subclavian pacemaker site, mildly obese Head: no evidence of trauma, PERRL, EOMI, no exophtalmos or lid lag, no myxedema, no xanthelasma; normal ears, nose and oropharynx Neck: normal jugular venous pulsations and no hepatojugular reflux; brisk carotid pulses without delay and no carotid bruits Chest: clear to auscultation, no signs of consolidation by percussion or palpation, normal fremitus, symmetrical and full respiratory excursions Cardiovascular: normal position and quality of the apical impulse, regular rhythm, normal first and second heart sounds, no murmurs, rubs or gallops Abdomen: no tenderness or distention, no masses by palpation, no abnormal pulsatility or arterial bruits, normal bowel sounds, no hepatosplenomegaly Extremities: no clubbing, cyanosis or edema; 2+ radial, ulnar and brachial pulses bilaterally; 2+ right  femoral, posterior tibial and dorsalis pedis pulses; 2+ left femoral, posterior tibial and dorsalis pedis pulses; no subclavian or femoral bruits Neurological: grossly nonfocal Psych: Normal mood and affect   ASSESSMENT & PLAN:    1. PAT (paroxysmal atrial tachycardia) ([place])   2. Second degree atrioventricular block, Mobitz (type) I   3. Pacemaker   4. Essential hypertension   5. OSA (obstructive sleep apnea)   6. Hypercholesterolemia       1. PAT: I wonder if his episode of dizziness in February was due to junctional tachycardia.  He also had some evidence that he might have been a little bit dehydrated/orthostatic hypotension.  There is been a remarkable reduction in the burden of atrial arrhythmia since then and he has been asymptomatic.  At this point he is pacing the ventricle 90% of the time anyway and if we  decide to use AV nodal blocking agents it would not make a big difference.  2. 2nd deg AVB, MT1: He has been asymptomatic since pacemaker implantation.  No recent episodes of near syncope. 3. PM: Normal device function, continue remote downloads every 3 months and yearly office visits. 4. HTN: well controlled. 5. OSA: reports consistent use of CPAP. Encouraged to lose weight. 6. HLP: Labs checked most recently by Dr. Ashby Dawes showing total cholesterol 126, HDL 52, LDL 74, triglycerides 43.  COVID-19 Education: The signs and symptoms of COVID-19 were discussed with the patient and how to seek care for testing (follow up with PCP or arrange E-visit).  The importance of social distancing was discussed today.  Time:   Today, I have spent 26 minutes with the patient with telehealth technology discussing the above problems.     Medication Adjustments/Labs and Tests Ordered: Current medicines are reviewed at length with the patient today.  Concerns regarding medicines are outlined above.   Tests Ordered: No orders of the defined types were placed in this encounter.   Medication Changes: No orders of the defined types were placed in this encounter.  Patient Instructions  Medication Instructions:  No change *If you need a refill on your cardiac medications before your next appointment, please call your pharmacy*   Lab Work: None ordered If you have labs (blood work) drawn today and your tests are completely normal, you will receive your results only by: Marland Kitchen MyChart Message (if you have MyChart) OR . A paper copy in the mail If you have any lab test that is abnormal or we need to change your treatment, we will call you to review the results.   Testing/Procedures: None ordered   Follow-Up: At [facility] you and your health needs are our priority.  As part of our continuing mission to provide you with exceptional heart care, we have created designated Provider Care Teams.  These  Care Teams include your primary Cardiologist (physician) and Advanced Practice Providers (APPs -  Physician Assistants and Nurse Practitioners) who all work together to provide you with the care you need, when you need it.  We recommend signing up for the patient portal called "MyChart".  Sign up information is provided on this After Visit Summary.  MyChart is used to connect with patients for Virtual Visits (Telemedicine).  Patients are able to view lab/test results, encounter notes, upcoming appointments, etc.  Non-urgent messages can be sent to your provider as well.   To learn more about what you can do with MyChart, go to [email or web address].    Your next appointment:   12 month(s)  The  format for your next appointment:   In Person  Provider:   Sanda Klein, MD      Disposition:  Follow up 12 months  Signed, Sanda Klein, MD  09/22/2019 3:26 PM    [place]

## 2019-09-20 NOTE — Patient Instructions (Signed)
Medication Instructions:  No change *If you need a refill on your cardiac medications before your next appointment, please call your pharmacy*   Lab Work: None ordered If you have labs (blood work) drawn today and your tests are completely normal, you will receive your results only by: . MyChart Message (if you have MyChart) OR . A paper copy in the mail If you have any lab test that is abnormal or we need to change your treatment, we will call you to review the results.   Testing/Procedures: None ordered   Follow-Up: At [facility], you and your health needs are our priority.  As part of our continuing mission to provide you with exceptional heart care, we have created designated Provider Care Teams.  These Care Teams include your primary Cardiologist (physician) and Advanced Practice Providers (APPs -  Physician Assistants and Nurse Practitioners) who all work together to provide you with the care you need, when you need it.  We recommend signing up for the patient portal called "MyChart".  Sign up information is provided on this After Visit Summary.  MyChart is used to connect with patients for Virtual Visits (Telemedicine).  Patients are able to view lab/test results, encounter notes, upcoming appointments, etc.  Non-urgent messages can be sent to your provider as well.   To learn more about what you can do with MyChart, go to [email or web address].    Your next appointment:   12 month(s)  The format for your next appointment:   In Person  Provider:   Mihai Croitoru, MD   

## 2019-09-22 ENCOUNTER — Encounter: Payer: Self-pay | Admitting: Cardiovascular Disease

## 2019-09-26 DIAGNOSIS — R7303 Prediabetes: Secondary | ICD-10-CM | POA: Diagnosis not present

## 2019-09-26 DIAGNOSIS — I10 Essential (primary) hypertension: Secondary | ICD-10-CM | POA: Diagnosis not present

## 2019-09-26 DIAGNOSIS — M1000 Idiopathic gout, unspecified site: Secondary | ICD-10-CM | POA: Diagnosis not present

## 2019-09-26 DIAGNOSIS — I739 Peripheral vascular disease, unspecified: Secondary | ICD-10-CM | POA: Diagnosis not present

## 2019-09-26 DIAGNOSIS — E782 Mixed hyperlipidemia: Secondary | ICD-10-CM | POA: Diagnosis not present

## 2019-10-02 DIAGNOSIS — M792 Neuralgia and neuritis, unspecified: Secondary | ICD-10-CM | POA: Diagnosis not present

## 2019-10-02 DIAGNOSIS — B353 Tinea pedis: Secondary | ICD-10-CM | POA: Diagnosis not present

## 2019-10-02 DIAGNOSIS — R238 Other skin changes: Secondary | ICD-10-CM | POA: Diagnosis not present

## 2019-10-02 DIAGNOSIS — S90829A Blister (nonthermal), unspecified foot, initial encounter: Secondary | ICD-10-CM | POA: Diagnosis not present

## 2019-10-10 DIAGNOSIS — S90821A Blister (nonthermal), right foot, initial encounter: Secondary | ICD-10-CM | POA: Diagnosis not present

## 2019-10-23 ENCOUNTER — Telehealth: Payer: Self-pay

## 2019-10-23 ENCOUNTER — Ambulatory Visit (INDEPENDENT_AMBULATORY_CARE_PROVIDER_SITE_OTHER): Payer: Medicare Other | Admitting: *Deleted

## 2019-10-23 DIAGNOSIS — B353 Tinea pedis: Secondary | ICD-10-CM | POA: Diagnosis not present

## 2019-10-23 DIAGNOSIS — M792 Neuralgia and neuritis, unspecified: Secondary | ICD-10-CM | POA: Diagnosis not present

## 2019-10-23 DIAGNOSIS — I495 Sick sinus syndrome: Secondary | ICD-10-CM | POA: Diagnosis not present

## 2019-10-23 DIAGNOSIS — R238 Other skin changes: Secondary | ICD-10-CM | POA: Diagnosis not present

## 2019-10-23 LAB — CUP PACEART REMOTE DEVICE CHECK
Battery Remaining Longevity: 134 mo
Battery Voltage: 3.01 V
Brady Statistic AP VP Percent: 27.39 %
Brady Statistic AP VS Percent: 0.08 %
Brady Statistic AS VP Percent: 62.61 %
Brady Statistic AS VS Percent: 9.92 %
Brady Statistic RA Percent Paced: 27.99 %
Brady Statistic RV Percent Paced: 90 %
Date Time Interrogation Session: 20210818021549
Implantable Lead Implant Date: 20190206
Implantable Lead Implant Date: 20190206
Implantable Lead Location: 753859
Implantable Lead Location: 753860
Implantable Lead Model: 5076
Implantable Lead Model: 5076
Implantable Pulse Generator Implant Date: 20190206
Lead Channel Impedance Value: 304 Ohm
Lead Channel Impedance Value: 342 Ohm
Lead Channel Impedance Value: 418 Ohm
Lead Channel Impedance Value: 456 Ohm
Lead Channel Pacing Threshold Amplitude: 0.625 V
Lead Channel Pacing Threshold Amplitude: 0.625 V
Lead Channel Pacing Threshold Pulse Width: 0.4 ms
Lead Channel Pacing Threshold Pulse Width: 0.4 ms
Lead Channel Sensing Intrinsic Amplitude: 2.875 mV
Lead Channel Sensing Intrinsic Amplitude: 2.875 mV
Lead Channel Sensing Intrinsic Amplitude: 6.875 mV
Lead Channel Sensing Intrinsic Amplitude: 6.875 mV
Lead Channel Setting Pacing Amplitude: 2 V
Lead Channel Setting Pacing Amplitude: 2.5 V
Lead Channel Setting Pacing Pulse Width: 0.4 ms
Lead Channel Setting Sensing Sensitivity: 0.9 mV

## 2019-10-23 NOTE — Telephone Encounter (Signed)
Carelink scheduled remote received 8/18/2.  11 VT and 39 Fast AV, EGM's appear AVNRT 150-160's. Per Dr. Sallyanne Kuster note on 09/20/19, posible AVNRT? Questionable hx of junctional tachcardia d/t dizziness per Dr. Sallyanne Kuster note.   Called patient to assess, no answer. LMOVM w/ direct number.

## 2019-10-24 NOTE — Telephone Encounter (Signed)
Pt returned call. Reports he may have had some dizziness last week, but he isn't sure. Denies any syncopal episodes. States he is feeling great overall. Reports compliance with cardiac meds. Advised will forward to Dr. Sallyanne Kuster for recommendations. Pt in agreement with plan, denies additional questions at this time.

## 2019-10-25 NOTE — Progress Notes (Signed)
Remote pacemaker transmission.   

## 2019-10-28 NOTE — Telephone Encounter (Signed)
Lattie Haw, please have him start metoprolol succinate 25 mg once daily. [name], could we possibly get him in for an EP consultation (maybe Dr. Quentin Ore has an opening for new patient?). Thank you!

## 2019-10-29 MED ORDER — METOPROLOL SUCCINATE ER 25 MG PO TB24: 25.0000 mg | ORAL_TABLET | Freq: Every day | ORAL | 1 refills | Status: DC

## 2019-10-29 NOTE — Telephone Encounter (Signed)
The patient has been made aware of the reason to start the Metoprolol Succinate and has been educated on this. He has been advised that some one will be reaching out to him to set up the EP consult as well.  The Succinate 25mg  once daily has been sent in for him.

## 2019-10-30 ENCOUNTER — Telehealth: Payer: Self-pay | Admitting: Cardiovascular Disease

## 2019-10-30 NOTE — Telephone Encounter (Signed)
Spoke to patient he stated he wanted to make sure he needed to continue taking aspirin.Advised he needs to keep taking aspirin 81 mg daily.

## 2019-10-30 NOTE — Telephone Encounter (Signed)
[name] is calling requesting a nurse call him to discuss his medications. Please advise.

## 2019-10-31 ENCOUNTER — Telehealth: Payer: Self-pay | Admitting: Cardiovascular Disease

## 2019-10-31 NOTE — Telephone Encounter (Signed)
Patient is returning someone's call about orders. Please call back.

## 2019-10-31 NOTE — Telephone Encounter (Signed)
Left message to call back  --unsure who called or what this was in regards to

## 2019-11-11 NOTE — Progress Notes (Signed)
Electrophysiology Office Note:    Date:  11/12/2019   ID:  [name], DOB 12-07-1940, MRN [id number]  PCP:  Merrilee Seashore, MD  [facility] Cardiologist:  Sanda Klein, MD  [facility] Electrophysiologist:  Vickie Epley, MD   Referring MD: Merrilee Seashore, MD   Chief Complaint: SVT on PPM interrogation  History of Present Illness:    [name] is a 79 y.o. male with a hx of tachycardia-bradycardia syndrome, second-degree AV block status post dual-chamber permanent pacemaker in February 2019 presents to the clinic for an evaluation of paroxysmal SVT observed on a recent pacemaker interrogation at the request of Dr. Sallyanne Kuster.  Additional medical problems include obesity, hyperlipidemia, hypertension, obstructive sleep apnea, gout, deep vein thrombosis.  He tells me that he intermittently has symptoms of racing heart while he works outdoors. 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   Past Medical History:  Diagnosis Date   Arthritis    "probably; hands" (04/12/2017)   High cholesterol    History of gout    Hypertension    OSA (obstructive sleep apnea)    per pt noncompliant CPAP has not use it since 2015   Presence of permanent cardiac pacemaker 04/12/2017   Dual Chamber   Wears dentures    upper    Past Surgical History:  Procedure Laterality Date   CHOLECYSTECTOMY N/A 08/19/2017   Procedure: LAPAROSCOPIC ASSISTED OPEN CHOLECYSTECTOMY WITH INTRAOPERATIVE CHOLANGIOGRAM;  Surgeon: Armandina Gemma, MD;  Location: [facility];  Service: General;  Laterality: N/A;   ELBOW BURSA SURGERY Left 07-21-2009   extensive bursectomy/ tenosynovectomy/ ulnar nerve release   EUS N/A 08/18/2017   Procedure: ESOPHAGEAL ENDOSCOPIC ULTRASOUND (EUS) RADIAL;  Surgeon: Milus Banister, MD;  Location: [facility];  Service: Endoscopy;  Laterality: N/A;   HAMMER TOE SURGERY Right 2015   HAMMER TOE SURGERY Right 12/11/2014   Procedure: 4th  HAMMER TOE CORRECTION, EXCISION LESION 3rd TOE RIGHT FOOT;  Surgeon: Rosemary Holms, DPM;  Location: [place];  Service: Podiatry;  Laterality: Right;   INSERT / REPLACE / REMOVE PACEMAKER  04/12/2017   Dual Chamber   MICROLARYNGOSCOPY  06-07-2006   w/  Excision bilateral vocal cord lesions (bilateral benign nodules)   PACEMAKER IMPLANT N/A 04/12/2017   Procedure: PACEMAKER IMPLANT - Dual Chamber;  Surgeon: Sanda Klein, MD;  Location: [facility];  Service: Cardiovascular;  Laterality: N/A;    Current Medications: Current Meds  Medication Sig   aspirin EC 81 MG tablet Take 81 mg by mouth daily.   atorvastatin (LIPITOR) 20 MG tablet Take 20 mg by mouth daily.   fluticasone (FLONASE) 50 MCG/ACT nasal spray Place 1 spray into both nostrils daily.   losartan (COZAAR) 50 MG tablet TAKE 1 TABLET BY MOUTH EVERY DAY   metoprolol succinate (TOPROL-XL) 25 MG 24 hr tablet Take 1 tablet (25 mg total) by mouth daily.   Multiple Vitamin (MULTIVITAMIN WITH MINERALS) TABS tablet Take 1 tablet by mouth daily.   potassium chloride SA (KLOR-CON) 20 MEQ tablet Take 20 mEq by mouth daily.   tamsulosin (FLOMAX) 0.4 MG CAPS capsule Take 0.4 mg by mouth daily.     Allergies:   Patient has no known allergies.   Social History   Socioeconomic History   Marital status: Married    Spouse name: Not on file   Number of children: Not on file   Years of education: Not on file   Highest education  level: Not on file  Occupational History   Not on file  Tobacco Use   Smoking status: Never Smoker   Smokeless tobacco: Never Used  Vaping Use   Vaping Use: Never used  Substance and Sexual Activity   Alcohol use: Yes    Alcohol/week: 2.0 standard drinks    Types: 2 Standard drinks or equivalent per week   Drug use: No   Sexual activity: Yes  Other Topics Concern   Not on file  Social History Narrative   Not on file   Social Determinants of Health    Financial Resource Strain:    Difficulty of Paying Living Expenses: Not on file  Food Insecurity:    Worried About [place] in the Last Year: Not on file   Ran Out of Food in the Last Year: Not on file  Transportation Needs:    Lack of Transportation (Medical): Not on file   Lack of Transportation (Non-Medical): Not on file  Physical Activity:    Days of Exercise per Week: Not on file   Minutes of Exercise per Session: Not on file  Stress:    Feeling of Stress : Not on file  Social Connections:    Frequency of Communication with Friends and Family: Not on file   Frequency of Social Gatherings with Friends and Family: Not on file   Attends Religious Services: Not on file   Active Member of Clubs or Organizations: Not on file   Attends Archivist Meetings: Not on file   Marital Status: Not on file     Family History: The patient's family history includes Heart attack in his sister; Heart attack (age of onset: 61) in his father; Osteoarthritis in his father; Stroke (age of onset: 53) in his mother. There is no history of Colon cancer.  ROS:   Please see the history of present illness.    All other systems reviewed and are negative.  EKGs/Labs/Other Studies Reviewed:    The following studies were reviewed today: PPM interrogation   10/23/2019 PPM Interrogation Personally reviewed by me.  In the below episode there is in a sensed V sensed rhythm with a stable PR interval.  On the second row there is a prolongation in the PR interval at the onset of a tachycardia with simultaneous a and V activations.  This pattern is consistent with dual antegrade AV nodal physiology and AVNRT.  In this episode and a subsequent episode of a on the tachycardia, a premature atrial contraction terminates the tachycardia.  The tachycardia cycle length is approximately 411ms.    05/14/2019 Echo 1. Apical and apical septal hypokinesis.. Left ventricular ejection   fraction, by estimation, is 50 to 55%. The left ventricle has low normal  function. The left ventricle has no regional wall motion abnormalities.  Left ventricular diastolic parameters  are consistent with Grade I diastolic dysfunction (impaired relaxation).  Elevated left ventricular end-diastolic pressure.  2. Right ventricular systolic function is normal. The right ventricular  size is normal. There is normal pulmonary artery systolic pressure.  3. Left atrial size was moderately dilated.  4. The mitral valve is normal in structure. Trivial mitral valve  regurgitation. No evidence of mitral stenosis.  5. The aortic valve is tricuspid. Aortic valve regurgitation is not  visualized. No aortic stenosis is present.  6. The inferior vena cava is normal in size with greater than 50%  respiratory variability, suggesting right atrial pressure of 3 mmHg.    EKG:  The ekg ordered today demonstrates a-v sequential pacing.  Recent Labs: 04/16/2019: BUN 25; Creatinine, Ser 1.60; Hemoglobin 13.8; Platelets 189; Potassium 4.2; Sodium 137  Recent Lipid Panel No results found for: CHOL, TRIG, HDL, CHOLHDL, VLDL, LDLCALC, LDLDIRECT  Physical Exam:    VS:  BP (!) 146/80    Pulse 61    Ht 5\' 11"  (1.803 m)    Wt 231 lb 12.8 oz (105.1 kg)    SpO2 95%    BMI 32.33 kg/m     Wt Readings from Last 3 Encounters:  11/12/19 231 lb 12.8 oz (105.1 kg)  09/20/19 224 lb 12.8 oz (102 kg)  04/18/19 229 lb 6.4 oz (104.1 kg)     GEN:  Well nourished, well developed in no acute distress HEENT: Normal NECK: No JVD; No carotid bruits LYMPHATICS: No lymphadenopathy CARDIAC: RRR, no murmurs, rubs, gallops RESPIRATORY:  Clear to auscultation without rales, wheezing or rhonchi  ABDOMEN: Soft, non-tender, non-distended MUSCULOSKELETAL:  No edema; No deformity  SKIN: Warm and dry NEUROLOGIC:  Alert and oriented x 3 PSYCHIATRIC:  Normal affect   ASSESSMENT:    1. SVT (supraventricular tachycardia) ([place])    2. SSS (sick sinus syndrome) ([place])   3. Pacemaker    PLAN:    In order of problems listed above:  1. SVT Patient has symptomatic SVT on his pacemaker interrogations consistent with AVNRT.  Less likely diagnosis would be a junctional tachycardia although this would not initiate with a prolonged PR interval.  Recommend proceeding with EP study and ablation.  I discussed the risk, benefits and alternatives at length with the patient during today's visit.  Risk, benefits, and alternatives to EP study and radiofrequency ablation for SVT were also discussed in detail today. These risks include but are not limited to complete heart block, stroke, bleeding, vascular damage, tamponade, perforation, and death. Carto and ICE would be used for the procedure.    The patient and his wife will go home to think about the options of ablation vs increased metoprolol and let us know how they would like to proceed.   2. SSS s/p PPM Patient has a dual-chamber permanent pacemaker in situ that is well-functioning on today's interrogation.  Medication Adjustments/Labs and Tests Ordered: Current medicines are reviewed at length with the patient today.  Concerns regarding medicines are outlined above.  Orders Placed This Encounter  Procedures   EKG 12-Lead   No orders of the defined types were placed in this encounter.   Patient Instructions  Medication Instructions:  Your physician recommends that you continue on your current medications as directed. Please refer to the Current Medication list given to you today.  Labwork: None ordered.  Testing/Procedures: Your physician has recommended that you have an ablation. Catheter ablation is a medical procedure used to treat some cardiac arrhythmias (irregular heartbeats). During catheter ablation, a long, thin, flexible tube is put into a blood vessel in your groin (upper thigh), or neck. This tube is called an ablation catheter. It is then guided to your  heart through the blood vessel. Radio frequency waves destroy small areas of heart tissue where abnormal heartbeats may cause an arrhythmia to start. Please see the instruction sheet given to you today.  Follow-Up:  The following dates are available for procedures:  October 15, 22, 25, 28  If you decide to go ahead with this procedure just call the [facility] office and ask for Sonia Baller RN [phone]  Any Other Special Instructions Will Be Listed Below (  If Applicable).  If you need a refill on your cardiac medications before your next appointment, please call your pharmacy.    Cardiac Ablation Cardiac ablation is a procedure to disable (ablate) a small amount of heart tissue in very specific places. The heart has many electrical connections. Sometimes these connections are abnormal and can cause the heart to beat very fast or irregularly. Ablating some of the problem areas can improve the heart rhythm or return it to normal. Ablation may be done for people who:  Have Wolff-Parkinson-White syndrome.  Have fast heart rhythms (tachycardia).  Have taken medicines for an abnormal heart rhythm (arrhythmia) that were not effective or caused side effects.  Have a high-risk heartbeat that may be life-threatening. During the procedure, a small incision is made in the neck or the groin, and a long, thin, flexible tube (catheter) is inserted into the incision and moved to the heart. Small devices (electrodes) on the tip of the catheter will send out electrical currents. A type of X-ray (fluoroscopy) will be used to help guide the catheter and to provide images of the heart. Tell a health care provider about:  Any allergies you have.  All medicines you are taking, including vitamins, herbs, eye drops, creams, and over-the-counter medicines.  Any problems you or family members have had with anesthetic medicines.  Any blood disorders you have.  Any surgeries you have had.  Any medical  conditions you have, such as kidney failure.  Whether you are pregnant or may be pregnant. What are the risks? Generally, this is a safe procedure. However, problems may occur, including:  Infection.  Bruising and bleeding at the catheter insertion site.  Bleeding into the chest, especially into the sac that surrounds the heart. This is a serious complication.  Stroke or blood clots.  Damage to other structures or organs.  Allergic reaction to medicines or dyes.  Need for a permanent pacemaker if the normal electrical system is damaged. A pacemaker is a small computer that sends electrical signals to the heart and helps your heart beat normally.  The procedure not being fully effective. This may not be recognized until months later. Repeat ablation procedures are sometimes required. What happens before the procedure?  Follow instructions from your health care provider about eating or drinking restrictions.  Ask your health care provider about: ? Changing or stopping your regular medicines. This is especially important if you are taking diabetes medicines or blood thinners. ? Taking medicines such as aspirin and ibuprofen. These medicines can thin your blood. Do not take these medicines before your procedure if your health care provider instructs you not to.  Plan to have someone take you home from the hospital or clinic.  If you will be going home right after the procedure, plan to have someone with you for 24 hours. What happens during the procedure?  To lower your risk of infection: ? Your health care team will wash or sanitize their hands. ? Your skin will be washed with soap. ? Hair may be removed from the incision area.  An IV tube will be inserted into one of your veins.  You will be given a medicine to help you relax (sedative).  The skin on your neck or groin will be numbed.  An incision will be made in your neck or your groin.  A needle will be inserted through  the incision and into a large vein in your neck or groin.  A catheter will be inserted into the  needle and moved to your heart.  Dye may be injected through the catheter to help your surgeon see the area of the heart that needs treatment.  Electrical currents will be sent from the catheter to ablate heart tissue in desired areas. There are three types of energy that may be used to ablate heart tissue: ? Heat (radiofrequency energy). ? Laser energy. ? Extreme cold (cryoablation).  When the necessary tissue has been ablated, the catheter will be removed.  Pressure will be held on the catheter insertion area to prevent excessive bleeding.  A bandage (dressing) will be placed over the catheter insertion area. The procedure may vary among health care providers and hospitals. What happens after the procedure?  Your blood pressure, heart rate, breathing rate, and blood oxygen level will be monitored until the medicines you were given have worn off.  Your catheter insertion area will be monitored for bleeding. You will need to lie still for a few hours to ensure that you do not bleed from the catheter insertion area.  Do not drive for 24 hours or as long as directed by your health care provider. Summary  Cardiac ablation is a procedure to disable (ablate) a small amount of heart tissue in very specific places. Ablating some of the problem areas can improve the heart rhythm or return it to normal.  During the procedure, electrical currents will be sent from the catheter to ablate heart tissue in desired areas. This information is not intended to replace advice given to you by your health care provider. Make sure you discuss any questions you have with your health care provider. Document Revised: 08/14/2017 Document Reviewed: 01/11/2016 Elsevier Patient Education  2020 [place], Lars Mage, MD, [facility]  11/12/2019 2:50 PM    Electrophysiology [facility]  HeartCare     Anticoagulation instructions: The patient is not on anticoagulation.  Medication instructions morning of: The patient should take their medications the morning of the procedure, except for any diuretic (lasix, torsemide, or bumex).   Discharge: Our plan will be to discharge the patient same day after a period of observation

## 2019-11-12 ENCOUNTER — Ambulatory Visit (INDEPENDENT_AMBULATORY_CARE_PROVIDER_SITE_OTHER): Payer: Medicare Other | Admitting: Cardiology

## 2019-11-12 ENCOUNTER — Other Ambulatory Visit: Payer: Self-pay

## 2019-11-12 ENCOUNTER — Encounter: Payer: Self-pay | Admitting: Cardiology

## 2019-11-12 VITALS — BP 146/80 | HR 61 | Ht 71.0 in | Wt 231.8 lb

## 2019-11-12 DIAGNOSIS — I471 Supraventricular tachycardia: Secondary | ICD-10-CM

## 2019-11-12 DIAGNOSIS — Z950 Presence of cardiac pacemaker: Secondary | ICD-10-CM | POA: Diagnosis not present

## 2019-11-12 DIAGNOSIS — I495 Sick sinus syndrome: Secondary | ICD-10-CM | POA: Diagnosis not present

## 2019-11-12 LAB — CUP PACEART INCLINIC DEVICE CHECK
Battery Remaining Longevity: 134 mo
Battery Voltage: 3.01 V
Brady Statistic AP VP Percent: 35.64 %
Brady Statistic AP VS Percent: 0.08 %
Brady Statistic AS VP Percent: 54.63 %
Brady Statistic AS VS Percent: 9.65 %
Brady Statistic RA Percent Paced: 36.44 %
Brady Statistic RV Percent Paced: 90.27 %
Date Time Interrogation Session: 20210907160913
Implantable Lead Implant Date: 20190206
Implantable Lead Implant Date: 20190206
Implantable Lead Location: 753859
Implantable Lead Location: 753860
Implantable Lead Model: 5076
Implantable Lead Model: 5076
Implantable Pulse Generator Implant Date: 20190206
Lead Channel Impedance Value: 323 Ohm
Lead Channel Impedance Value: 380 Ohm
Lead Channel Impedance Value: 437 Ohm
Lead Channel Impedance Value: 513 Ohm
Lead Channel Pacing Threshold Amplitude: 0.625 V
Lead Channel Pacing Threshold Amplitude: 0.625 V
Lead Channel Pacing Threshold Pulse Width: 0.4 ms
Lead Channel Pacing Threshold Pulse Width: 0.4 ms
Lead Channel Sensing Intrinsic Amplitude: 2.75 mV
Lead Channel Sensing Intrinsic Amplitude: 2.875 mV
Lead Channel Sensing Intrinsic Amplitude: 5.25 mV
Lead Channel Sensing Intrinsic Amplitude: 6.875 mV
Lead Channel Setting Pacing Amplitude: 2 V
Lead Channel Setting Pacing Amplitude: 2.5 V
Lead Channel Setting Pacing Pulse Width: 0.4 ms
Lead Channel Setting Sensing Sensitivity: 0.9 mV

## 2019-11-12 NOTE — Patient Instructions (Addendum)
Medication Instructions:  Your physician recommends that you continue on your current medications as directed. Please refer to the Current Medication list given to you today.  Labwork: None ordered.  Testing/Procedures: Your physician has recommended that you have an ablation. Catheter ablation is a medical procedure used to treat some cardiac arrhythmias (irregular heartbeats). During catheter ablation, a long, thin, flexible tube is put into a blood vessel in your groin (upper thigh), or neck. This tube is called an ablation catheter. It is then guided to your heart through the blood vessel. Radio frequency waves destroy small areas of heart tissue where abnormal heartbeats may cause an arrhythmia to start. Please see the instruction sheet given to you today.  Follow-Up:  The following dates are available for procedures:  October 15, 22, 25, 28  If you decide to go ahead with this procedure just call the [facility] office and ask for Sonia Baller RN [phone]  Any Other Special Instructions Will Be Listed Below (If Applicable).  If you need a refill on your cardiac medications before your next appointment, please call your pharmacy.    Cardiac Ablation Cardiac ablation is a procedure to disable (ablate) a small amount of heart tissue in very specific places. The heart has many electrical connections. Sometimes these connections are abnormal and can cause the heart to beat very fast or irregularly. Ablating some of the problem areas can improve the heart rhythm or return it to normal. Ablation may be done for people who:  Have Wolff-Parkinson-White syndrome.  Have fast heart rhythms (tachycardia).  Have taken medicines for an abnormal heart rhythm (arrhythmia) that were not effective or caused side effects.  Have a high-risk heartbeat that may be life-threatening. During the procedure, a small incision is made in the neck or the groin, and a long, thin, flexible tube (catheter) is  inserted into the incision and moved to the heart. Small devices (electrodes) on the tip of the catheter will send out electrical currents. A type of X-ray (fluoroscopy) will be used to help guide the catheter and to provide images of the heart. Tell a health care provider about:  Any allergies you have.  All medicines you are taking, including vitamins, herbs, eye drops, creams, and over-the-counter medicines.  Any problems you or family members have had with anesthetic medicines.  Any blood disorders you have.  Any surgeries you have had.  Any medical conditions you have, such as kidney failure.  Whether you are pregnant or may be pregnant. What are the risks? Generally, this is a safe procedure. However, problems may occur, including:  Infection.  Bruising and bleeding at the catheter insertion site.  Bleeding into the chest, especially into the sac that surrounds the heart. This is a serious complication.  Stroke or blood clots.  Damage to other structures or organs.  Allergic reaction to medicines or dyes.  Need for a permanent pacemaker if the normal electrical system is damaged. A pacemaker is a small computer that sends electrical signals to the heart and helps your heart beat normally.  The procedure not being fully effective. This may not be recognized until months later. Repeat ablation procedures are sometimes required. What happens before the procedure?  Follow instructions from your health care provider about eating or drinking restrictions.  Ask your health care provider about: ? Changing or stopping your regular medicines. This is especially important if you are taking diabetes medicines or blood thinners. ? Taking medicines such as aspirin and ibuprofen. These medicines can  thin your blood. Do not take these medicines before your procedure if your health care provider instructs you not to.  Plan to have someone take you home from the hospital or  clinic.  If you will be going home right after the procedure, plan to have someone with you for 24 hours. What happens during the procedure?  To lower your risk of infection: ? Your health care team will wash or sanitize their hands. ? Your skin will be washed with soap. ? Hair may be removed from the incision area.  An IV tube will be inserted into one of your veins.  You will be given a medicine to help you relax (sedative).  The skin on your neck or groin will be numbed.  An incision will be made in your neck or your groin.  A needle will be inserted through the incision and into a large vein in your neck or groin.  A catheter will be inserted into the needle and moved to your heart.  Dye may be injected through the catheter to help your surgeon see the area of the heart that needs treatment.  Electrical currents will be sent from the catheter to ablate heart tissue in desired areas. There are three types of energy that may be used to ablate heart tissue: ? Heat (radiofrequency energy). ? Laser energy. ? Extreme cold (cryoablation).  When the necessary tissue has been ablated, the catheter will be removed.  Pressure will be held on the catheter insertion area to prevent excessive bleeding.  A bandage (dressing) will be placed over the catheter insertion area. The procedure may vary among health care providers and hospitals. What happens after the procedure?  Your blood pressure, heart rate, breathing rate, and blood oxygen level will be monitored until the medicines you were given have worn off.  Your catheter insertion area will be monitored for bleeding. You will need to lie still for a few hours to ensure that you do not bleed from the catheter insertion area.  Do not drive for 24 hours or as long as directed by your health care provider. Summary  Cardiac ablation is a procedure to disable (ablate) a small amount of heart tissue in very specific places. Ablating some  of the problem areas can improve the heart rhythm or return it to normal.  During the procedure, electrical currents will be sent from the catheter to ablate heart tissue in desired areas. This information is not intended to replace advice given to you by your health care provider. Make sure you discuss any questions you have with your health care provider. Document Revised: 08/14/2017 Document Reviewed: 01/11/2016 Elsevier Patient Education  [place].

## 2019-11-21 ENCOUNTER — Other Ambulatory Visit: Payer: Self-pay | Admitting: Cardiovascular Disease

## 2019-11-26 DIAGNOSIS — H16223 Keratoconjunctivitis sicca, not specified as Sjogren's, bilateral: Secondary | ICD-10-CM | POA: Diagnosis not present

## 2019-11-26 DIAGNOSIS — H2513 Age-related nuclear cataract, bilateral: Secondary | ICD-10-CM | POA: Diagnosis not present

## 2019-11-26 DIAGNOSIS — H01021 Squamous blepharitis right upper eyelid: Secondary | ICD-10-CM | POA: Diagnosis not present

## 2019-11-26 DIAGNOSIS — H40053 Ocular hypertension, bilateral: Secondary | ICD-10-CM | POA: Diagnosis not present

## 2019-11-26 DIAGNOSIS — H01024 Squamous blepharitis left upper eyelid: Secondary | ICD-10-CM | POA: Diagnosis not present

## 2019-12-10 ENCOUNTER — Telehealth: Payer: Self-pay | Admitting: Cardiology

## 2019-12-10 DIAGNOSIS — I471 Supraventricular tachycardia: Secondary | ICD-10-CM

## 2019-12-10 NOTE — Telephone Encounter (Signed)
Pt called in and stated that he was calling back to let Dr Quentin Ore know that he does want to do the ablation and he would like to do it on the 25th if possible   Best number [phone]

## 2019-12-11 NOTE — Telephone Encounter (Signed)
Returned call to Pt.  Advised next dates available for SVT ablation are:  December 10, 17, 23  Will discuss with wife and call this nurse back tomorrow.

## 2019-12-12 NOTE — Telephone Encounter (Signed)
Call back received from Pt.  Pt would like to schedule SVT ablation for December 10  Procedure scheduled.  Labs/covid test scheduled.  Need to confirm toprol instructions and complete instruction letter and mail to pt.

## 2019-12-17 NOTE — Telephone Encounter (Signed)
Instruction letter completed and mailed to Pt.  Hold Toprol 5 days.

## 2019-12-23 DIAGNOSIS — M205X1 Other deformities of toe(s) (acquired), right foot: Secondary | ICD-10-CM | POA: Diagnosis not present

## 2019-12-23 DIAGNOSIS — M205X2 Other deformities of toe(s) (acquired), left foot: Secondary | ICD-10-CM | POA: Diagnosis not present

## 2019-12-23 DIAGNOSIS — R238 Other skin changes: Secondary | ICD-10-CM | POA: Diagnosis not present

## 2019-12-23 DIAGNOSIS — M792 Neuralgia and neuritis, unspecified: Secondary | ICD-10-CM | POA: Diagnosis not present

## 2020-01-03 ENCOUNTER — Other Ambulatory Visit: Payer: Self-pay | Admitting: Cardiovascular Disease

## 2020-01-06 DIAGNOSIS — M205X2 Other deformities of toe(s) (acquired), left foot: Secondary | ICD-10-CM | POA: Diagnosis not present

## 2020-01-06 DIAGNOSIS — M722 Plantar fascial fibromatosis: Secondary | ICD-10-CM | POA: Diagnosis not present

## 2020-01-06 DIAGNOSIS — R238 Other skin changes: Secondary | ICD-10-CM | POA: Diagnosis not present

## 2020-01-06 DIAGNOSIS — M205X1 Other deformities of toe(s) (acquired), right foot: Secondary | ICD-10-CM | POA: Diagnosis not present

## 2020-01-13 DIAGNOSIS — Z1159 Encounter for screening for other viral diseases: Secondary | ICD-10-CM | POA: Diagnosis not present

## 2020-01-15 DIAGNOSIS — M205X2 Other deformities of toe(s) (acquired), left foot: Secondary | ICD-10-CM | POA: Diagnosis not present

## 2020-01-15 DIAGNOSIS — M205X1 Other deformities of toe(s) (acquired), right foot: Secondary | ICD-10-CM | POA: Diagnosis not present

## 2020-01-15 DIAGNOSIS — M792 Neuralgia and neuritis, unspecified: Secondary | ICD-10-CM | POA: Diagnosis not present

## 2020-01-15 DIAGNOSIS — R238 Other skin changes: Secondary | ICD-10-CM | POA: Diagnosis not present

## 2020-01-20 DIAGNOSIS — R600 Localized edema: Secondary | ICD-10-CM | POA: Diagnosis not present

## 2020-01-20 DIAGNOSIS — I83892 Varicose veins of left lower extremities with other complications: Secondary | ICD-10-CM | POA: Diagnosis not present

## 2020-01-20 DIAGNOSIS — I87022 Postthrombotic syndrome with inflammation of left lower extremity: Secondary | ICD-10-CM | POA: Diagnosis not present

## 2020-01-22 ENCOUNTER — Ambulatory Visit (INDEPENDENT_AMBULATORY_CARE_PROVIDER_SITE_OTHER): Payer: Medicare Other

## 2020-01-22 DIAGNOSIS — I495 Sick sinus syndrome: Secondary | ICD-10-CM | POA: Diagnosis not present

## 2020-01-22 DIAGNOSIS — I83892 Varicose veins of left lower extremities with other complications: Secondary | ICD-10-CM | POA: Diagnosis not present

## 2020-01-22 LAB — CUP PACEART REMOTE DEVICE CHECK
Battery Remaining Longevity: 129 mo
Battery Voltage: 3.01 V
Brady Statistic AP VP Percent: 48.68 %
Brady Statistic AP VS Percent: 0.28 %
Brady Statistic AS VP Percent: 35.64 %
Brady Statistic AS VS Percent: 15.4 %
Brady Statistic RA Percent Paced: 50.36 %
Brady Statistic RV Percent Paced: 84.32 %
Date Time Interrogation Session: 20211117011538
Implantable Lead Implant Date: 20190206
Implantable Lead Implant Date: 20190206
Implantable Lead Location: 753859
Implantable Lead Location: 753860
Implantable Lead Model: 5076
Implantable Lead Model: 5076
Implantable Pulse Generator Implant Date: 20190206
Lead Channel Impedance Value: 323 Ohm
Lead Channel Impedance Value: 380 Ohm
Lead Channel Impedance Value: 418 Ohm
Lead Channel Impedance Value: 475 Ohm
Lead Channel Pacing Threshold Amplitude: 0.625 V
Lead Channel Pacing Threshold Amplitude: 0.625 V
Lead Channel Pacing Threshold Pulse Width: 0.4 ms
Lead Channel Pacing Threshold Pulse Width: 0.4 ms
Lead Channel Sensing Intrinsic Amplitude: 2.375 mV
Lead Channel Sensing Intrinsic Amplitude: 2.375 mV
Lead Channel Sensing Intrinsic Amplitude: 6.5 mV
Lead Channel Sensing Intrinsic Amplitude: 6.5 mV
Lead Channel Setting Pacing Amplitude: 2 V
Lead Channel Setting Pacing Amplitude: 2.5 V
Lead Channel Setting Pacing Pulse Width: 0.4 ms
Lead Channel Setting Sensing Sensitivity: 0.9 mV

## 2020-01-23 DIAGNOSIS — D2371 Other benign neoplasm of skin of right lower limb, including hip: Secondary | ICD-10-CM | POA: Diagnosis not present

## 2020-01-23 DIAGNOSIS — L259 Unspecified contact dermatitis, unspecified cause: Secondary | ICD-10-CM | POA: Diagnosis not present

## 2020-01-24 NOTE — Progress Notes (Signed)
Remote pacemaker transmission.   

## 2020-01-28 DIAGNOSIS — L259 Unspecified contact dermatitis, unspecified cause: Secondary | ICD-10-CM | POA: Diagnosis not present

## 2020-02-06 DIAGNOSIS — Z23 Encounter for immunization: Secondary | ICD-10-CM | POA: Diagnosis not present

## 2020-02-12 ENCOUNTER — Other Ambulatory Visit: Payer: Medicare Other | Admitting: *Deleted

## 2020-02-12 ENCOUNTER — Other Ambulatory Visit: Payer: Self-pay

## 2020-02-12 ENCOUNTER — Other Ambulatory Visit (HOSPITAL_COMMUNITY)
Admission: RE | Admit: 2020-02-12 | Discharge: 2020-02-12 | Disposition: A | Discharge status: Home / Self Care | Payer: Medicare Other | Source: Ambulatory Visit | Attending: Cardiology | Admitting: Cardiology

## 2020-02-12 DIAGNOSIS — I471 Supraventricular tachycardia: Secondary | ICD-10-CM | POA: Diagnosis not present

## 2020-02-12 DIAGNOSIS — Z20822 Contact with and (suspected) exposure to covid-19: Secondary | ICD-10-CM | POA: Insufficient documentation

## 2020-02-12 DIAGNOSIS — I739 Peripheral vascular disease, unspecified: Secondary | ICD-10-CM | POA: Diagnosis not present

## 2020-02-12 DIAGNOSIS — M1000 Idiopathic gout, unspecified site: Secondary | ICD-10-CM | POA: Diagnosis not present

## 2020-02-12 DIAGNOSIS — I10 Essential (primary) hypertension: Secondary | ICD-10-CM | POA: Diagnosis not present

## 2020-02-12 DIAGNOSIS — Z01812 Encounter for preprocedural laboratory examination: Secondary | ICD-10-CM | POA: Diagnosis not present

## 2020-02-12 DIAGNOSIS — E782 Mixed hyperlipidemia: Secondary | ICD-10-CM | POA: Diagnosis not present

## 2020-02-12 DIAGNOSIS — R7303 Prediabetes: Secondary | ICD-10-CM | POA: Diagnosis not present

## 2020-02-12 LAB — BASIC METABOLIC PANEL
BUN/Creatinine Ratio: 15 (ref 10–24)
BUN: 17 mg/dL (ref 8–27)
CO2: 26 mmol/L (ref 20–29)
Calcium: 9.8 mg/dL (ref 8.6–10.2)
Chloride: 104 mmol/L (ref 96–106)
Creatinine, Ser: 1.14 mg/dL (ref 0.76–1.27)
GFR calc Af Amer: 71 mL/min/{1.73_m2} (ref >59)
GFR calc non Af Amer: 61 mL/min/{1.73_m2} (ref >59)
Glucose: 109 mg/dL — ABNORMAL HIGH (ref 65–99)
Potassium: 4.5 mmol/L (ref 3.5–5.2)
Sodium: 139 mmol/L (ref 134–144)

## 2020-02-12 LAB — SARS CORONAVIRUS 2 (TAT 6-24 HRS): SARS Coronavirus 2: NEGATIVE

## 2020-02-12 LAB — CBC WITH DIFFERENTIAL/PLATELET
Basophils Absolute: 0 10*3/uL (ref 0.0–0.2)
Basos: 0 %
EOS (ABSOLUTE): 0.2 10*3/uL (ref 0.0–0.4)
Eos: 2 %
Hematocrit: 43.1 % (ref 37.5–51.0)
Hemoglobin: 14.7 g/dL (ref 13.0–17.7)
Lymphocytes Absolute: 2.6 10*3/uL (ref 0.7–3.1)
Lymphs: 35 %
MCH: 30.3 pg (ref 26.6–33.0)
MCHC: 34.1 g/dL (ref 31.5–35.7)
MCV: 89 fL (ref 79–97)
Monocytes Absolute: 0.8 10*3/uL (ref 0.1–0.9)
Monocytes: 11 %
Neutrophils Absolute: 3.8 10*3/uL (ref 1.4–7.0)
Neutrophils: 52 %
Platelets: 208 10*3/uL (ref 150–450)
RBC: 4.85 x10E6/uL (ref 4.14–5.80)
RDW: 14.2 % (ref 11.6–15.4)
WBC: 7.4 10*3/uL (ref 3.4–10.8)

## 2020-02-13 NOTE — Progress Notes (Signed)
Instructed patient on the following items: Arrival time 0830 Nothing to eat or drink after midnight No meds AM of procedure Responsible person to drive you home and stay with you for 24 hrs    

## 2020-02-14 ENCOUNTER — Ambulatory Visit (HOSPITAL_COMMUNITY)
Admission: RE | Disposition: A | Discharge status: Home / Self Care | Payer: Self-pay | Source: Home / Self Care | Attending: Cardiology

## 2020-02-14 ENCOUNTER — Ambulatory Visit (HOSPITAL_COMMUNITY): Payer: Medicare Other | Admitting: Anesthesiology

## 2020-02-14 ENCOUNTER — Encounter (HOSPITAL_COMMUNITY): Payer: Self-pay | Admitting: Cardiology

## 2020-02-14 ENCOUNTER — Ambulatory Visit (HOSPITAL_COMMUNITY)
Admission: RE | Admit: 2020-02-14 | Discharge: 2020-02-14 | Disposition: A | Discharge status: Home / Self Care | Payer: Medicare Other | Attending: Cardiology | Admitting: Cardiology

## 2020-02-14 ENCOUNTER — Other Ambulatory Visit: Payer: Self-pay

## 2020-02-14 DIAGNOSIS — I10 Essential (primary) hypertension: Secondary | ICD-10-CM | POA: Diagnosis not present

## 2020-02-14 DIAGNOSIS — Z7982 Long term (current) use of aspirin: Secondary | ICD-10-CM | POA: Diagnosis not present

## 2020-02-14 DIAGNOSIS — I471 Supraventricular tachycardia: Secondary | ICD-10-CM | POA: Diagnosis not present

## 2020-02-14 DIAGNOSIS — I495 Sick sinus syndrome: Secondary | ICD-10-CM | POA: Diagnosis not present

## 2020-02-14 DIAGNOSIS — Z8249 Family history of ischemic heart disease and other diseases of the circulatory system: Secondary | ICD-10-CM | POA: Diagnosis not present

## 2020-02-14 DIAGNOSIS — Z79899 Other long term (current) drug therapy: Secondary | ICD-10-CM | POA: Diagnosis not present

## 2020-02-14 DIAGNOSIS — I441 Atrioventricular block, second degree: Secondary | ICD-10-CM | POA: Diagnosis not present

## 2020-02-14 DIAGNOSIS — Z950 Presence of cardiac pacemaker: Secondary | ICD-10-CM | POA: Diagnosis not present

## 2020-02-14 DIAGNOSIS — E7800 Pure hypercholesterolemia, unspecified: Secondary | ICD-10-CM | POA: Diagnosis not present

## 2020-02-14 HISTORY — PX: SVT ABLATION: EP1225

## 2020-02-14 SURGERY — SVT ABLATION
Anesthesia: General

## 2020-02-14 MED ORDER — SODIUM CHLORIDE 0.9% FLUSH: 3.0000 mL | INTRAVENOUS | Status: DC | PRN

## 2020-02-14 MED ORDER — SODIUM CHLORIDE 0.9 % IV SOLN: 250.0000 mL | INTRAVENOUS | Status: DC | PRN

## 2020-02-14 MED ORDER — CEFAZOLIN SODIUM-DEXTROSE 2-4 GM/100ML-% IV SOLN
INTRAVENOUS | Status: AC
  Filled 2020-02-14: qty 100

## 2020-02-14 MED ORDER — FENTANYL CITRATE (PF) 250 MCG/5ML IJ SOLN
INTRAMUSCULAR | Status: DC | PRN
  Administered 2020-02-14 (×2): 25 ug via INTRAVENOUS

## 2020-02-14 MED ORDER — SODIUM CHLORIDE 0.9 % IV SOLN: INTRAVENOUS | Status: DC

## 2020-02-14 MED ORDER — ONDANSETRON HCL 4 MG/2ML IJ SOLN: 4.0000 mg | Freq: Once | INTRAMUSCULAR | Status: DC | PRN

## 2020-02-14 MED ORDER — ACETAMINOPHEN 10 MG/ML IV SOLN
1000.0000 mg | Freq: Once | INTRAVENOUS | Status: DC | PRN
  Filled 2020-02-14: qty 100

## 2020-02-14 MED ORDER — BUPIVACAINE HCL (PF) 0.25 % IJ SOLN
INTRAMUSCULAR | Status: AC
  Filled 2020-02-14: qty 30

## 2020-02-14 MED ORDER — MIDAZOLAM HCL 2 MG/2ML IJ SOLN
INTRAMUSCULAR | Status: DC | PRN
  Administered 2020-02-14: 1 mg via INTRAVENOUS
  Administered 2020-02-14: .5 mg via INTRAVENOUS

## 2020-02-14 MED ORDER — ISOPROTERENOL HCL 0.2 MG/ML IJ SOLN
INTRAVENOUS | Status: DC | PRN
  Administered 2020-02-14: 4 ug/min via INTRAVENOUS

## 2020-02-14 MED ORDER — HEPARIN (PORCINE) IN NACL 1000-0.9 UT/500ML-% IV SOLN
INTRAVENOUS | Status: DC | PRN
  Administered 2020-02-14: 500 mL

## 2020-02-14 MED ORDER — FENTANYL CITRATE (PF) 100 MCG/2ML IJ SOLN: 25.0000 ug | INTRAMUSCULAR | Status: DC | PRN

## 2020-02-14 MED ORDER — ISOPROTERENOL HCL 0.2 MG/ML IJ SOLN
INTRAMUSCULAR | Status: AC
  Filled 2020-02-14: qty 5

## 2020-02-14 MED ORDER — CEFAZOLIN SODIUM-DEXTROSE 2-3 GM-%(50ML) IV SOLR
INTRAVENOUS | Status: DC | PRN
  Administered 2020-02-14: 2 g via INTRAVENOUS

## 2020-02-14 MED ORDER — HEPARIN (PORCINE) IN NACL 1000-0.9 UT/500ML-% IV SOLN
INTRAVENOUS | Status: AC
  Filled 2020-02-14: qty 500

## 2020-02-14 MED ORDER — ONDANSETRON HCL 4 MG/2ML IJ SOLN
INTRAMUSCULAR | Status: DC | PRN
  Administered 2020-02-14: 4 mg via INTRAVENOUS

## 2020-02-14 MED ORDER — ACETAMINOPHEN 325 MG PO TABS: 650.0000 mg | ORAL_TABLET | ORAL | Status: DC | PRN

## 2020-02-14 MED ORDER — ONDANSETRON HCL 4 MG/2ML IJ SOLN: 4.0000 mg | Freq: Four times a day (QID) | INTRAMUSCULAR | Status: DC | PRN

## 2020-02-14 MED ORDER — BUPIVACAINE HCL (PF) 0.25 % IJ SOLN
INTRAMUSCULAR | Status: DC | PRN
  Administered 2020-02-14: 30 mL

## 2020-02-14 MED ORDER — SODIUM CHLORIDE 0.9% FLUSH: 3.0000 mL | Freq: Two times a day (BID) | INTRAVENOUS | Status: DC

## 2020-02-14 MED ORDER — PROPOFOL 10 MG/ML IV BOLUS
INTRAVENOUS | Status: DC | PRN
  Administered 2020-02-14 (×2): 20 mg via INTRAVENOUS

## 2020-02-14 SURGICAL SUPPLY — 14 items
BAG SNAP BAND KOVER 36X36 (MISCELLANEOUS) ×2 IMPLANT
CATH CRD2 QUAD 6FR 5 (CATHETERS) ×2 IMPLANT
CATH JOSEPH QUAD ALLRED 6F REP (CATHETERS) ×2 IMPLANT
CATH WEB BI DIR CSDF CRV REPRO (CATHETERS) ×2 IMPLANT
CLOSURE PERCLOSE PROSTYLE (VASCULAR PRODUCTS) ×6 IMPLANT
MAT PREVALON FULL STRYKER (MISCELLANEOUS) ×2 IMPLANT
PACK EP LATEX FREE (CUSTOM PROCEDURE TRAY) ×3
PACK EP LF (CUSTOM PROCEDURE TRAY) ×1 IMPLANT
PAD PRO RADIOLUCENT 2001M-C (PAD) ×3 IMPLANT
PATCH CARTO3 (PAD) ×2 IMPLANT
SHEATH PINNACLE 7F 10CM (SHEATH) ×4 IMPLANT
SHEATH PINNACLE 8F 10CM (SHEATH) ×2 IMPLANT
SHEATH PROBE COVER 6X72 (BAG) ×2 IMPLANT
TUBING SMART ABLATE COOLFLOW (TUBING) IMPLANT

## 2020-02-14 NOTE — Progress Notes (Signed)
Discharge instructions reviewed with pt and his wife (via telephone) Both voice understanding.  

## 2020-02-14 NOTE — Progress Notes (Signed)
Spoke with Jonni Sanger, [place] about med rec states no changes with his meds D/c instructions printed

## 2020-02-14 NOTE — Progress Notes (Addendum)
Dr Quentin Ore in to see pt. States ok to discharge at 1600

## 2020-02-14 NOTE — H&P (Signed)
Electrophysiology Office Note:    Date:  11/12/2019   ID:  [name], DOB Dec 15, 1940, MRN [id number]  PCP:  Merrilee Seashore, MD    [facility] Cardiologist:  Sanda Klein, MD  [facility] Electrophysiologist:  Vickie Epley, MD   Referring MD: Merrilee Seashore, MD   Chief Complaint: SVT on PPM interrogation  History of Present Illness:    [name] is a 79 y.o. male with a hx of tachycardia-bradycardia syndrome, second-degree AV block status post dual-chamber permanent pacemaker in February 2019 presents to the clinic for an evaluation of paroxysmal SVT observed on a recent pacemaker interrogation at the request of Dr. Sallyanne Kuster.  Additional medical problems include obesity, hyperlipidemia, hypertension, obstructive sleep apnea, gout, deep vein thrombosis.  He tells me that he intermittently has symptoms of racing heart while he works outdoors. 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       Past Medical History:  Diagnosis Date  . Arthritis    "probably; hands" (04/12/2017)  . High cholesterol   . History of gout   . Hypertension   . OSA (obstructive sleep apnea)    per pt noncompliant CPAP has not use it since 2015  . Presence of permanent cardiac pacemaker 04/12/2017   Dual Chamber  . Wears dentures    upper         Past Surgical History:  Procedure Laterality Date  . CHOLECYSTECTOMY N/A 08/19/2017   Procedure: LAPAROSCOPIC ASSISTED OPEN CHOLECYSTECTOMY WITH INTRAOPERATIVE CHOLANGIOGRAM;  Surgeon: Armandina Gemma, MD;  Location: [facility];  Service: General;  Laterality: N/A;  . ELBOW BURSA SURGERY Left 07-21-2009   extensive bursectomy/ tenosynovectomy/ ulnar nerve release  . EUS N/A 08/18/2017   Procedure: ESOPHAGEAL ENDOSCOPIC ULTRASOUND (EUS) RADIAL;  Surgeon: Milus Banister, MD;  Location: [facility];  Service: Endoscopy;  Laterality: N/A;  . HAMMER TOE SURGERY Right 2015  . HAMMER TOE  SURGERY Right 12/11/2014   Procedure: 4th HAMMER TOE CORRECTION, EXCISION LESION 3rd TOE RIGHT FOOT;  Surgeon: Rosemary Holms, DPM;  Location: [place];  Service: Podiatry;  Laterality: Right;  . INSERT / REPLACE / REMOVE PACEMAKER  04/12/2017   Dual Chamber  . MICROLARYNGOSCOPY  06-07-2006   w/  Excision bilateral vocal cord lesions (bilateral benign nodules)  . PACEMAKER IMPLANT N/A 04/12/2017   Procedure: PACEMAKER IMPLANT - Dual Chamber;  Surgeon: Sanda Klein, MD;  Location: [facility];  Service: Cardiovascular;  Laterality: N/A;    Current Medications: Active Medications      Current Meds  Medication Sig  . aspirin EC 81 MG tablet Take 81 mg by mouth daily.  Marland Kitchen atorvastatin (LIPITOR) 20 MG tablet Take 20 mg by mouth daily.  . fluticasone (FLONASE) 50 MCG/ACT nasal spray Place 1 spray into both nostrils daily.  Marland Kitchen losartan (COZAAR) 50 MG tablet TAKE 1 TABLET BY MOUTH EVERY DAY  . metoprolol succinate (TOPROL-XL) 25 MG 24 hr tablet Take 1 tablet (25 mg total) by mouth daily.  . Multiple Vitamin (MULTIVITAMIN WITH MINERALS) TABS tablet Take 1 tablet by mouth daily.  . potassium chloride SA (KLOR-CON) 20 MEQ tablet Take 20 mEq by mouth daily.  . tamsulosin (FLOMAX) 0.4 MG CAPS capsule Take 0.4 mg by mouth daily.       Allergies:   Patient has no known allergies.   Social History        Socioeconomic History  . Marital status: Married  Spouse name: Not on file  . Number of children: Not on file  . Years of education: Not on file  . Highest education level: Not on file  Occupational History  . Not on file  Tobacco Use  . Smoking status: Never Smoker  . Smokeless tobacco: Never Used  Vaping Use  . Vaping Use: Never used  Substance and Sexual Activity  . Alcohol use: Yes    Alcohol/week: 2.0 standard drinks    Types: 2 Standard drinks or equivalent per week  . Drug use: No  . Sexual activity: Yes  Other Topics Concern  . Not  on file  Social History Narrative  . Not on file   Social Determinants of Health      Financial Resource Strain:   . Difficulty of Paying Living Expenses: Not on file  Food Insecurity:   . Worried About Charity fundraiser in the Last Year: Not on file  . Ran Out of Food in the Last Year: Not on file  Transportation Needs:   . Lack of Transportation (Medical): Not on file  . Lack of Transportation (Non-Medical): Not on file  Physical Activity:   . Days of Exercise per Week: Not on file  . Minutes of Exercise per Session: Not on file  Stress:   . Feeling of Stress : Not on file  Social Connections:   . Frequency of Communication with Friends and Family: Not on file  . Frequency of Social Gatherings with Friends and Family: Not on file  . Attends Religious Services: Not on file  . Active Member of Clubs or Organizations: Not on file  . Attends Archivist Meetings: Not on file  . Marital Status: Not on file     Family History: The patient's family history includes Heart attack in his sister; Heart attack (age of onset: 71) in his father; Osteoarthritis in his father; Stroke (age of onset: 63) in his mother. There is no history of Colon cancer.  ROS:   Please see the history of present illness.    All other systems reviewed and are negative.  EKGs/Labs/Other Studies Reviewed:    The following studies were reviewed today: PPM interrogation   10/23/2019 PPM Interrogation Personally reviewed by me.  In the below episode there is in a sensed V sensed rhythm with a stable PR interval.  On the second row there is a prolongation in the PR interval at the onset of a tachycardia with simultaneous a and V activations.  This pattern is consistent with dual antegrade AV nodal physiology and AVNRT.  In this episode and a subsequent episode of a on the tachycardia, a premature atrial contraction terminates the tachycardia.  The tachycardia cycle length is approximately  451ms.    05/14/2019 Echo 1. Apical and apical septal hypokinesis.. Left ventricular ejection  fraction, by estimation, is 50 to 55%. The left ventricle has low normal  function. The left ventricle has no regional wall motion abnormalities.  Left ventricular diastolic parameters  are consistent with Grade I diastolic dysfunction (impaired relaxation).  Elevated left ventricular end-diastolic pressure.  2. Right ventricular systolic function is normal. The right ventricular  size is normal. There is normal pulmonary artery systolic pressure.  3. Left atrial size was moderately dilated.  4. The mitral valve is normal in structure. Trivial mitral valve  regurgitation. No evidence of mitral stenosis.  5. The aortic valve is tricuspid. Aortic valve regurgitation is not  visualized. No aortic stenosis is present.  6. The inferior vena cava is normal in size with greater than 50%  respiratory variability, suggesting right atrial pressure of 3 mmHg.    EKG:  The ekg ordered today demonstrates a-v sequential pacing.  Recent Labs: 04/16/2019: BUN 25; Creatinine, Ser 1.60; Hemoglobin 13.8; Platelets 189; Potassium 4.2; Sodium 137  Recent Lipid Panel Labs (Brief)  No results found for: CHOL, TRIG, HDL, CHOLHDL, VLDL, LDLCALC, LDLDIRECT    Physical Exam:    VS:  BP (!) 146/80   Pulse 61   Ht 5\' 11"  (1.803 m)   Wt 231 lb 12.8 oz (105.1 kg)   SpO2 95%   BMI 32.33 kg/m        Wt Readings from Last 3 Encounters:  11/12/19 231 lb 12.8 oz (105.1 kg)  09/20/19 224 lb 12.8 oz (102 kg)  04/18/19 229 lb 6.4 oz (104.1 kg)     GEN:  Well nourished, well developed in no acute distress HEENT: Normal NECK: No JVD; No carotid bruits LYMPHATICS: No lymphadenopathy CARDIAC: RRR, no murmurs, rubs, gallops RESPIRATORY:  Clear to auscultation without rales, wheezing or rhonchi  ABDOMEN: Soft, non-tender, non-distended MUSCULOSKELETAL:  No edema; No deformity  SKIN: Warm and  dry NEUROLOGIC:  Alert and oriented x 3 PSYCHIATRIC:  Normal affect   ASSESSMENT:    1. SVT (supraventricular tachycardia) ([place])   2. SSS (sick sinus syndrome) ([place])   3. Pacemaker    PLAN:    In order of problems listed above:  1. SVT Patient has symptomatic SVT on his pacemaker interrogations consistent with AVNRT.  Less likely diagnosis would be a junctional tachycardia although this would not initiate with a prolonged PR interval.  Recommend proceeding with EP study and ablation.  I discussed the risk, benefits and alternatives at length with the patient during today's visit.  Risk, benefits, and alternatives to EP study and radiofrequency ablation for SVT were also discussed in detail today. These risks include but are not limited to complete heart block, stroke, bleeding, vascular damage, tamponade, perforation, and death. Carto and ICE would be used for the procedure.    The patient and his wife will go home to think about the options of ablation vs increased metoprolol and let us know how they would like to proceed.   2. SSS s/p PPM Patient has a dual-chamber permanent pacemaker in situ that is well-functioning on today's interrogation.   ------------------------------------- I have seen, examined the patient, and reviewed the above assessment and plan.    Plan for EP study and possible ablation for recurrent symptomatic tachycardias.  Risk, benefits, and alternatives to EP study and radiofrequency ablation for SVT were also discussed in detail today. These risks include but are not limited to complete heart block, stroke, bleeding, vascular damage, tamponade, perforation, and death. The patient understands these risk and wishes to proceed.     Vickie Epley, MD 02/14/2020 10:19 AM

## 2020-02-14 NOTE — Progress Notes (Signed)
Ambulated in the hallway and to the bathroom to void. Tol well. No bleeding noted before or after ambulation.

## 2020-02-14 NOTE — Transfer of Care (Signed)
Immediate Anesthesia Transfer of Care Note  Patient: [name]  Procedure(s) Performed: SVT ABLATION (N/A )  Patient Location: Cath Lab  Anesthesia Type:MAC  Level of Consciousness: awake, alert  and oriented  Airway & Oxygen Therapy: Patient Spontanous Breathing  Post-op Assessment: Report given to RN, Post -op Vital signs reviewed and stable and Patient moving all extremities  Post vital signs: Reviewed and stable  Last Vitals:  Vitals Value Taken Time  BP 154/78 02/14/20 1208  Temp    Pulse 70 02/14/20 1208  Resp 14 02/14/20 1208  SpO2 100 % 02/14/20 1208  Vitals shown include unvalidated device data.  Last Pain:  Vitals:   02/14/20 0851  TempSrc:   PainSc: 0-No pain      Patients Stated Pain Goal: 2 (38/32/91 9166)  Complications: No complications documented.

## 2020-02-14 NOTE — Anesthesia Procedure Notes (Addendum)
Procedure Name: MAC Date/Time: 02/14/2020 10:36 AM Performed by: Rande Brunt, CRNA Pre-anesthesia Checklist: Patient identified, Emergency Drugs available, Suction available and Patient being monitored Patient Re-evaluated:Patient Re-evaluated prior to induction Oxygen Delivery Method: Simple face mask Induction Type: IV induction Placement Confirmation: positive ETCO2 and breath sounds checked- equal and bilateral Dental Injury: Teeth and Oropharynx as per pre-operative assessment

## 2020-02-14 NOTE — Discharge Instructions (Signed)

## 2020-02-14 NOTE — Anesthesia Preprocedure Evaluation (Addendum)
Anesthesia Evaluation  Patient identified by MRN, date of birth, ID band Patient awake    Reviewed: Allergy & Precautions, NPO status , Patient's Chart, lab work & pertinent test results  Airway Mallampati: III  TM Distance: >3 FB Neck ROM: Full    Dental  (+) Missing   Pulmonary sleep apnea ,    Pulmonary exam normal breath sounds clear to auscultation       Cardiovascular hypertension, Pt. on home beta blockers Normal cardiovascular exam+ pacemaker  Rhythm:Regular Rate:Normal     Neuro/Psych negative neurological ROS  negative psych ROS   GI/Hepatic negative GI ROS, Neg liver ROS,   Endo/Other  negative endocrine ROS  Renal/GU negative Renal ROS     Musculoskeletal  (+) Arthritis ,   Abdominal (+) + obese,   Peds  Hematology HLD   Anesthesia Other Findings SVT  Reproductive/Obstetrics                             Anesthesia Physical Anesthesia Plan  ASA: III  Anesthesia Plan: MAC   Post-op Pain Management:    Induction: Intravenous  PONV Risk Score and Plan: 1 and Ondansetron, Dexamethasone and Treatment may vary due to age or medical condition  Airway Management Planned: Nasal Cannula  Additional Equipment:   Intra-op Plan:   Post-operative Plan:   Informed Consent: I have reviewed the patients History and Physical, chart, labs and discussed the procedure including the risks, benefits and alternatives for the proposed anesthesia with the patient or authorized representative who has indicated his/her understanding and acceptance.     Dental advisory given  Plan Discussed with: CRNA  Anesthesia Plan Comments:        Anesthesia Quick Evaluation

## 2020-02-15 NOTE — Anesthesia Postprocedure Evaluation (Signed)
Anesthesia Post Note  Patient: [name]  Procedure(s) Performed: SVT ABLATION (N/A )     Patient location during evaluation: Cath Lab Anesthesia Type: MAC Level of consciousness: awake Pain management: pain level controlled Vital Signs Assessment: post-procedure vital signs reviewed and stable Respiratory status: spontaneous breathing, nonlabored ventilation, respiratory function stable and patient connected to nasal cannula oxygen Cardiovascular status: stable and blood pressure returned to baseline Postop Assessment: no apparent nausea or vomiting Anesthetic complications: no   No complications documented.  Last Vitals:  Vitals:   02/14/20 1521 02/14/20 1530  BP: (!) 141/65   Pulse: 69 75  Resp: 18 13  Temp:    SpO2: 100% 98%    Last Pain:  Vitals:   02/14/20 1334  TempSrc:   PainSc: 0-No pain                 Anuol[place]apo Mefferd P Amaurie Schreckengost

## 2020-02-16 ENCOUNTER — Other Ambulatory Visit: Payer: Self-pay | Admitting: Medical

## 2020-02-17 ENCOUNTER — Encounter (HOSPITAL_COMMUNITY): Payer: Self-pay | Admitting: Cardiology

## 2020-02-17 DIAGNOSIS — M205X1 Other deformities of toe(s) (acquired), right foot: Secondary | ICD-10-CM | POA: Diagnosis not present

## 2020-02-17 DIAGNOSIS — R238 Other skin changes: Secondary | ICD-10-CM | POA: Diagnosis not present

## 2020-02-17 DIAGNOSIS — M792 Neuralgia and neuritis, unspecified: Secondary | ICD-10-CM | POA: Diagnosis not present

## 2020-02-17 DIAGNOSIS — D2371 Other benign neoplasm of skin of right lower limb, including hip: Secondary | ICD-10-CM | POA: Diagnosis not present

## 2020-02-17 MED FILL — Cefazolin Sodium-Dextrose IV Solution 2 GM/100ML-4%: INTRAVENOUS | Qty: 100 | Status: AC

## 2020-02-17 NOTE — Progress Notes (Signed)
Spoke with wife/ pt drove to dr app

## 2020-02-17 NOTE — Telephone Encounter (Signed)
This is Dr. Georgina Quint pt

## 2020-02-19 NOTE — Telephone Encounter (Signed)
Rx has been sent to the pharmacy electronically. ° °

## 2020-02-20 DIAGNOSIS — I495 Sick sinus syndrome: Secondary | ICD-10-CM | POA: Diagnosis not present

## 2020-02-20 DIAGNOSIS — I471 Supraventricular tachycardia: Secondary | ICD-10-CM | POA: Diagnosis not present

## 2020-02-20 DIAGNOSIS — E782 Mixed hyperlipidemia: Secondary | ICD-10-CM | POA: Diagnosis not present

## 2020-02-20 DIAGNOSIS — R7303 Prediabetes: Secondary | ICD-10-CM | POA: Diagnosis not present

## 2020-02-20 DIAGNOSIS — M1000 Idiopathic gout, unspecified site: Secondary | ICD-10-CM | POA: Diagnosis not present

## 2020-02-25 DIAGNOSIS — R3912 Poor urinary stream: Secondary | ICD-10-CM | POA: Diagnosis not present

## 2020-02-25 DIAGNOSIS — N201 Calculus of ureter: Secondary | ICD-10-CM | POA: Diagnosis not present

## 2020-02-25 DIAGNOSIS — R3121 Asymptomatic microscopic hematuria: Secondary | ICD-10-CM | POA: Diagnosis not present

## 2020-03-18 ENCOUNTER — Other Ambulatory Visit: Payer: Self-pay

## 2020-03-18 ENCOUNTER — Ambulatory Visit (INDEPENDENT_AMBULATORY_CARE_PROVIDER_SITE_OTHER): Payer: Medicare Other | Admitting: Cardiology

## 2020-03-18 ENCOUNTER — Encounter: Payer: Self-pay | Admitting: Cardiology

## 2020-03-18 VITALS — BP 122/68 | HR 70 | Ht 71.0 in | Wt 234.2 lb

## 2020-03-18 DIAGNOSIS — I495 Sick sinus syndrome: Secondary | ICD-10-CM | POA: Diagnosis not present

## 2020-03-18 DIAGNOSIS — I471 Supraventricular tachycardia, unspecified: Secondary | ICD-10-CM

## 2020-03-18 DIAGNOSIS — Z950 Presence of cardiac pacemaker: Secondary | ICD-10-CM

## 2020-03-18 DIAGNOSIS — I441 Atrioventricular block, second degree: Secondary | ICD-10-CM | POA: Diagnosis not present

## 2020-03-18 LAB — CUP PACEART INCLINIC DEVICE CHECK
Battery Remaining Longevity: 125 mo
Battery Voltage: 3 V
Brady Statistic AP VP Percent: 64.35 %
Brady Statistic AP VS Percent: 0.17 %
Brady Statistic AS VP Percent: 33.61 %
Brady Statistic AS VS Percent: 1.88 %
Brady Statistic RA Percent Paced: 65.16 %
Brady Statistic RV Percent Paced: 97.95 %
Date Time Interrogation Session: 20220112170457
Implantable Lead Implant Date: 20190206
Implantable Lead Implant Date: 20190206
Implantable Lead Location: 753859
Implantable Lead Location: 753860
Implantable Lead Model: 5076
Implantable Lead Model: 5076
Implantable Pulse Generator Implant Date: 20190206
Lead Channel Impedance Value: 342 Ohm
Lead Channel Impedance Value: 399 Ohm
Lead Channel Impedance Value: 456 Ohm
Lead Channel Impedance Value: 494 Ohm
Lead Channel Pacing Threshold Amplitude: 0.75 V
Lead Channel Pacing Threshold Pulse Width: 0.4 ms
Lead Channel Sensing Intrinsic Amplitude: 3.1 mV
Lead Channel Sensing Intrinsic Amplitude: 7.4 mV
Lead Channel Setting Pacing Amplitude: 2 V
Lead Channel Setting Pacing Amplitude: 2.5 V
Lead Channel Setting Pacing Pulse Width: 0.4 ms
Lead Channel Setting Sensing Sensitivity: 0.9 mV

## 2020-03-18 MED ORDER — AMIODARONE HCL 200 MG PO TABS: ORAL_TABLET | ORAL | 3 refills | Status: DC

## 2020-03-18 NOTE — Patient Instructions (Addendum)
Medication Instructions:  Your physician has recommended you make the following change in your medication:   1.  START taking amiodarone 200 mg--  A.  Take one tablet by mouth twice a day for 14 days  B.  After 14 days REDUCE your amiodarone to ONE tablet by mouth daily   Labwork: You will get lab work today:  CMP, TSH and free T4  Testing/Procedures: None ordered.  Follow-Up: Your physician wants you to follow-up in: 6-8 weeks with Dr. Quentin Ore.     May 05, 2020 at 3:45 pm at the [facility] office  Remote monitoring is used to monitor your Pacemaker from home. This monitoring reduces the number of office visits required to check your device to one time per year. It allows [place] to keep an eye on the functioning of your device to ensure it is working properly. You are scheduled for a device check from home on 04/22/2020. You may send your transmission at any time that day. If you have a wireless device, the transmission will be sent automatically. After your physician reviews your transmission, you will receive a postcard with your next transmission date.  Any Other Special Instructions Will Be Listed Below (If Applicable).  If you need a refill on your cardiac medications before your next appointment, please call your pharmacy.   Amiodarone tablets What is this medicine? AMIODARONE (a MEE oh da rone) is an antiarrhythmic drug. It helps make your heart beat regularly. Because of the side effects caused by this medicine, it is only used when other medicines have not worked. It is usually used for heartbeat problems that may be life threatening. This medicine may be used for other purposes; ask your health care provider or pharmacist if you have questions. COMMON BRAND NAME(S): Cordarone, Pacerone What should I tell my health care provider before I take this medicine? They need to know if you have any of these conditions:  liver disease  lung disease  other heart problems  thyroid  disease  an unusual or allergic reaction to amiodarone, iodine, other medicines, foods, dyes, or preservatives  pregnant or trying to get pregnant  breast-feeding How should I use this medicine? Take this medicine by mouth with a glass of water. Follow the directions on the prescription label. You can take this medicine with or without food. However, you should always take it the same way each time. Take your doses at regular intervals. Do not take your medicine more often than directed. Do not stop taking except on the advice of your doctor or health care professional. A special MedGuide will be given to you by the pharmacist with each prescription and refill. Be sure to read this information carefully each time. Talk to your pediatrician regarding the use of this medicine in children. Special care may be needed. Overdosage: If you think you have taken too much of this medicine contact a poison control center or emergency room at once. NOTE: This medicine is only for you. Do not share this medicine with others. What if I miss a dose? If you miss a dose, take it as soon as you can. If it is almost time for your next dose, take only that dose. Do not take double or extra doses. What may interact with this medicine? Do not take this medicine with any of the following medications:  abarelix  apomorphine  arsenic trioxide  certain antibiotics like erythromycin, gemifloxacin, levofloxacin, pentamidine  certain medicines for depression like amoxapine, tricyclic antidepressants  certain medicines for fungal infections like fluconazole, itraconazole, ketoconazole, posaconazole, voriconazole  certain medicines for irregular heart beat like disopyramide, dronedarone, ibutilide, propafenone, sotalol  certain medicines for malaria like chloroquine, halofantrine  cisapride  droperidol  haloperidol  [place]  maprotiline  methadone  phenothiazines like chlorpromazine, mesoridazine,  thioridazine  pimozide  ranolazine  red yeast rice  vardenafil This medicine may also interact with the following medications:  antiviral medicines for HIV or AIDS  certain medicines for blood pressure, heart disease, irregular heart beat  certain medicines for cholesterol like atorvastatin, cerivastatin, lovastatin, simvastatin  certain medicines for hepatitis C like sofosbuvir and ledipasvir; sofosbuvir  certain medicines for seizures like phenytoin  certain medicines for thyroid problems  certain medicines that treat or prevent blood clots like warfarin  cholestyramine  cimetidine  clopidogrel  cyclosporine  dextromethorphan  diuretics  dofetilide  fentanyl  general anesthetics  grapefruit juice  lidocaine  loratadine  methotrexate  other medicines that prolong the QT interval (cause an abnormal heart rhythm)  procainamide  quinidine  rifabutin, rifampin, or rifapentine  St. John's Wort  trazodone  ziprasidone This list may not describe all possible interactions. Give your health care provider a list of all the medicines, herbs, non-prescription drugs, or dietary supplements you use. Also tell them if you smoke, drink alcohol, or use illegal drugs. Some items may interact with your medicine. What should I watch for while using this medicine? Your condition will be monitored closely when you first begin therapy. Often, this drug is first started in a hospital or other monitored health care setting. Once you are on maintenance therapy, visit your doctor or health care professional for regular checks on your progress. Because your condition and use of this medicine carry some risk, it is a good idea to carry an identification card, necklace or bracelet with details of your condition, medications, and doctor or health care professional. Dennis Bast may get drowsy or dizzy. Do not drive, use machinery, or do anything that needs mental alertness until you know  how this medicine affects you. Do not stand or sit up quickly, especially if you are an older patient. This reduces the risk of dizzy or fainting spells. This medicine can make you more sensitive to the sun. Keep out of the sun. If you cannot avoid being in the sun, wear protective clothing and use sunscreen. Do not use sun lamps or tanning beds/booths. You should have regular eye exams before and during treatment. Call your doctor if you have blurred vision, see halos, or your eyes become sensitive to light. Your eyes may get dry. It may be helpful to use a lubricating eye solution or artificial tears solution. If you are going to have surgery or a procedure that requires contrast dyes, tell your doctor or health care professional that you are taking this medicine. What side effects may I notice from receiving this medicine? Side effects that you should report to your doctor or health care professional as soon as possible:  allergic reactions like skin rash, itching or hives, swelling of the face, lips, or tongue  blue-gray coloring of the skin  blurred vision, seeing blue green halos, increased sensitivity of the eyes to light  breathing problems  chest pain  dark urine  fast, irregular heartbeat  feeling faint or light-headed  intolerance to heat or cold  nausea or vomiting  pain and swelling of the scrotum  pain, tingling, numbness in feet, hands  redness, blistering, peeling or loosening of the  skin, including inside the mouth  spitting up blood  stomach pain  sweating  unusual or uncontrolled movements of body  unusually weak or tired  weight gain or loss  yellowing of the eyes or skin Side effects that usually do not require medical attention (report to your doctor or health care professional if they continue or are bothersome):  change in sex drive or performance  constipation  dizziness  headache  loss of appetite  trouble sleeping This list may not  describe all possible side effects. Call your doctor for medical advice about side effects. You may report side effects to FDA at 1-800-FDA-1088. Where should I keep my medicine? Keep out of the reach of children. Store at room temperature between 20 and 25 degrees C (68 and 77 degrees F). Protect from light. Keep container tightly closed. Throw away any unused medicine after the expiration date. NOTE: This sheet is a summary. It may not cover all possible information. If you have questions about this medicine, talk to your doctor, pharmacist, or health care provider.  2021 Elsevier/Gold Standard (2018-01-24 13:44:04)

## 2020-03-18 NOTE — Progress Notes (Signed)
Electrophysiology Office Follow up Visit Note:    Date:  03/18/2020   ID:  KRISTI HYER, DOB [birth year], MRN [id number]  PCP:  Merrilee Seashore, MD  [facility] Cardiologist:  Sanda Klein, MD  [facility] Electrophysiologist:  Vickie Epley, MD    Interval History:    Jared TRETTIN is a 80 y.o. male who presents for a follow up visit after recent EP study on February 14, 2020.  During the EP study, the patient was noted to have very poor antegrade conduction and easy to induce AVNRT.  Given the poor antegrade fast pathway conduction and his overall low burden of symptoms, I elected not to ablate the slow pathway.  Today he presents in clinic to follow-up with his procedure.  He tells me he has been doing very well since his EP study.  He is still very active working outside and participating in church activities.  His groin sites healed well after the procedure.  No syncope or presyncope.    Past Medical History:  Diagnosis Date  . Arthritis    "probably; hands" (04/12/2017)  . High cholesterol   . History of gout   . Hypertension   . OSA (obstructive sleep apnea)    per pt noncompliant CPAP has not use it since 2015  . Presence of permanent cardiac pacemaker 04/12/2017   Dual Chamber  . Wears dentures    upper    Past Surgical History:  Procedure Laterality Date  . CHOLECYSTECTOMY N/A 08/19/2017   Procedure: LAPAROSCOPIC ASSISTED OPEN CHOLECYSTECTOMY WITH INTRAOPERATIVE CHOLANGIOGRAM;  Surgeon: Armandina Gemma, MD;  Location: [facility];  Service: General;  Laterality: N/A;  . ELBOW BURSA SURGERY Left 07-21-2009   extensive bursectomy/ tenosynovectomy/ ulnar nerve release  . EUS N/A 08/18/2017   Procedure: ESOPHAGEAL ENDOSCOPIC ULTRASOUND (EUS) RADIAL;  Surgeon: Milus Banister, MD;  [facility]: [facility];  Service: Endoscopy;  Laterality: N/A;  . HAMMER TOE SURGERY Right 2015  . HAMMER TOE SURGERY Right 12/11/2014   Procedure: 4th HAMMER TOE CORRECTION,  EXCISION LESION 3rd TOE RIGHT FOOT;  Surgeon: Rosemary Holms, DPM;  Location: [facility];  Service: Podiatry;  Laterality: Right;  . INSERT / REPLACE / REMOVE PACEMAKER  04/12/2017   Dual Chamber  . MICROLARYNGOSCOPY  06-07-2006   w/  Excision bilateral vocal cord lesions (bilateral benign nodules)  . PACEMAKER IMPLANT N/A 04/12/2017   Procedure: PACEMAKER IMPLANT - Dual Chamber;  Surgeon: Sanda Klein, MD;  Location: [place] [facility];  Service: Cardiovascular;  Laterality: N/A;  . SVT ABLATION N/A 02/14/2020   Procedure: SVT ABLATION;  Surgeon: Vickie Epley, MD;  Location: [place] [facility];  Service: Cardiovascular;  Laterality: N/A;    Current Medications: Current Meds  Medication Sig  . aspirin EC 81 MG tablet Take 81 mg by mouth daily.  Marland Kitchen atorvastatin (LIPITOR) 20 MG tablet Take 20 mg by mouth daily.  . fluticasone (FLONASE) 50 MCG/ACT nasal spray Place 1 spray into both nostrils daily as needed for rhinitis.   Marland Kitchen losartan (COZAAR) 50 MG tablet Take 1 tablet (50 mg total) by mouth daily.  . Multiple Vitamin (MULTIVITAMIN WITH MINERALS) TABS tablet Take 1 tablet by mouth daily.  . nitroGLYCERIN (NITROSTAT) 0.4 MG SL tablet Place 1 tablet (0.4 mg total) under the tongue every 5 (five) minutes as needed for chest pain.  . potassium chloride SA (KLOR-CON) 20 MEQ tablet Take 20 mEq by mouth daily.  . tamsulosin (FLOMAX) 0.4 MG CAPS capsule  Take 0.4 mg by mouth daily.  Marland Kitchen triamcinolone cream (KENALOG) 0.5 % Apply 1 application topically in the morning and at bedtime.  . [DISCONTINUED] losartan (COZAAR) 100 MG tablet Take 100 mg by mouth daily.  . [DISCONTINUED] metoprolol succinate (TOPROL-XL) 25 MG 24 hr tablet TAKE 1 TABLET BY MOUTH EVERY DAY     Allergies:   Patient has no known allergies.   Social History   Socioeconomic History  . Marital status: Married    Spouse name: Not on file  . Number of children: Not on file  . Years of education: Not on file  .  Highest education level: Not on file  Occupational History  . Not on file  Tobacco Use  . Smoking status: Never Smoker  . Smokeless tobacco: Never Used  Vaping Use  . Vaping Use: Never used  Substance and Sexual Activity  . Alcohol use: Yes    Alcohol/week: 2.0 standard drinks    Types: 2 Standard drinks or equivalent per week  . Drug use: No  . Sexual activity: Yes  Other Topics Concern  . Not on file  Social History Narrative  . Not on file   Social Determinants of Health   Financial Resource Strain: Not on file  Food Insecurity: Not on file  Transportation Needs: Not on file  Physical Activity: Not on file  Stress: Not on file  Social Connections: Not on file     Family History: The patient's family history includes Heart attack in his sister; Heart attack (age of onset: 63) in his father; Osteoarthritis in his father; Stroke (age of onset: 41) in his mother. There is no history of Colon cancer.  ROS:   Please see the history of present illness.    All other systems reviewed and are negative.  EKGs/Labs/Other Studies Reviewed:    The following studies were reviewed today:  March 18, 2020 device interrogation in clinic personally reviewed shows frequent episodes lasting seconds.  During sensing and examination of his presenting rhythm during today's device interrogation there is evidence of typical AV nodal echo beats.  He has daily parameters. Patient activity 6 to 8 hours/day 40 episodes of SVT ventricular pacing 90% of the time   EKG:  The ekg ordered today demonstrates AV sequential pacing  Recent Labs: 02/12/2020: BUN 17; Creatinine, Ser 1.14; Hemoglobin 14.7; Platelets 208; Potassium 4.5; Sodium 139  Recent Lipid Panel No results found for: CHOL, TRIG, HDL, CHOLHDL, VLDL, LDLCALC, LDLDIRECT  Physical Exam:    VS:  BP 122/68   Pulse 70   Ht 5\' 11"  (1.803 m)   Wt 234 lb 3.2 oz (106.2 kg)   SpO2 95%   BMI 32.66 kg/m     Wt Readings from Last 3  Encounters:  03/18/20 234 lb 3.2 oz (106.2 kg)  02/14/20 228 lb (103.4 kg)  11/12/19 231 lb 12.8 oz (105.1 kg)     GEN:  Well nourished, well developed in no acute distress HEENT: Normal NECK: No JVD; No carotid bruits LYMPHATICS: No lymphadenopathy CARDIAC: RRR, no murmurs, rubs, gallops RESPIRATORY:  Clear to auscultation without rales, wheezing or rhonchi  ABDOMEN: Soft, non-tender, non-distended MUSCULOSKELETAL:  No edema; No deformity  SKIN: Warm and dry NEUROLOGIC:  Alert and oriented x 3 PSYCHIATRIC:  Normal affect   ASSESSMENT:    1. SVT (supraventricular tachycardia) ([place])   2. SSS (sick sinus syndrome) ([place])   3. Pacemaker    PLAN:    In order of problems listed above:  1. Frequent episodes of AVNRT Patient with fairly frequent episodes of an asymptomatic AVNRT by his device interrogation.  Patient is now post EP study which demonstrated frequent typical AV nodal echo beats given during sinus rhythm.  His antegrade fast pathway conduction was very poor and so ablation of the slow pathway was not pursued during the initial EP study.  I would like to try to suppress the AVNRT to see if we can avoid these atrial high rate episodes is at all possible.  After discussing various antiarrhythmic options during today's visit, we elected to initiate amiodarone.  He will take 200 mg by mouth twice a day for 2 weeks and then reduce the dose to 200 mg once a day.  We will get a complete metabolic profile, TSH and free T4 during today's visit.  I will plan on seeing him back in 6 to 8 weeks to see how he is doing and to reassess the burden of these arrhythmias on his device interrogation.  If we have seen no benefit, we will stop the amiodarone in the morning.  If we have seen a reduction in the episodes, we will plan on continuing the medication.  2.  Sick sinus syndrome post permanent pacemaker Device functioning well.  Now with a high burden of atrial and ventricular pacing.  Given his  reliance on a permanent pacemaker which has increased steadily over the recent past, could always consider a retrograde fast pathway ablation if the experiences recurrence of rapidly conducting SVT.  It is very likely that with a retrograde fast pathway ablation he would have complete heart block and 100% dependence on his pacemaker.   Medication Adjustments/Labs and Tests Ordered: Current medicines are reviewed at length with the patient today.  Concerns regarding medicines are outlined above.  Orders Placed This Encounter  Procedures  . EKG 12-Lead   No orders of the defined types were placed in this encounter.    Signed, Lars Mage, MD, [facility]  03/18/2020 4:47 PM    Electrophysiology [place]

## 2020-03-19 LAB — COMPREHENSIVE METABOLIC PANEL
ALT: 24 IU/L (ref 0–44)
AST: 26 IU/L (ref 0–40)
Albumin/Globulin Ratio: 1.7 (ref 1.2–2.2)
Albumin: 4.3 g/dL (ref 3.7–4.7)
Alkaline Phosphatase: 75 IU/L (ref 44–121)
BUN/Creatinine Ratio: 12 (ref 10–24)
BUN: 15 mg/dL (ref 8–27)
Bilirubin Total: 0.4 mg/dL (ref 0.0–1.2)
CO2: 19 mmol/L — ABNORMAL LOW (ref 20–29)
Calcium: 9.3 mg/dL (ref 8.6–10.2)
Chloride: 106 mmol/L (ref 96–106)
Creatinine, Ser: 1.22 mg/dL (ref 0.76–1.27)
GFR calc Af Amer: 65 mL/min/{1.73_m2} (ref >59)
GFR calc non Af Amer: 56 mL/min/{1.73_m2} — ABNORMAL LOW (ref >59)
Globulin, Total: 2.5 g/dL (ref 1.5–4.5)
Glucose: 122 mg/dL — ABNORMAL HIGH (ref 65–99)
Potassium: 4.5 mmol/L (ref 3.5–5.2)
Sodium: 141 mmol/L (ref 134–144)
Total Protein: 6.8 g/dL (ref 6.0–8.5)

## 2020-03-19 LAB — TSH: TSH: 1.27 u[IU]/mL (ref 0.450–4.500)

## 2020-03-19 LAB — T4, FREE: Free T4: 1 ng/dL (ref 0.82–1.77)

## 2020-03-30 DIAGNOSIS — D2371 Other benign neoplasm of skin of right lower limb, including hip: Secondary | ICD-10-CM | POA: Diagnosis not present

## 2020-03-30 DIAGNOSIS — M792 Neuralgia and neuritis, unspecified: Secondary | ICD-10-CM | POA: Diagnosis not present

## 2020-03-30 DIAGNOSIS — M205X1 Other deformities of toe(s) (acquired), right foot: Secondary | ICD-10-CM | POA: Diagnosis not present

## 2020-03-30 DIAGNOSIS — I739 Peripheral vascular disease, unspecified: Secondary | ICD-10-CM | POA: Diagnosis not present

## 2020-04-04 ENCOUNTER — Emergency Department (HOSPITAL_COMMUNITY): Payer: Medicare Other

## 2020-04-04 ENCOUNTER — Emergency Department (HOSPITAL_COMMUNITY)
Admission: EM | Admit: 2020-04-04 | Discharge: 2020-04-04 | Disposition: A | Discharge status: Home / Self Care | Payer: Medicare Other | Attending: Emergency Medicine | Admitting: Emergency Medicine

## 2020-04-04 ENCOUNTER — Other Ambulatory Visit: Payer: Self-pay

## 2020-04-04 ENCOUNTER — Encounter (HOSPITAL_COMMUNITY): Payer: Self-pay | Admitting: Emergency Medicine

## 2020-04-04 DIAGNOSIS — M79661 Pain in right lower leg: Secondary | ICD-10-CM | POA: Diagnosis present

## 2020-04-04 DIAGNOSIS — M7989 Other specified soft tissue disorders: Secondary | ICD-10-CM | POA: Diagnosis not present

## 2020-04-04 DIAGNOSIS — I517 Cardiomegaly: Secondary | ICD-10-CM | POA: Diagnosis not present

## 2020-04-04 DIAGNOSIS — I10 Essential (primary) hypertension: Secondary | ICD-10-CM | POA: Insufficient documentation

## 2020-04-04 DIAGNOSIS — M47814 Spondylosis without myelopathy or radiculopathy, thoracic region: Secondary | ICD-10-CM | POA: Diagnosis not present

## 2020-04-04 DIAGNOSIS — Z7982 Long term (current) use of aspirin: Secondary | ICD-10-CM | POA: Diagnosis not present

## 2020-04-04 DIAGNOSIS — Z950 Presence of cardiac pacemaker: Secondary | ICD-10-CM | POA: Diagnosis not present

## 2020-04-04 DIAGNOSIS — M19012 Primary osteoarthritis, left shoulder: Secondary | ICD-10-CM | POA: Diagnosis not present

## 2020-04-04 DIAGNOSIS — L03115 Cellulitis of right lower limb: Secondary | ICD-10-CM | POA: Insufficient documentation

## 2020-04-04 LAB — URINALYSIS, ROUTINE W REFLEX MICROSCOPIC
Bilirubin Urine: NEGATIVE
Glucose, UA: NEGATIVE mg/dL
Hgb urine dipstick: NEGATIVE
Ketones, ur: NEGATIVE mg/dL
Leukocytes,Ua: NEGATIVE
Nitrite: NEGATIVE
Protein, ur: NEGATIVE mg/dL
Specific Gravity, Urine: 1.021 (ref 1.005–1.030)
pH: 5 (ref 5.0–8.0)

## 2020-04-04 LAB — COMPREHENSIVE METABOLIC PANEL
ALT: 24 U/L (ref 0–44)
AST: 27 U/L (ref 15–41)
Albumin: 3.8 g/dL (ref 3.5–5.0)
Alkaline Phosphatase: 49 U/L (ref 38–126)
Anion gap: 10 (ref 5–15)
BUN: 19 mg/dL (ref 8–23)
CO2: 20 mmol/L — ABNORMAL LOW (ref 22–32)
Calcium: 8.9 mg/dL (ref 8.9–10.3)
Chloride: 106 mmol/L (ref 98–111)
Creatinine, Ser: 1.35 mg/dL — ABNORMAL HIGH (ref 0.61–1.24)
GFR, Estimated: 54 mL/min — ABNORMAL LOW (ref >60)
Glucose, Bld: 105 mg/dL — ABNORMAL HIGH (ref 70–99)
Potassium: 3.9 mmol/L (ref 3.5–5.1)
Sodium: 136 mmol/L (ref 135–145)
Total Bilirubin: 0.6 mg/dL (ref 0.3–1.2)
Total Protein: 7.3 g/dL (ref 6.5–8.1)

## 2020-04-04 LAB — CBC WITH DIFFERENTIAL/PLATELET
Abs Immature Granulocytes: 0.03 10*3/uL (ref 0.00–0.07)
Basophils Absolute: 0 10*3/uL (ref 0.0–0.1)
Basophils Relative: 0 %
Eosinophils Absolute: 0.1 10*3/uL (ref 0.0–0.5)
Eosinophils Relative: 1 %
HCT: 43.7 % (ref 39.0–52.0)
Hemoglobin: 14.3 g/dL (ref 13.0–17.0)
Immature Granulocytes: 0 %
Lymphocytes Relative: 20 %
Lymphs Abs: 1.7 10*3/uL (ref 0.7–4.0)
MCH: 30.2 pg (ref 26.0–34.0)
MCHC: 32.7 g/dL (ref 30.0–36.0)
MCV: 92.2 fL (ref 80.0–100.0)
Monocytes Absolute: 0.9 10*3/uL (ref 0.1–1.0)
Monocytes Relative: 10 %
Neutro Abs: 5.7 10*3/uL (ref 1.7–7.7)
Neutrophils Relative %: 69 %
Platelets: 155 10*3/uL (ref 150–400)
RBC: 4.74 MIL/uL (ref 4.22–5.81)
RDW: 14.1 % (ref 11.5–15.5)
WBC: 8.4 10*3/uL (ref 4.0–10.5)
nRBC: 0 % (ref 0.0–0.2)

## 2020-04-04 MED ORDER — CEPHALEXIN 500 MG PO CAPS
500.0000 mg | ORAL_CAPSULE | Freq: Once | ORAL | Status: AC
  Administered 2020-04-04: 500 mg via ORAL
  Filled 2020-04-04: qty 1

## 2020-04-04 MED ORDER — CEPHALEXIN 500 MG PO CAPS: 500.0000 mg | ORAL_CAPSULE | Freq: Four times a day (QID) | ORAL | 0 refills | Status: AC

## 2020-04-04 NOTE — Discharge Instructions (Signed)
Call your primary care doctor or specialist as discussed in the next 2-3 days.   Return immediately back to the ER if:  Your symptoms worsen within the next 12-24 hours. You develop new symptoms such as new fevers, persistent vomiting, new pain, shortness of breath, or new weakness or numbness, or if you have any other concerns.  

## 2020-04-04 NOTE — ED Notes (Signed)
Blue top tube sent to lab. 

## 2020-04-04 NOTE — ED Triage Notes (Signed)
Patient woke up today with R lower leg pain, it is tight, reddened area noted from mid-ankle to knee, warm and swollen. Reports he got his toenail cut on his R and L foot last week. Pain with ambulation.

## 2020-04-04 NOTE — ED Provider Notes (Signed)
[name] DEPT Provider Note   CSN: [id number] Arrival date & time: 04/04/20  1752     History Chief Complaint  Patient presents with  . Leg Pain    [name] is a 80 y.o. male.  Patient presents with concern of increased redness pain to the right front shin region.  Symptoms been ongoing for 3 days.  Denies any fevers or chills at home.  No fall or trauma no vomiting or diarrhea.        Past Medical History:  Diagnosis Date  . Arthritis    "probably; hands" (04/12/2017)  . High cholesterol   . History of gout   . Hypertension   . OSA (obstructive sleep apnea)    per pt noncompliant CPAP has not use it since 2015  . Presence of permanent cardiac pacemaker 04/12/2017   Dual Chamber  . Wears dentures    upper    Patient Active Problem List   Diagnosis Date Noted  . Hypercholesteremia 08/01/2018  . Acute biliary pancreatitis 08/17/2017  . Pacemaker 04/12/2017  . Syncope and collapse 01/23/2017  . Second degree atrioventricular block, Mobitz (type) I 01/23/2017  . SSS (sick sinus syndrome) ([place]) 01/23/2017  . SVT (supraventricular tachycardia) ([place]) 01/23/2017  . Other fatigue 01/23/2017  . Chest pain 12/28/2016  . Essential hypertension 12/28/2016  . BRBPR (bright red blood per rectum) 11/04/2010  . Mild renal insufficiency 11/04/2010  . Sleep apnea 11/04/2010    Past Surgical History:  Procedure Laterality Date  . CHOLECYSTECTOMY N/A 08/19/2017   Procedure: LAPAROSCOPIC ASSISTED OPEN CHOLECYSTECTOMY WITH INTRAOPERATIVE CHOLANGIOGRAM;  Surgeon: Armandina Gemma, MD;  [facility];  Service: General;  Laterality: N/A;  . ELBOW BURSA SURGERY Left 07-21-2009   extensive bursectomy/ tenosynovectomy/ ulnar nerve release  . EUS N/A 08/18/2017   Procedure: ESOPHAGEAL ENDOSCOPIC ULTRASOUND (EUS) RADIAL;  Surgeon: Milus Banister, MD;  [facility];  Service: Endoscopy;  Laterality: N/A;  . HAMMER TOE SURGERY Right 2015   . HAMMER TOE SURGERY Right 12/11/2014   Procedure: 4th HAMMER TOE CORRECTION, EXCISION LESION 3rd TOE RIGHT FOOT;  Surgeon: Rosemary Holms, DPM;  Location: [place] [facility];  Service: Podiatry;  Laterality: Right;  . INSERT / REPLACE / REMOVE PACEMAKER  04/12/2017   Dual Chamber  . MICROLARYNGOSCOPY  06-07-2006   w/  Excision bilateral vocal cord lesions (bilateral benign nodules)  . PACEMAKER IMPLANT N/A 04/12/2017   Procedure: PACEMAKER IMPLANT - Dual Chamber;  Surgeon: Sanda Klein, MD;  Location: [facility] [place] [facility];  Service: Cardiovascular;  Laterality: N/A;  . SVT ABLATION N/A 02/14/2020   Procedure: SVT ABLATION;  Surgeon: Vickie Epley, MD;  Location: [facility];  Service: Cardiovascular;  Laterality: N/A;       Family History  Problem Relation Age of Onset  . Stroke Mother 40       had gangrene of toe, then later "stroke" per patient  . Osteoarthritis Father   . Heart attack Father 73  . Heart attack Sister        sudden cardiac arrest  . Colon cancer Neg Hx     Social History   Tobacco Use  . Smoking status: Never Smoker  . Smokeless tobacco: Never Used  Vaping Use  . Vaping Use: Never used  Substance Use Topics  . Alcohol use: Yes    Alcohol/week: 2.0 standard drinks    Types: 2 Standard drinks or equivalent per week  . Drug use: No  Home Medications Prior to Admission medications   Medication Sig Start Date End Date Taking? Authorizing Provider  cephALEXin (KEFLEX) 500 MG capsule Take 1 capsule (500 mg total) by mouth 4 (four) times daily for 7 days. 04/04/20 04/11/20 Yes Luna Fuse, MD  amiodarone (PACERONE) 200 MG tablet Take 1 tablet (200 mg total) by mouth 2 (two) times daily for 14 days, THEN 1 tablet (200 mg total) daily. 03/18/20 03/27/21  Vickie Epley, MD  aspirin EC 81 MG tablet Take 81 mg by mouth daily.    [provider]  atorvastatin (LIPITOR) 20 MG tablet Take 20 mg by mouth daily.    [provider]  fluticasone (FLONASE) 50 MCG/ACT nasal spray Place 1 spray into both nostrils daily as needed for rhinitis.  09/01/19   [provider]  losartan (COZAAR) 50 MG tablet Take 1 tablet (50 mg total) by mouth daily. 02/19/20   Kroeger, Lorelee Cover., PA-C  Multiple Vitamin (MULTIVITAMIN WITH MINERALS) TABS tablet Take 1 tablet by mouth daily.    [provider]  nitroGLYCERIN (NITROSTAT) 0.4 MG SL tablet Place 1 tablet (0.4 mg total) under the tongue every 5 (five) minutes as needed for chest pain. 08/01/18 02/06/20  Croitoru, Mihai, MD  potassium chloride SA (KLOR-CON) 20 MEQ tablet Take 20 mEq by mouth daily. 12/14/18   [provider]  tamsulosin (FLOMAX) 0.4 MG CAPS capsule Take 0.4 mg by mouth daily. 04/03/19   [provider]  triamcinolone cream (KENALOG) 0.5 % Apply 1 application topically in the morning and at bedtime. 02/05/20   [provider]    Allergies    Patient has no known allergies.  Review of Systems   Review of Systems  Constitutional: Negative for fever.  HENT: Negative for ear pain and sore throat.   Eyes: Negative for pain.  Respiratory: Negative for cough.   Cardiovascular: Negative for chest pain.  Gastrointestinal: Negative for abdominal pain.  Genitourinary: Negative for flank pain.  Musculoskeletal: Negative for back pain.  Skin: Positive for rash. Negative for color change.  Neurological: Negative for syncope.  All other systems reviewed and are negative.   Physical Exam Updated Vital Signs BP (!) 165/88 (BP Location: Left Arm)   Pulse 71   Temp 98.3 F (36.8 C) (Oral)   Resp 18   Ht 5\' 11"  (1.803 m)   Wt 106.6 kg   SpO2 99%   BMI 32.78 kg/m   Physical Exam Constitutional:      General: He is not in acute distress.    Appearance: He is well-developed.  HENT:     Head: Normocephalic.     Nose: Nose normal.  Eyes:     Extraocular Movements: Extraocular movements intact.  Cardiovascular:     Rate and  Rhythm: Normal rate.  Pulmonary:     Effort: Pulmonary effort is normal.  Skin:    Coloration: Skin is not jaundiced.     Comments: Increased redness and increased warmth of the right shin.  No swelling of the calf.  No tenderness to the calf.  Neurological:     Mental Status: He is alert. Mental status is at baseline.     ED Results / Procedures / Treatments   Labs (all labs ordered are listed, but only abnormal results are displayed) Labs Reviewed  COMPREHENSIVE METABOLIC PANEL - Abnormal; Notable for the following components:      Result Value   CO2 20 (*)    Glucose, Bld 105 (*)  Creatinine, Ser 1.35 (*)    GFR, Estimated 54 (*)    All other components within normal limits  CBC WITH DIFFERENTIAL/PLATELET  URINALYSIS, ROUTINE W REFLEX MICROSCOPIC    EKG None  Radiology DG Chest 2 View  Result Date: 04/04/2020 CLINICAL DATA:  Right lower leg pain, redness, swelling and increased warmth. EXAM: CHEST - 2 VIEW COMPARISON:  05/02/2019 FINDINGS: Borderline enlarged cardiac silhouette. Mildly tortuous and calcified thoracic aorta. Clear lungs with normal vascularity. Stable left subclavian bipolar pacemaker leads. Thoracic spine degenerative changes. Mild left glenohumeral joint degenerative changes. IMPRESSION: No acute abnormality. Electronically Signed   By: Claudie Revering M.D.   On: 04/04/2020 18:53    Procedures Procedures   Medications Ordered in ED Medications  cephALEXin (KEFLEX) capsule 500 mg (has no administration in time range)    ED Course  I have reviewed the triage vital signs and the nursing notes.  Pertinent labs & imaging results that were available during my care of the patient were reviewed by me and considered in my medical decision making (see chart for details).    MDM Rules/Calculators/A&P                          Patient presents with presentation consistent with possible cellulitis of the right anterior shin.  DVT was considered but area of  involvement appears to be the anterior portion of the right lower extremity and not the posterior.  Given a prescription of antibiotics to trial on an outpatient basis.  Advise close follow-up with his doctor in 2 or 3 days.  Advised immediate return to the ER if he has fevers worsening symptoms increased redness or any additional concerns. Final Clinical Impression(s) / ED Diagnoses Final diagnoses:  Cellulitis of right lower extremity    Rx / DC Orders ED Discharge Orders         Ordered    cephALEXin (KEFLEX) 500 MG capsule  4 times daily        04/04/20 2017           Luna Fuse, MD 04/04/20 2018

## 2020-04-06 DIAGNOSIS — L03115 Cellulitis of right lower limb: Secondary | ICD-10-CM | POA: Diagnosis not present

## 2020-04-08 DIAGNOSIS — D2371 Other benign neoplasm of skin of right lower limb, including hip: Secondary | ICD-10-CM | POA: Diagnosis not present

## 2020-04-08 DIAGNOSIS — M792 Neuralgia and neuritis, unspecified: Secondary | ICD-10-CM | POA: Diagnosis not present

## 2020-04-08 DIAGNOSIS — M205X1 Other deformities of toe(s) (acquired), right foot: Secondary | ICD-10-CM | POA: Diagnosis not present

## 2020-04-08 DIAGNOSIS — I70293 Other atherosclerosis of native arteries of extremities, bilateral legs: Secondary | ICD-10-CM | POA: Diagnosis not present

## 2020-04-21 DIAGNOSIS — R21 Rash and other nonspecific skin eruption: Secondary | ICD-10-CM | POA: Diagnosis not present

## 2020-04-21 DIAGNOSIS — L301 Dyshidrosis [pompholyx]: Secondary | ICD-10-CM | POA: Diagnosis not present

## 2020-04-22 ENCOUNTER — Ambulatory Visit (INDEPENDENT_AMBULATORY_CARE_PROVIDER_SITE_OTHER): Payer: Medicare Other

## 2020-04-22 DIAGNOSIS — I495 Sick sinus syndrome: Secondary | ICD-10-CM | POA: Diagnosis not present

## 2020-04-22 LAB — CUP PACEART REMOTE DEVICE CHECK
Battery Remaining Longevity: 122 mo
Battery Voltage: 3 V
Brady Statistic AP VP Percent: 53.18 %
Brady Statistic AP VS Percent: 0.02 %
Brady Statistic AS VP Percent: 45.59 %
Brady Statistic AS VS Percent: 1.21 %
Brady Statistic RA Percent Paced: 53.65 %
Brady Statistic RV Percent Paced: 98.77 %
Date Time Interrogation Session: 20220216011522
Implantable Lead Implant Date: 20190206
Implantable Lead Implant Date: 20190206
Implantable Lead Location: 753859
Implantable Lead Location: 753860
Implantable Lead Model: 5076
Implantable Lead Model: 5076
Implantable Pulse Generator Implant Date: 20190206
Lead Channel Impedance Value: 323 Ohm
Lead Channel Impedance Value: 380 Ohm
Lead Channel Impedance Value: 437 Ohm
Lead Channel Impedance Value: 475 Ohm
Lead Channel Pacing Threshold Amplitude: 0.625 V
Lead Channel Pacing Threshold Amplitude: 0.625 V
Lead Channel Pacing Threshold Pulse Width: 0.4 ms
Lead Channel Pacing Threshold Pulse Width: 0.4 ms
Lead Channel Sensing Intrinsic Amplitude: 2.5 mV
Lead Channel Sensing Intrinsic Amplitude: 2.5 mV
Lead Channel Sensing Intrinsic Amplitude: 5.875 mV
Lead Channel Sensing Intrinsic Amplitude: 5.875 mV
Lead Channel Setting Pacing Amplitude: 2 V
Lead Channel Setting Pacing Amplitude: 2.5 V
Lead Channel Setting Pacing Pulse Width: 0.4 ms
Lead Channel Setting Sensing Sensitivity: 0.9 mV

## 2020-04-29 NOTE — Progress Notes (Signed)
Remote pacemaker transmission.   

## 2020-05-05 ENCOUNTER — Encounter: Payer: Self-pay | Admitting: Cardiology

## 2020-05-05 ENCOUNTER — Other Ambulatory Visit: Payer: Self-pay

## 2020-05-05 ENCOUNTER — Ambulatory Visit (INDEPENDENT_AMBULATORY_CARE_PROVIDER_SITE_OTHER): Payer: Medicare Other | Admitting: Cardiology

## 2020-05-05 VITALS — BP 144/82 | HR 70 | Ht 71.0 in | Wt 237.0 lb

## 2020-05-05 DIAGNOSIS — I495 Sick sinus syndrome: Secondary | ICD-10-CM | POA: Diagnosis not present

## 2020-05-05 DIAGNOSIS — I471 Supraventricular tachycardia: Secondary | ICD-10-CM | POA: Diagnosis not present

## 2020-05-05 DIAGNOSIS — Z79899 Other long term (current) drug therapy: Secondary | ICD-10-CM | POA: Diagnosis not present

## 2020-05-05 DIAGNOSIS — Z950 Presence of cardiac pacemaker: Secondary | ICD-10-CM

## 2020-05-05 LAB — PACEMAKER DEVICE OBSERVATION

## 2020-05-05 MED ORDER — NITROGLYCERIN 0.4 MG SL SUBL: 0.4000 mg | SUBLINGUAL_TABLET | SUBLINGUAL | 11 refills | Status: AC | PRN

## 2020-05-05 NOTE — Patient Instructions (Addendum)
Medication Instructions:  Your physician recommends that you continue on your current medications as directed. Please refer to the Current Medication list given to you today.  Labwork: None ordered.  Testing/Procedures: You will get lab work today:  CMP, TSH and free T4  Follow-Up: Your physician wants you to follow-up in: one year with Dr. Quentin Ore.   You will receive a reminder letter in the mail two months in advance. If you don't receive a letter, please call our office to schedule the follow-up appointment.  Remote monitoring is used to monitor your Pacemaker from home. This monitoring reduces the number of office visits required to check your device to one time per year. It allows [place] to keep an eye on the functioning of your device to ensure it is working properly. You are scheduled for a device check from home on 07/22/2020. You may send your transmission at any time that day. If you have a wireless device, the transmission will be sent automatically. After your physician reviews your transmission, you will receive a postcard with your next transmission date.  Any Other Special Instructions Will Be Listed Below (If Applicable).  If you need a refill on your cardiac medications before your next appointment, please call your pharmacy.

## 2020-05-05 NOTE — Progress Notes (Signed)
Electrophysiology Office Follow up Visit Note:    Date:  05/05/2020   ID:  [name], DOB 02-16-1941, MRN [id number]  PCP:  Jared Seashore, MD  [facility] Cardiologist:  Jared Klein, MD  [facility] Electrophysiologist:  Jared Epley, MD    Interval History:    [name] is a 80 y.o. male who presents for a follow up visit. He underwent an EP study on 02/14/2020 for episodes of PPM detected SVT. EP study showed poor antegrade fast pathway conduction and clear evidence of dual antegrade AVN physiology. The decision was made at that EP study to not ablate the slow pathway given the high likelihood that he would develop complete heart block and complete dependence on his PPM. He was started on amiodarone after the EP study which has been well tolerated. PPM interrogation since the amiodarone was started show a decrease in the burden of SVT episodes.  Today he tells me that he is doing well. No palpitations or limitations in his activities.    Past Medical History:  Diagnosis Date  . Arthritis    "probably; hands" (04/12/2017)  . High cholesterol   . History of gout   . Hypertension   . OSA (obstructive sleep apnea)    per pt noncompliant CPAP has not use it since 2015  . Presence of permanent cardiac pacemaker 04/12/2017   Dual Chamber  . Wears dentures    upper    Past Surgical History:  Procedure Laterality Date  . CHOLECYSTECTOMY N/A 08/19/2017   Procedure: LAPAROSCOPIC ASSISTED OPEN CHOLECYSTECTOMY WITH INTRAOPERATIVE CHOLANGIOGRAM;  Surgeon: Jared Gemma, MD;  Location: [facility];  Service: General;  Laterality: N/A;  . ELBOW BURSA SURGERY Left 07-21-2009   extensive bursectomy/ tenosynovectomy/ ulnar nerve release  . EUS N/A 08/18/2017   Procedure: ESOPHAGEAL ENDOSCOPIC ULTRASOUND (EUS) RADIAL;  Surgeon: Jared Banister, MD;  [facility];  Service: Endoscopy;  Laterality: N/A;  . HAMMER TOE SURGERY Right 2015  . HAMMER TOE SURGERY  Right 12/11/2014   Procedure: 4th HAMMER TOE CORRECTION, EXCISION LESION 3rd TOE RIGHT FOOT;  Surgeon: [name], DPM;  Location: [place];  Service: Podiatry;  Laterality: Right;  . INSERT / REPLACE / REMOVE PACEMAKER  04/12/2017   Dual Chamber  . MICROLARYNGOSCOPY  06-07-2006   w/  Excision bilateral vocal cord lesions (bilateral benign nodules)  . PACEMAKER IMPLANT N/A 04/12/2017   Procedure: PACEMAKER IMPLANT - Dual Chamber;  Surgeon: Jared Klein, MD;  Location: [place] [facility];  Service: Cardiovascular;  Laterality: N/A;  . SVT ABLATION N/A 02/14/2020   Procedure: SVT ABLATION;  Surgeon: Jared Epley, MD;  Location: [place] [facility];  Service: Cardiovascular;  Laterality: N/A;    Current Medications: Current Meds  Medication Sig  . amiodarone (PACERONE) 200 MG tablet Take 1 tablet (200 mg total) by mouth 2 (two) times daily for 14 days, THEN 1 tablet (200 mg total) daily. (Patient taking differently: 1 tablet (200 mg total) daily.)  . aspirin EC 81 MG tablet Take 81 mg by mouth daily.  Marland Kitchen atorvastatin (LIPITOR) 20 MG tablet Take 20 mg by mouth daily.  . fluticasone (FLONASE) 50 MCG/ACT nasal spray Place 1 spray into both nostrils daily as needed for rhinitis.   Marland Kitchen losartan (COZAAR) 50 MG tablet Take 1 tablet (50 mg total) by mouth daily.  . Multiple Vitamin (MULTIVITAMIN WITH MINERALS) TABS tablet Take 1 tablet by mouth daily.  . potassium chloride SA (KLOR-CON) 20  MEQ tablet Take 20 mEq by mouth daily.  . tamsulosin (FLOMAX) 0.4 MG CAPS capsule Take 0.4 mg by mouth daily.  Marland Kitchen triamcinolone cream (KENALOG) 0.5 % Apply 1 application topically in the morning and at bedtime.  . [DISCONTINUED] nitroGLYCERIN (NITROSTAT) 0.4 MG SL tablet Place 1 tablet (0.4 mg total) under the tongue every 5 (five) minutes as needed for chest pain.     Allergies:   Patient has no known allergies.   Social History   Socioeconomic History  . Marital status: Married     Spouse name: Not on file  . Number of children: Not on file  . Years of education: Not on file  . Highest education level: Not on file  Occupational History  . Not on file  Tobacco Use  . Smoking status: Never Smoker  . Smokeless tobacco: Never Used  Vaping Use  . Vaping Use: Never used  Substance and Sexual Activity  . Alcohol use: Yes    Alcohol/week: 2.0 standard drinks    Types: 2 Standard drinks or equivalent per week  . Drug use: No  . Sexual activity: Yes  Other Topics Concern  . Not on file  Social History Narrative  . Not on file   Social Determinants of Health   Financial Resource Strain: Not on file  Food Insecurity: Not on file  Transportation Needs: Not on file  Physical Activity: Not on file  Stress: Not on file  Social Connections: Not on file     Family History: The patient's family history includes Heart attack in his sister; Heart attack (age of onset: 61) in his father; Osteoarthritis in his father; Stroke (age of onset: 64) in his mother. There is no history of Colon cancer.  ROS:   Please see the history of present illness.    All other systems reviewed and are negative.  EKGs/Labs/Other Studies Reviewed:    The following studies were reviewed today:  Intrinsic conduction shows PR of nearly 443m  05/05/2020 Device interrogation personally reviewed Lead parameters stable Battery longevity good Review of EGMS of SVT demonstrate simultaneous A and V suggestive of previously documented AVNRT  04/22/2020 remote interrogation showed 7 episodes of VHR, longest lasting 4:29  03/18/2020 remote interrogation showed 55 episodes of VHR, longest lasting 7:23  01/22/2020 remote interrogation showed 177 episodes of VHR, longest 5:51   EKG:  The ekg ordered today demonstrates RV pacing  Recent Labs: 03/18/2020: TSH 1.270 04/04/2020: ALT 24; BUN 19; Creatinine, Ser 1.35; Hemoglobin 14.3; Platelets 155; Potassium 3.9; Sodium 136  Recent Lipid Panel No  results found for: CHOL, TRIG, HDL, CHOLHDL, VLDL, LDLCALC, LDLDIRECT  Physical Exam:    VS:  BP (!) 144/82   Pulse 70   Ht _0  (1.803 m)   Wt 237 lb (107.5 kg)   SpO2 97%   BMI 33.05 kg/m     Wt Readings from Last 3 Encounters:  05/05/20 237 lb (107.5 kg)  04/04/20 235 lb (106.6 kg)  03/18/20 234 lb 3.2 oz (106.2 kg)     GEN:  Well nourished, well developed in no acute distress. Obese HEENT: Normal NECK: No JVD; No carotid bruits LYMPHATICS: No lymphadenopathy CARDIAC: RRR, no murmurs, rubs, gallops RESPIRATORY:  Clear to auscultation without rales, wheezing or rhonchi  ABDOMEN: Soft, non-tender, non-distended MUSCULOSKELETAL:  No edema; No deformity  SKIN: Warm and dry NEUROLOGIC:  Alert and oriented x 3 PSYCHIATRIC:  Normal affect   ASSESSMENT:    1. SSS (sick sinus syndrome) (  [place])   2. Pacemaker   3. SVT (supraventricular tachycardia) ([place])   4. On amiodarone therapy    PLAN:    In order of problems listed above:  1. AVNRT Patient with asymptomatic slow episodes of AVNRT detected on his PPM. Patient with prior EP study demonstrating very poor antegrade fast pathway conduction. Retrograde conduction was more robust. AVNRT was readily observed during the EP study. During the EP study, I elected not to ablate the slow pathway in an effort to avoid complete heart block. Amiodarone was started which has dramatically reduced the burden of AVNRT. For now, plan to check labs to confirm safe to continue amiodarone. Will plan to continue amiodarone if possible to control the AVNRT episdoes. If they become more frequent or symptomatic, can consider repeat EP study with ablation.  2. PPM Pacemaker interrogation shows stable lead parameters and good battery longevity.   Medication Adjustments/Labs and Tests Ordered: Current medicines are reviewed at length with the patient today.  Concerns regarding medicines are outlined above.  Orders Placed This Encounter  Procedures   . Comp Met (CMET)  . TSH  . T4, free  . EKG 12-Lead   Meds ordered this encounter  Medications  . nitroGLYCERIN (NITROSTAT) 0.4 MG SL tablet    Sig: Place 1 tablet (0.4 mg total) under the tongue every 5 (five) minutes as needed for chest pain.    Dispense:  25 tablet    Refill:  11     Signed, Lars Mage, MD, [facility]  05/05/2020 8:44 PM    Electrophysiology [facility]

## 2020-05-06 LAB — COMPREHENSIVE METABOLIC PANEL
ALT: 26 IU/L (ref 0–44)
AST: 24 IU/L (ref 0–40)
Albumin/Globulin Ratio: 1.7 (ref 1.2–2.2)
Albumin: 4.3 g/dL (ref 3.7–4.7)
Alkaline Phosphatase: 64 IU/L (ref 44–121)
BUN/Creatinine Ratio: 16 (ref 10–24)
BUN: 19 mg/dL (ref 8–27)
Bilirubin Total: 0.3 mg/dL (ref 0.0–1.2)
CO2: 17 mmol/L — ABNORMAL LOW (ref 20–29)
Calcium: 9.3 mg/dL (ref 8.6–10.2)
Chloride: 104 mmol/L (ref 96–106)
Creatinine, Ser: 1.16 mg/dL (ref 0.76–1.27)
Globulin, Total: 2.5 g/dL (ref 1.5–4.5)
Glucose: 98 mg/dL (ref 65–99)
Potassium: 4.8 mmol/L (ref 3.5–5.2)
Sodium: 140 mmol/L (ref 134–144)
Total Protein: 6.8 g/dL (ref 6.0–8.5)
eGFR: 64 mL/min/{1.73_m2} (ref >59)

## 2020-05-06 LAB — T4, FREE: Free T4: 0.93 ng/dL (ref 0.82–1.77)

## 2020-05-06 LAB — TSH: TSH: 1.91 u[IU]/mL (ref 0.450–4.500)

## 2020-05-13 DIAGNOSIS — M205X2 Other deformities of toe(s) (acquired), left foot: Secondary | ICD-10-CM | POA: Diagnosis not present

## 2020-05-13 DIAGNOSIS — M205X1 Other deformities of toe(s) (acquired), right foot: Secondary | ICD-10-CM | POA: Diagnosis not present

## 2020-05-13 DIAGNOSIS — D2371 Other benign neoplasm of skin of right lower limb, including hip: Secondary | ICD-10-CM | POA: Diagnosis not present

## 2020-05-13 DIAGNOSIS — L239 Allergic contact dermatitis, unspecified cause: Secondary | ICD-10-CM | POA: Diagnosis not present

## 2020-05-20 DIAGNOSIS — H2513 Age-related nuclear cataract, bilateral: Secondary | ICD-10-CM | POA: Diagnosis not present

## 2020-05-20 DIAGNOSIS — H02831 Dermatochalasis of right upper eyelid: Secondary | ICD-10-CM | POA: Diagnosis not present

## 2020-05-20 DIAGNOSIS — H16223 Keratoconjunctivitis sicca, not specified as Sjogren's, bilateral: Secondary | ICD-10-CM | POA: Diagnosis not present

## 2020-05-20 DIAGNOSIS — H02423 Myogenic ptosis of bilateral eyelids: Secondary | ICD-10-CM | POA: Diagnosis not present

## 2020-05-20 DIAGNOSIS — H02835 Dermatochalasis of left lower eyelid: Secondary | ICD-10-CM | POA: Diagnosis not present

## 2020-05-20 DIAGNOSIS — H02834 Dermatochalasis of left upper eyelid: Secondary | ICD-10-CM | POA: Diagnosis not present

## 2020-05-20 DIAGNOSIS — H01021 Squamous blepharitis right upper eyelid: Secondary | ICD-10-CM | POA: Diagnosis not present

## 2020-05-20 DIAGNOSIS — H01024 Squamous blepharitis left upper eyelid: Secondary | ICD-10-CM | POA: Diagnosis not present

## 2020-05-20 DIAGNOSIS — H5703 Miosis: Secondary | ICD-10-CM | POA: Diagnosis not present

## 2020-05-20 DIAGNOSIS — H02832 Dermatochalasis of right lower eyelid: Secondary | ICD-10-CM | POA: Diagnosis not present

## 2020-05-20 DIAGNOSIS — H40053 Ocular hypertension, bilateral: Secondary | ICD-10-CM | POA: Diagnosis not present

## 2020-05-20 DIAGNOSIS — H40033 Anatomical narrow angle, bilateral: Secondary | ICD-10-CM | POA: Diagnosis not present

## 2020-05-21 DIAGNOSIS — N2882 Megaloureter: Secondary | ICD-10-CM | POA: Diagnosis not present

## 2020-05-21 DIAGNOSIS — R3121 Asymptomatic microscopic hematuria: Secondary | ICD-10-CM | POA: Diagnosis not present

## 2020-05-21 DIAGNOSIS — N202 Calculus of kidney with calculus of ureter: Secondary | ICD-10-CM | POA: Diagnosis not present

## 2020-05-21 DIAGNOSIS — Z9049 Acquired absence of other specified parts of digestive tract: Secondary | ICD-10-CM | POA: Diagnosis not present

## 2020-05-25 DIAGNOSIS — N390 Urinary tract infection, site not specified: Secondary | ICD-10-CM | POA: Diagnosis not present

## 2020-05-25 DIAGNOSIS — R319 Hematuria, unspecified: Secondary | ICD-10-CM | POA: Diagnosis not present

## 2020-05-25 DIAGNOSIS — Z0000 Encounter for general adult medical examination without abnormal findings: Secondary | ICD-10-CM | POA: Diagnosis not present

## 2020-05-25 DIAGNOSIS — E782 Mixed hyperlipidemia: Secondary | ICD-10-CM | POA: Diagnosis not present

## 2020-05-25 DIAGNOSIS — M1000 Idiopathic gout, unspecified site: Secondary | ICD-10-CM | POA: Diagnosis not present

## 2020-05-25 DIAGNOSIS — I10 Essential (primary) hypertension: Secondary | ICD-10-CM | POA: Diagnosis not present

## 2020-06-01 DIAGNOSIS — I495 Sick sinus syndrome: Secondary | ICD-10-CM | POA: Diagnosis not present

## 2020-06-01 DIAGNOSIS — E782 Mixed hyperlipidemia: Secondary | ICD-10-CM | POA: Diagnosis not present

## 2020-06-01 DIAGNOSIS — I739 Peripheral vascular disease, unspecified: Secondary | ICD-10-CM | POA: Diagnosis not present

## 2020-06-01 DIAGNOSIS — I10 Essential (primary) hypertension: Secondary | ICD-10-CM | POA: Diagnosis not present

## 2020-06-01 DIAGNOSIS — M1000 Idiopathic gout, unspecified site: Secondary | ICD-10-CM | POA: Diagnosis not present

## 2020-06-01 DIAGNOSIS — I700 Atherosclerosis of aorta: Secondary | ICD-10-CM | POA: Diagnosis not present

## 2020-06-01 DIAGNOSIS — I872 Venous insufficiency (chronic) (peripheral): Secondary | ICD-10-CM | POA: Diagnosis not present

## 2020-06-01 DIAGNOSIS — I471 Supraventricular tachycardia: Secondary | ICD-10-CM | POA: Diagnosis not present

## 2020-06-01 DIAGNOSIS — R7303 Prediabetes: Secondary | ICD-10-CM | POA: Diagnosis not present

## 2020-06-01 DIAGNOSIS — Z0000 Encounter for general adult medical examination without abnormal findings: Secondary | ICD-10-CM | POA: Diagnosis not present

## 2020-06-10 ENCOUNTER — Other Ambulatory Visit: Payer: Self-pay | Admitting: Cardiology

## 2020-06-10 DIAGNOSIS — I471 Supraventricular tachycardia: Secondary | ICD-10-CM

## 2020-06-10 DIAGNOSIS — I495 Sick sinus syndrome: Secondary | ICD-10-CM

## 2020-06-15 DIAGNOSIS — L301 Dyshidrosis [pompholyx]: Secondary | ICD-10-CM | POA: Diagnosis not present

## 2020-06-15 DIAGNOSIS — L84 Corns and callosities: Secondary | ICD-10-CM | POA: Diagnosis not present

## 2020-06-26 DIAGNOSIS — R3121 Asymptomatic microscopic hematuria: Secondary | ICD-10-CM | POA: Diagnosis not present

## 2020-06-26 DIAGNOSIS — N202 Calculus of kidney with calculus of ureter: Secondary | ICD-10-CM | POA: Diagnosis not present

## 2020-06-29 DIAGNOSIS — E1351 Other specified diabetes mellitus with diabetic peripheral angiopathy without gangrene: Secondary | ICD-10-CM | POA: Diagnosis not present

## 2020-06-29 DIAGNOSIS — L84 Corns and callosities: Secondary | ICD-10-CM | POA: Diagnosis not present

## 2020-06-29 DIAGNOSIS — L602 Onychogryphosis: Secondary | ICD-10-CM | POA: Diagnosis not present

## 2020-07-02 DIAGNOSIS — H25812 Combined forms of age-related cataract, left eye: Secondary | ICD-10-CM | POA: Diagnosis not present

## 2020-07-02 DIAGNOSIS — H268 Other specified cataract: Secondary | ICD-10-CM | POA: Diagnosis not present

## 2020-07-02 DIAGNOSIS — H5703 Miosis: Secondary | ICD-10-CM | POA: Diagnosis not present

## 2020-07-22 ENCOUNTER — Ambulatory Visit (INDEPENDENT_AMBULATORY_CARE_PROVIDER_SITE_OTHER): Payer: Medicare Other

## 2020-07-22 DIAGNOSIS — I495 Sick sinus syndrome: Secondary | ICD-10-CM

## 2020-07-23 LAB — CUP PACEART REMOTE DEVICE CHECK
Battery Remaining Longevity: 115 mo
Battery Voltage: 3 V
Brady Statistic AP VP Percent: 55.35 %
Brady Statistic AP VS Percent: 0 %
Brady Statistic AS VP Percent: 44.51 %
Brady Statistic AS VS Percent: 0.14 %
Brady Statistic RA Percent Paced: 55.38 %
Brady Statistic RV Percent Paced: 99.86 %
Date Time Interrogation Session: 20220518021449
Implantable Lead Implant Date: 20190206
Implantable Lead Implant Date: 20190206
Implantable Lead Location: 753859
Implantable Lead Location: 753860
Implantable Lead Model: 5076
Implantable Lead Model: 5076
Implantable Pulse Generator Implant Date: 20190206
Lead Channel Impedance Value: 323 Ohm
Lead Channel Impedance Value: 361 Ohm
Lead Channel Impedance Value: 418 Ohm
Lead Channel Impedance Value: 456 Ohm
Lead Channel Pacing Threshold Amplitude: 0.5 V
Lead Channel Pacing Threshold Amplitude: 0.625 V
Lead Channel Pacing Threshold Pulse Width: 0.4 ms
Lead Channel Pacing Threshold Pulse Width: 0.4 ms
Lead Channel Sensing Intrinsic Amplitude: 1.75 mV
Lead Channel Sensing Intrinsic Amplitude: 1.75 mV
Lead Channel Sensing Intrinsic Amplitude: 7 mV
Lead Channel Sensing Intrinsic Amplitude: 7 mV
Lead Channel Setting Pacing Amplitude: 2 V
Lead Channel Setting Pacing Amplitude: 2.5 V
Lead Channel Setting Pacing Pulse Width: 0.4 ms
Lead Channel Setting Sensing Sensitivity: 0.9 mV

## 2020-07-29 DIAGNOSIS — Z1152 Encounter for screening for COVID-19: Secondary | ICD-10-CM | POA: Diagnosis not present

## 2020-08-11 DIAGNOSIS — Z1152 Encounter for screening for COVID-19: Secondary | ICD-10-CM | POA: Diagnosis not present

## 2020-08-13 NOTE — Progress Notes (Signed)
Remote pacemaker transmission.   

## 2020-08-17 DIAGNOSIS — Z1152 Encounter for screening for COVID-19: Secondary | ICD-10-CM | POA: Diagnosis not present

## 2020-08-20 DIAGNOSIS — Z1152 Encounter for screening for COVID-19: Secondary | ICD-10-CM | POA: Diagnosis not present

## 2020-08-25 DIAGNOSIS — Z1152 Encounter for screening for COVID-19: Secondary | ICD-10-CM | POA: Diagnosis not present

## 2020-08-29 DIAGNOSIS — Z1152 Encounter for screening for COVID-19: Secondary | ICD-10-CM | POA: Diagnosis not present

## 2020-08-31 DIAGNOSIS — Z1152 Encounter for screening for COVID-19: Secondary | ICD-10-CM | POA: Diagnosis not present

## 2020-09-04 DIAGNOSIS — Z1152 Encounter for screening for COVID-19: Secondary | ICD-10-CM | POA: Diagnosis not present

## 2020-09-09 DIAGNOSIS — Z1152 Encounter for screening for COVID-19: Secondary | ICD-10-CM | POA: Diagnosis not present

## 2020-09-10 DIAGNOSIS — H2511 Age-related nuclear cataract, right eye: Secondary | ICD-10-CM | POA: Diagnosis not present

## 2020-09-17 DIAGNOSIS — Z1152 Encounter for screening for COVID-19: Secondary | ICD-10-CM | POA: Diagnosis not present

## 2020-09-21 DIAGNOSIS — E1351 Other specified diabetes mellitus with diabetic peripheral angiopathy without gangrene: Secondary | ICD-10-CM | POA: Diagnosis not present

## 2020-09-21 DIAGNOSIS — L602 Onychogryphosis: Secondary | ICD-10-CM | POA: Diagnosis not present

## 2020-09-21 DIAGNOSIS — L84 Corns and callosities: Secondary | ICD-10-CM | POA: Diagnosis not present

## 2020-09-25 DIAGNOSIS — Z1152 Encounter for screening for COVID-19: Secondary | ICD-10-CM | POA: Diagnosis not present

## 2020-09-26 DIAGNOSIS — Z1152 Encounter for screening for COVID-19: Secondary | ICD-10-CM | POA: Diagnosis not present

## 2020-09-29 DIAGNOSIS — Z1152 Encounter for screening for COVID-19: Secondary | ICD-10-CM | POA: Diagnosis not present

## 2020-10-08 DIAGNOSIS — H9191 Unspecified hearing loss, right ear: Secondary | ICD-10-CM | POA: Diagnosis not present

## 2020-10-12 DIAGNOSIS — Z1152 Encounter for screening for COVID-19: Secondary | ICD-10-CM | POA: Diagnosis not present

## 2020-10-16 DIAGNOSIS — Z1152 Encounter for screening for COVID-19: Secondary | ICD-10-CM | POA: Diagnosis not present

## 2020-10-20 DIAGNOSIS — Z1152 Encounter for screening for COVID-19: Secondary | ICD-10-CM | POA: Diagnosis not present

## 2020-10-21 ENCOUNTER — Ambulatory Visit (INDEPENDENT_AMBULATORY_CARE_PROVIDER_SITE_OTHER): Payer: Medicare Other

## 2020-10-21 DIAGNOSIS — I495 Sick sinus syndrome: Secondary | ICD-10-CM

## 2020-10-21 LAB — CUP PACEART REMOTE DEVICE CHECK
Battery Remaining Longevity: 112 mo
Battery Voltage: 3 V
Brady Statistic AP VP Percent: 55.92 %
Brady Statistic AP VS Percent: 0 %
Brady Statistic AS VP Percent: 44 %
Brady Statistic AS VS Percent: 0.08 %
Brady Statistic RA Percent Paced: 55.92 %
Brady Statistic RV Percent Paced: 99.91 %
Date Time Interrogation Session: 20220817021707
Implantable Lead Implant Date: 20190206
Implantable Lead Implant Date: 20190206
Implantable Lead Location: 753859
Implantable Lead Location: 753860
Implantable Lead Model: 5076
Implantable Lead Model: 5076
Implantable Pulse Generator Implant Date: 20190206
Lead Channel Impedance Value: 323 Ohm
Lead Channel Impedance Value: 380 Ohm
Lead Channel Impedance Value: 456 Ohm
Lead Channel Impedance Value: 494 Ohm
Lead Channel Pacing Threshold Amplitude: 0.625 V
Lead Channel Pacing Threshold Amplitude: 0.625 V
Lead Channel Pacing Threshold Pulse Width: 0.4 ms
Lead Channel Pacing Threshold Pulse Width: 0.4 ms
Lead Channel Sensing Intrinsic Amplitude: 1.875 mV
Lead Channel Sensing Intrinsic Amplitude: 1.875 mV
Lead Channel Sensing Intrinsic Amplitude: 7.125 mV
Lead Channel Sensing Intrinsic Amplitude: 7.125 mV
Lead Channel Setting Pacing Amplitude: 2 V
Lead Channel Setting Pacing Amplitude: 2.5 V
Lead Channel Setting Pacing Pulse Width: 0.4 ms
Lead Channel Setting Sensing Sensitivity: 0.9 mV

## 2020-10-24 DIAGNOSIS — Z1152 Encounter for screening for COVID-19: Secondary | ICD-10-CM | POA: Diagnosis not present

## 2020-10-26 ENCOUNTER — Other Ambulatory Visit: Payer: Self-pay

## 2020-10-26 ENCOUNTER — Ambulatory Visit (INDEPENDENT_AMBULATORY_CARE_PROVIDER_SITE_OTHER): Payer: Medicare Other | Admitting: Cardiovascular Disease

## 2020-10-26 VITALS — BP 138/70 | HR 70 | Resp 16 | Ht 71.0 in | Wt 222.4 lb

## 2020-10-26 DIAGNOSIS — I471 Supraventricular tachycardia: Secondary | ICD-10-CM | POA: Diagnosis not present

## 2020-10-26 DIAGNOSIS — Z5181 Encounter for therapeutic drug level monitoring: Secondary | ICD-10-CM

## 2020-10-26 DIAGNOSIS — I441 Atrioventricular block, second degree: Secondary | ICD-10-CM | POA: Diagnosis not present

## 2020-10-26 DIAGNOSIS — G4733 Obstructive sleep apnea (adult) (pediatric): Secondary | ICD-10-CM

## 2020-10-26 DIAGNOSIS — I495 Sick sinus syndrome: Secondary | ICD-10-CM

## 2020-10-26 DIAGNOSIS — Z950 Presence of cardiac pacemaker: Secondary | ICD-10-CM | POA: Diagnosis not present

## 2020-10-26 DIAGNOSIS — I10 Essential (primary) hypertension: Secondary | ICD-10-CM

## 2020-10-26 DIAGNOSIS — E7800 Pure hypercholesterolemia, unspecified: Secondary | ICD-10-CM

## 2020-10-26 DIAGNOSIS — Z79899 Other long term (current) drug therapy: Secondary | ICD-10-CM

## 2020-10-26 NOTE — Patient Instructions (Signed)

## 2020-10-26 NOTE — Progress Notes (Signed)
Cardiology office note   Date:  10/27/2020   ID:  [name], [name], MRN [id number]   PCP:  Merrilee Seashore, MD  Cardiologist:  Sanda Klein, MD  Electrophysiologist:  Vickie Epley, MD   Evaluation Performed:  Follow-Up Visit  Chief Complaint: Pacemaker follow-up  History of Present Illness:    [name] is a 80 y.o. male with sick sinus syndrome and tachycardia-bradycardia syndrome, paroxysmal atrial tachycardia (possible AVNRT), second-degree atrioventricular block with bradycardia and near syncope for which he received a pacemaker in February 2019.  Additional problems include mild obesity, hyperlipidemia, essential hypertension, obstructive sleep apnea, gout.  Slow pathway radiofrequency ablation was attempted 02/14/2020 by Dr. Quentin Ore, but a bandanna due to the high risk of complete heart block.  He was started on amiodarone at that time.  The patient specifically denies any chest pain at rest exertion, dyspnea at rest or with exertion, orthopnea, paroxysmal nocturnal dyspnea, syncope, palpitations, focal neurological deficits, intermittent claudication, lower extremity edema, unexplained weight gain, cough, hemoptysis or wheezing.  He occasionally has mild swelling in the left ankle (site of a remote DVT), with history of a small organized clot in one of the popliteal veins.  Since initiation of amiodarone there is been an extremely low burden of arrhythmia.  Presenting rhythm today is AV sequential pacing.  Current device interrogation shows no SVT since his last appointment 05/05/2020.  There have been no episodes of high ventricular rate.  He has 56% atrial pacing and 99.9% ventricular pacing due to second-degree AV block (although he is not entirely pacemaker dependent).  Activity level is very high at 8.5 hours a day and consistent.  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  Lead and generator parameters are  normal, with expected pacemaker longevity of 9.1 years.  Amiodarone labs were checked and within normal range in March and he has labs scheduled in the next couple of weeks with Dr. Ashby Dawes again.  Both TSH and liver function tests were normal.  In March she also had a lipid profile that showed excellent LDL of 70 and HDL of 71 and well-controlled glucose levels with a hemoglobin A1c of 6.3%.    Past Medical History:  Diagnosis Date   Arthritis    "probably; hands" (04/12/2017)   High cholesterol    History of gout    Hypertension    OSA (obstructive sleep apnea)    per pt noncompliant CPAP has not use it since 2015   Presence of permanent cardiac pacemaker 04/12/2017   Dual Chamber   Wears dentures    upper   Past Surgical History:  Procedure Laterality Date   CHOLECYSTECTOMY N/A 08/19/2017   Procedure: LAPAROSCOPIC ASSISTED OPEN CHOLECYSTECTOMY WITH INTRAOPERATIVE CHOLANGIOGRAM;  Surgeon: Armandina Gemma, MD;  [facility];  Service: General;  Laterality: N/A;   ELBOW BURSA SURGERY Left 07-21-2009   extensive bursectomy/ tenosynovectomy/ ulnar nerve release   EUS N/A 08/18/2017   Procedure: ESOPHAGEAL ENDOSCOPIC ULTRASOUND (EUS) RADIAL;  Surgeon: Milus Banister, MD;  [facility];  Service: Endoscopy;  Laterality: N/A;   HAMMER TOE SURGERY Right 2015   HAMMER TOE SURGERY Right 12/11/2014   Procedure: 4th HAMMER TOE CORRECTION, EXCISION LESION 3rd TOE RIGHT FOOT;  Surgeon: Rosemary Holms, DPM;  Location: [place];  Service: Podiatry;  Laterality: Right;   INSERT / REPLACE / REMOVE PACEMAKER  04/12/2017   Dual Chamber   MICROLARYNGOSCOPY  06-07-2006  w/  Excision bilateral vocal cord lesions (bilateral benign nodules)   PACEMAKER IMPLANT N/A 04/12/2017   Procedure: PACEMAKER IMPLANT - Dual Chamber;  Surgeon: Sanda Klein, MD;  Location: [place] [facility];  Service: Cardiovascular;  Laterality: N/A;   SVT ABLATION N/A 02/14/2020   Procedure: SVT  ABLATION;  Surgeon: Vickie Epley, MD;  Location: [place] [facility];  Service: Cardiovascular;  Laterality: N/A;     Current Meds  Medication Sig   amiodarone (PACERONE) 200 MG tablet 1 tablet (200 mg total) daily.   aspirin EC 81 MG tablet Take 81 mg by mouth daily.   atorvastatin (LIPITOR) 20 MG tablet Take 20 mg by mouth daily.   fluticasone (FLONASE) 50 MCG/ACT nasal spray Place 1 spray into both nostrils daily as needed for rhinitis.    losartan (COZAAR) 50 MG tablet Take 1 tablet (50 mg total) by mouth daily.   Multiple Vitamin (MULTIVITAMIN WITH MINERALS) TABS tablet Take 1 tablet by mouth daily.   potassium chloride SA (KLOR-CON) 20 MEQ tablet Take 20 mEq by mouth daily.   tamsulosin (FLOMAX) 0.4 MG CAPS capsule Take 0.4 mg by mouth daily.   triamcinolone cream (KENALOG) 0.5 % Apply 1 application topically in the morning and at bedtime.     Allergies:   Patient has no known allergies.   Social History   Tobacco Use   Smoking status: Never   Smokeless tobacco: Never  Vaping Use   Vaping Use: Never used  Substance Use Topics   Alcohol use: Yes    Alcohol/week: 2.0 standard drinks    Types: 2 Standard drinks or equivalent per week   Drug use: No     Family Hx: The patient's family history includes Heart attack in his sister; Heart attack (age of onset: 66) in his father; Osteoarthritis in his father; Stroke (age of onset: 26) in his mother. There is no history of Colon cancer.  ROS:   Please see the history of present illness.   All other systems are reviewed and are negative.   Prior CV studies:   The following studies were reviewed today:  Comprehensive pacemaker check performed today  Labs/Other Tests and Data Reviewed:    EKG: Ordered today and personally reviewed shows AV sequential pacing.  Recent Labs: 04/04/2020: Hemoglobin 14.3; Platelets 155 05/05/2020: ALT 26; BUN 19; Creatinine, Ser 1.16; Potassium 4.8; Sodium 140; TSH 1.910  05/25/2020:  Hemoglobin A1c 6.3%, creatinine 1.3, ALT 27, TSH 1.8.  Recent Lipid Panel 04/26/2019 total cholesterol 126, HDL 52, LDL 74, triglycerides 43. 05/25/2020 Cholesterol 151, HDL 71, LDL 70, triglycerides 43   Wt Readings from Last 3 Encounters:  10/27/20 222 lb 6.4 oz (100.9 kg)  05/05/20 237 lb (107.5 kg)  04/04/20 235 lb (106.6 kg)     Objective:    Vital Signs:  BP 138/70   Pulse 70   Resp 16   Ht '5\' 11"'$  (1.803 m)   Wt 222 lb 6.4 oz (100.9 kg)   BMI 31.02 kg/m     General: Alert, oriented x3, no distress, mildly obese, but also appears stronger and fit for his age.  Healthy left subclavian pacemaker site. Head: no evidence of trauma, PERRL, EOMI, no exophtalmos or lid lag, no myxedema, no xanthelasma; normal ears, nose and oropharynx Neck: normal jugular venous pulsations and no hepatojugular reflux; brisk carotid pulses without delay and no carotid bruits Chest: clear to auscultation, no signs of consolidation by percussion or palpation, normal fremitus, symmetrical and full  respiratory excursions Cardiovascular: normal position and quality of the apical impulse, regular rhythm, normal first and second heart sounds, no murmurs, rubs or gallops Abdomen: no tenderness or distention, no masses by palpation, no abnormal pulsatility or arterial bruits, normal bowel sounds, no hepatosplenomegaly Extremities: no clubbing, cyanosis or edema; 2+ radial, ulnar and brachial pulses bilaterally; 2+ right femoral, posterior tibial and dorsalis pedis pulses; 2+ left femoral, posterior tibial and dorsalis pedis pulses; no subclavian or femoral bruits Neurological: grossly nonfocal Psych: Normal mood and affect    ASSESSMENT & PLAN:    1. AVNRT (AV nodal re-entry tachycardia) ([place])   2. Second degree atrioventricular block, Mobitz (type) I   3. Pacemaker   4. Encounter for monitoring amiodarone therapy   5. Essential hypertension   6. OSA (obstructive sleep apnea)   7.  Hypercholesterolemia     PAT: Although EP study demonstrated dual pathway AV node physiology, ablation could not be successfully performed.  He has been asymptomatic and arrhythmia free on amiodarone. 2nd deg AVB, MT1: He has not had syncope or near syncope since the pacemaker was implanted.  He has virtually 100% ventricular pacing due to second-degree AV block and now amiodarone therapy, although he is not truly pacemaker dependent. PM: Normal device function.  Continue remote downloads every 3 months. Amiodarone: So far well-tolerated without side effects, normal LFTs and TFTs in March, due for recheck next month with Dr. Ashby Dawes. HTN: Well-controlled. OSA: He reports that he uses CPAP most of the time, encouraged him to use it every night.  Also encouraged weight loss. HLP: All lipid parameters are excellent on labs checked few months ago.  Continue current dose of statin.  Patient Instructions  Medication Instructions:  No changes *If you need a refill on your cardiac medications before your next appointment, please call your pharmacy*   Lab Work: None ordered If you have labs (blood work) drawn today and your tests are completely normal, you will receive your results only by: [place] (if you have MyChart) OR A paper copy in the mail If you have any lab test that is abnormal or we need to change your treatment, we will call you to review the results.   Testing/Procedures: None ordered   Follow-Up: At [facility], you and your health needs are our priority.  As part of our continuing mission to provide you with exceptional heart care, we have created designated Provider Care Teams.  These Care Teams include your primary Cardiologist (physician) and Advanced Practice Providers (APPs -  Physician Assistants and Nurse Practitioners) who all work together to provide you with the care you need, when you need it.  We recommend signing up for the patient portal called  "MyChart".  Sign up information is provided on this After Visit Summary.  MyChart is used to connect with patients for Virtual Visits (Telemedicine).  Patients are able to view lab/test results, encounter notes, upcoming appointments, etc.  Non-urgent messages can be sent to your provider as well.   To learn more about what you can do with MyChart, go to [email or web address].    Your next appointment:   12 month(s)  The format for your next appointment:   In Person  Provider:   Sanda Klein, MD     Signed, Sanda Klein, MD  10/27/2020 10:48 AM    [facility]

## 2020-10-27 ENCOUNTER — Encounter: Payer: Self-pay | Admitting: Cardiovascular Disease

## 2020-10-27 DIAGNOSIS — E782 Mixed hyperlipidemia: Secondary | ICD-10-CM | POA: Diagnosis not present

## 2020-10-27 DIAGNOSIS — Z1152 Encounter for screening for COVID-19: Secondary | ICD-10-CM | POA: Diagnosis not present

## 2020-10-27 DIAGNOSIS — R7303 Prediabetes: Secondary | ICD-10-CM | POA: Diagnosis not present

## 2020-11-02 DIAGNOSIS — E782 Mixed hyperlipidemia: Secondary | ICD-10-CM | POA: Diagnosis not present

## 2020-11-02 DIAGNOSIS — I700 Atherosclerosis of aorta: Secondary | ICD-10-CM | POA: Diagnosis not present

## 2020-11-02 DIAGNOSIS — I739 Peripheral vascular disease, unspecified: Secondary | ICD-10-CM | POA: Diagnosis not present

## 2020-11-02 DIAGNOSIS — I10 Essential (primary) hypertension: Secondary | ICD-10-CM | POA: Diagnosis not present

## 2020-11-02 DIAGNOSIS — R7303 Prediabetes: Secondary | ICD-10-CM | POA: Diagnosis not present

## 2020-11-03 DIAGNOSIS — Z1152 Encounter for screening for COVID-19: Secondary | ICD-10-CM | POA: Diagnosis not present

## 2020-11-04 ENCOUNTER — Other Ambulatory Visit: Payer: Self-pay

## 2020-11-10 NOTE — Progress Notes (Signed)
Remote pacemaker transmission.   

## 2020-11-16 DIAGNOSIS — Z1152 Encounter for screening for COVID-19: Secondary | ICD-10-CM | POA: Diagnosis not present

## 2020-11-18 DIAGNOSIS — Z570 Occupational exposure to noise: Secondary | ICD-10-CM | POA: Diagnosis not present

## 2020-11-18 DIAGNOSIS — H6121 Impacted cerumen, right ear: Secondary | ICD-10-CM | POA: Diagnosis not present

## 2020-11-20 DIAGNOSIS — H57813 Brow ptosis, bilateral: Secondary | ICD-10-CM | POA: Diagnosis not present

## 2020-11-20 DIAGNOSIS — H04123 Dry eye syndrome of bilateral lacrimal glands: Secondary | ICD-10-CM | POA: Diagnosis not present

## 2020-11-20 DIAGNOSIS — H0279 Other degenerative disorders of eyelid and periocular area: Secondary | ICD-10-CM | POA: Diagnosis not present

## 2020-11-20 DIAGNOSIS — H53483 Generalized contraction of visual field, bilateral: Secondary | ICD-10-CM | POA: Diagnosis not present

## 2020-11-20 DIAGNOSIS — H02413 Mechanical ptosis of bilateral eyelids: Secondary | ICD-10-CM | POA: Diagnosis not present

## 2020-11-20 DIAGNOSIS — H0289 Other specified disorders of eyelid: Secondary | ICD-10-CM | POA: Diagnosis not present

## 2020-11-20 DIAGNOSIS — H02831 Dermatochalasis of right upper eyelid: Secondary | ICD-10-CM | POA: Diagnosis not present

## 2020-11-20 DIAGNOSIS — H02423 Myogenic ptosis of bilateral eyelids: Secondary | ICD-10-CM | POA: Diagnosis not present

## 2020-11-20 DIAGNOSIS — H02834 Dermatochalasis of left upper eyelid: Secondary | ICD-10-CM | POA: Diagnosis not present

## 2020-11-20 DIAGNOSIS — H019 Unspecified inflammation of eyelid: Secondary | ICD-10-CM | POA: Diagnosis not present

## 2020-11-22 DIAGNOSIS — Z1152 Encounter for screening for COVID-19: Secondary | ICD-10-CM | POA: Diagnosis not present

## 2020-11-24 DIAGNOSIS — Z1152 Encounter for screening for COVID-19: Secondary | ICD-10-CM | POA: Diagnosis not present

## 2020-12-01 DIAGNOSIS — H53483 Generalized contraction of visual field, bilateral: Secondary | ICD-10-CM | POA: Diagnosis not present

## 2020-12-08 DIAGNOSIS — Z1152 Encounter for screening for COVID-19: Secondary | ICD-10-CM | POA: Diagnosis not present

## 2020-12-12 DIAGNOSIS — Z1152 Encounter for screening for COVID-19: Secondary | ICD-10-CM | POA: Diagnosis not present

## 2020-12-14 DIAGNOSIS — H02834 Dermatochalasis of left upper eyelid: Secondary | ICD-10-CM | POA: Diagnosis not present

## 2020-12-14 DIAGNOSIS — H5703 Miosis: Secondary | ICD-10-CM | POA: Diagnosis not present

## 2020-12-14 DIAGNOSIS — H02835 Dermatochalasis of left lower eyelid: Secondary | ICD-10-CM | POA: Diagnosis not present

## 2020-12-14 DIAGNOSIS — H02832 Dermatochalasis of right lower eyelid: Secondary | ICD-10-CM | POA: Diagnosis not present

## 2020-12-14 DIAGNOSIS — H01021 Squamous blepharitis right upper eyelid: Secondary | ICD-10-CM | POA: Diagnosis not present

## 2020-12-14 DIAGNOSIS — H02831 Dermatochalasis of right upper eyelid: Secondary | ICD-10-CM | POA: Diagnosis not present

## 2020-12-14 DIAGNOSIS — H16223 Keratoconjunctivitis sicca, not specified as Sjogren's, bilateral: Secondary | ICD-10-CM | POA: Diagnosis not present

## 2020-12-14 DIAGNOSIS — H02423 Myogenic ptosis of bilateral eyelids: Secondary | ICD-10-CM | POA: Diagnosis not present

## 2020-12-14 DIAGNOSIS — H40053 Ocular hypertension, bilateral: Secondary | ICD-10-CM | POA: Diagnosis not present

## 2020-12-14 DIAGNOSIS — H01024 Squamous blepharitis left upper eyelid: Secondary | ICD-10-CM | POA: Diagnosis not present

## 2020-12-14 DIAGNOSIS — H40033 Anatomical narrow angle, bilateral: Secondary | ICD-10-CM | POA: Diagnosis not present

## 2020-12-14 DIAGNOSIS — Z961 Presence of intraocular lens: Secondary | ICD-10-CM | POA: Diagnosis not present

## 2020-12-30 DIAGNOSIS — L84 Corns and callosities: Secondary | ICD-10-CM | POA: Diagnosis not present

## 2020-12-30 DIAGNOSIS — B351 Tinea unguium: Secondary | ICD-10-CM | POA: Diagnosis not present

## 2020-12-30 DIAGNOSIS — L603 Nail dystrophy: Secondary | ICD-10-CM | POA: Diagnosis not present

## 2021-01-11 ENCOUNTER — Telehealth: Payer: Self-pay

## 2021-01-11 NOTE — Telephone Encounter (Signed)
   [facility] Pre-operative Risk Assessment    Patient Name: [name]  DOB: 14-Apr-1940 MRN: [id number]  Request for surgical clearance:  What type of surgery is being performed? BILAT UPPER EYELID BLEPHAROPTOSIS REPAIR AND BLEPHAROPLASTY  When is this surgery scheduled? 01-25-21  What type of clearance is required (medical clearance vs. Pharmacy clearance to hold med vs. Both)? BOTH  Are there any medications that need to be held prior to surgery and how long? ASA  Practice name and name of physician performing surgery? [facility]   What is the office phone number? [phone]   7.   What is the office fax number? [phone]  8.   Anesthesia type (None, local, MAC, general) ?

## 2021-01-13 NOTE — Telephone Encounter (Signed)
    Patient Name: [name]  DOB: Aug 12, 1940 MRN: [id number]  Primary Cardiologist: Sanda Klein, MD  Chart reviewed as part of pre-operative protocol coverage. Patient was last seen by Dr. Sallyanne Kuster in 10/2020. Patient was contacted today for further pre-op evaluation and reported doing well from a cardiac standpoint. He denied any chest pain, shortness of breath, acute CHF symptoms, palpitations, or syncope. Able to complete >4.0 METS without any problems. Given past medical history and time since last visit, based on ACC/AHA guidelines, [name] would be at acceptable risk for the planned procedure without further cardiovascular testing.   Patient takes Aspirin for primary prevention. He has never had a MI, PCI, or stroke. Therefore, OK to hold Aspirin if needed prior to procedure. Please restart this as soon as possible afterwards.   I will route this recommendation to the requesting party via Epic fax function and remove from pre-op pool.  Please call with questions.  Darreld Mclean, PA-C 01/13/2021, 3:56 PM

## 2021-01-13 NOTE — Telephone Encounter (Signed)
Left message to call back and ask to speak with pre-op team.  Darreld Mclean, PA-C 01/13/2021 1:34 PM

## 2021-01-17 DIAGNOSIS — Z1152 Encounter for screening for COVID-19: Secondary | ICD-10-CM | POA: Diagnosis not present

## 2021-01-18 NOTE — Telephone Encounter (Signed)
Re-faxed clearance notes to correct fax # [phone]

## 2021-01-18 NOTE — Telephone Encounter (Signed)
Will route to callback to assist and ensure Callie's clearance is sent correctly.

## 2021-01-18 NOTE — Telephone Encounter (Signed)
[facility] was calling to check the status of the surgical clearance. Please fax clearance to [phone].  Phone # and fax# were switched in the initial encounter

## 2021-01-19 NOTE — Telephone Encounter (Signed)
Office is calling back because they need to know how many days prior to do the patient needs to stop the aspirin. Please advise

## 2021-01-19 NOTE — Telephone Encounter (Signed)
Notes faxed to surgeon. This phone note will be removed from the preop pool. Richardson Dopp, PA-C  01/19/2021 12:41 PM

## 2021-01-19 NOTE — Telephone Encounter (Signed)
   Name: [name] DOB: 03-Aug-1940  MRN: [id number]  Primary Cardiologist: Sanda Klein, MD  Chart reviewed as part of pre-operative protocol coverage.    Recommendations: Aspirin can be held for 7 days prior to the procedure and resumed afterward when cleared by the surgeon.   Please call with questions. Richardson Dopp, PA-C 01/19/2021, 12:37 PM

## 2021-01-20 ENCOUNTER — Ambulatory Visit (INDEPENDENT_AMBULATORY_CARE_PROVIDER_SITE_OTHER): Payer: Medicare Other

## 2021-01-20 DIAGNOSIS — I495 Sick sinus syndrome: Secondary | ICD-10-CM

## 2021-01-20 DIAGNOSIS — Z23 Encounter for immunization: Secondary | ICD-10-CM | POA: Diagnosis not present

## 2021-01-20 LAB — CUP PACEART REMOTE DEVICE CHECK
Battery Remaining Longevity: 105 mo
Battery Voltage: 2.99 V
Brady Statistic AP VP Percent: 62.85 %
Brady Statistic AP VS Percent: 0 %
Brady Statistic AS VP Percent: 37.08 %
Brady Statistic AS VS Percent: 0.07 %
Brady Statistic RA Percent Paced: 62.86 %
Brady Statistic RV Percent Paced: 99.93 %
Date Time Interrogation Session: 20221116012456
Implantable Lead Implant Date: 20190206
Implantable Lead Implant Date: 20190206
Implantable Lead Location: 753859
Implantable Lead Location: 753860
Implantable Lead Model: 5076
Implantable Lead Model: 5076
Implantable Pulse Generator Implant Date: 20190206
Lead Channel Impedance Value: 342 Ohm
Lead Channel Impedance Value: 380 Ohm
Lead Channel Impedance Value: 418 Ohm
Lead Channel Impedance Value: 456 Ohm
Lead Channel Pacing Threshold Amplitude: 0.625 V
Lead Channel Pacing Threshold Amplitude: 0.625 V
Lead Channel Pacing Threshold Pulse Width: 0.4 ms
Lead Channel Pacing Threshold Pulse Width: 0.4 ms
Lead Channel Sensing Intrinsic Amplitude: 2 mV
Lead Channel Sensing Intrinsic Amplitude: 2 mV
Lead Channel Sensing Intrinsic Amplitude: 4.875 mV
Lead Channel Sensing Intrinsic Amplitude: 4.875 mV
Lead Channel Setting Pacing Amplitude: 2 V
Lead Channel Setting Pacing Amplitude: 2.5 V
Lead Channel Setting Pacing Pulse Width: 0.4 ms
Lead Channel Setting Sensing Sensitivity: 0.9 mV

## 2021-01-25 DIAGNOSIS — H53483 Generalized contraction of visual field, bilateral: Secondary | ICD-10-CM | POA: Diagnosis not present

## 2021-01-25 DIAGNOSIS — H02423 Myogenic ptosis of bilateral eyelids: Secondary | ICD-10-CM | POA: Diagnosis not present

## 2021-01-25 DIAGNOSIS — H57813 Brow ptosis, bilateral: Secondary | ICD-10-CM | POA: Diagnosis not present

## 2021-02-01 NOTE — Progress Notes (Signed)
Remote pacemaker transmission.   

## 2021-02-05 DIAGNOSIS — H02135 Senile ectropion of left lower eyelid: Secondary | ICD-10-CM | POA: Diagnosis not present

## 2021-02-05 DIAGNOSIS — H02132 Senile ectropion of right lower eyelid: Secondary | ICD-10-CM | POA: Diagnosis not present

## 2021-02-05 DIAGNOSIS — H0289 Other specified disorders of eyelid: Secondary | ICD-10-CM | POA: Diagnosis not present

## 2021-02-05 DIAGNOSIS — Z09 Encounter for follow-up examination after completed treatment for conditions other than malignant neoplasm: Secondary | ICD-10-CM | POA: Diagnosis not present

## 2021-02-05 DIAGNOSIS — H0015 Chalazion left lower eyelid: Secondary | ICD-10-CM | POA: Diagnosis not present

## 2021-02-09 DIAGNOSIS — H40033 Anatomical narrow angle, bilateral: Secondary | ICD-10-CM | POA: Diagnosis not present

## 2021-02-09 DIAGNOSIS — Z961 Presence of intraocular lens: Secondary | ICD-10-CM | POA: Diagnosis not present

## 2021-02-09 DIAGNOSIS — H02423 Myogenic ptosis of bilateral eyelids: Secondary | ICD-10-CM | POA: Diagnosis not present

## 2021-02-09 DIAGNOSIS — H40053 Ocular hypertension, bilateral: Secondary | ICD-10-CM | POA: Diagnosis not present

## 2021-02-09 DIAGNOSIS — H01021 Squamous blepharitis right upper eyelid: Secondary | ICD-10-CM | POA: Diagnosis not present

## 2021-02-09 DIAGNOSIS — H02831 Dermatochalasis of right upper eyelid: Secondary | ICD-10-CM | POA: Diagnosis not present

## 2021-02-09 DIAGNOSIS — H01024 Squamous blepharitis left upper eyelid: Secondary | ICD-10-CM | POA: Diagnosis not present

## 2021-02-09 DIAGNOSIS — H02835 Dermatochalasis of left lower eyelid: Secondary | ICD-10-CM | POA: Diagnosis not present

## 2021-02-09 DIAGNOSIS — H02832 Dermatochalasis of right lower eyelid: Secondary | ICD-10-CM | POA: Diagnosis not present

## 2021-02-09 DIAGNOSIS — H5703 Miosis: Secondary | ICD-10-CM | POA: Diagnosis not present

## 2021-02-09 DIAGNOSIS — H16223 Keratoconjunctivitis sicca, not specified as Sjogren's, bilateral: Secondary | ICD-10-CM | POA: Diagnosis not present

## 2021-02-09 DIAGNOSIS — H02834 Dermatochalasis of left upper eyelid: Secondary | ICD-10-CM | POA: Diagnosis not present

## 2021-02-18 DIAGNOSIS — Z87442 Personal history of urinary calculi: Secondary | ICD-10-CM | POA: Diagnosis not present

## 2021-02-20 DIAGNOSIS — Z1152 Encounter for screening for COVID-19: Secondary | ICD-10-CM | POA: Diagnosis not present

## 2021-03-02 DIAGNOSIS — Z1152 Encounter for screening for COVID-19: Secondary | ICD-10-CM | POA: Diagnosis not present

## 2021-03-05 DIAGNOSIS — Z1152 Encounter for screening for COVID-19: Secondary | ICD-10-CM | POA: Diagnosis not present

## 2021-03-16 DIAGNOSIS — Z1152 Encounter for screening for COVID-19: Secondary | ICD-10-CM | POA: Diagnosis not present

## 2021-03-25 ENCOUNTER — Encounter: Payer: Self-pay | Admitting: Gastroenterology

## 2021-03-31 DIAGNOSIS — L603 Nail dystrophy: Secondary | ICD-10-CM | POA: Diagnosis not present

## 2021-03-31 DIAGNOSIS — I70203 Unspecified atherosclerosis of native arteries of extremities, bilateral legs: Secondary | ICD-10-CM | POA: Diagnosis not present

## 2021-03-31 DIAGNOSIS — B351 Tinea unguium: Secondary | ICD-10-CM | POA: Diagnosis not present

## 2021-03-31 DIAGNOSIS — L309 Dermatitis, unspecified: Secondary | ICD-10-CM | POA: Diagnosis not present

## 2021-04-09 DIAGNOSIS — H40033 Anatomical narrow angle, bilateral: Secondary | ICD-10-CM | POA: Diagnosis not present

## 2021-04-09 DIAGNOSIS — H02423 Myogenic ptosis of bilateral eyelids: Secondary | ICD-10-CM | POA: Diagnosis not present

## 2021-04-09 DIAGNOSIS — H5703 Miosis: Secondary | ICD-10-CM | POA: Diagnosis not present

## 2021-04-09 DIAGNOSIS — H02832 Dermatochalasis of right lower eyelid: Secondary | ICD-10-CM | POA: Diagnosis not present

## 2021-04-09 DIAGNOSIS — H01021 Squamous blepharitis right upper eyelid: Secondary | ICD-10-CM | POA: Diagnosis not present

## 2021-04-09 DIAGNOSIS — H02835 Dermatochalasis of left lower eyelid: Secondary | ICD-10-CM | POA: Diagnosis not present

## 2021-04-09 DIAGNOSIS — H40053 Ocular hypertension, bilateral: Secondary | ICD-10-CM | POA: Diagnosis not present

## 2021-04-09 DIAGNOSIS — H02834 Dermatochalasis of left upper eyelid: Secondary | ICD-10-CM | POA: Diagnosis not present

## 2021-04-09 DIAGNOSIS — H01024 Squamous blepharitis left upper eyelid: Secondary | ICD-10-CM | POA: Diagnosis not present

## 2021-04-09 DIAGNOSIS — H16223 Keratoconjunctivitis sicca, not specified as Sjogren's, bilateral: Secondary | ICD-10-CM | POA: Diagnosis not present

## 2021-04-09 DIAGNOSIS — Z9889 Other specified postprocedural states: Secondary | ICD-10-CM | POA: Diagnosis not present

## 2021-04-09 DIAGNOSIS — H02831 Dermatochalasis of right upper eyelid: Secondary | ICD-10-CM | POA: Diagnosis not present

## 2021-04-19 ENCOUNTER — Ambulatory Visit (INDEPENDENT_AMBULATORY_CARE_PROVIDER_SITE_OTHER): Payer: Medicare Other

## 2021-04-19 ENCOUNTER — Encounter (HOSPITAL_COMMUNITY): Payer: Self-pay | Admitting: Emergency Medicine

## 2021-04-19 ENCOUNTER — Other Ambulatory Visit: Payer: Self-pay

## 2021-04-19 ENCOUNTER — Ambulatory Visit (HOSPITAL_COMMUNITY)
Admission: EM | Admit: 2021-04-19 | Discharge: 2021-04-19 | Disposition: A | Discharge status: Home / Self Care | Payer: Medicare Other | Attending: Sports Medicine | Admitting: Sports Medicine

## 2021-04-19 DIAGNOSIS — R600 Localized edema: Secondary | ICD-10-CM | POA: Diagnosis not present

## 2021-04-19 DIAGNOSIS — S52551A Other extraarticular fracture of lower end of right radius, initial encounter for closed fracture: Secondary | ICD-10-CM

## 2021-04-19 DIAGNOSIS — I10 Essential (primary) hypertension: Secondary | ICD-10-CM | POA: Diagnosis not present

## 2021-04-19 NOTE — Progress Notes (Signed)
Orthopedic Tech Progress Note Patient Details:  [name] 05/02/1940 [id number]  Ortho Devices Type of Ortho Device: Cotton web roll, Sugartong splint Ortho Device/Splint Location: RUE Ortho Device/Splint Interventions: Ordered, Application, Adjustment   Post Interventions Patient Tolerated: Well Instructions Provided: Care of device  [name] 04/19/2021, 10:46 AM

## 2021-04-19 NOTE — ED Provider Notes (Signed)
[place]    CSN: [id number] Arrival date & time: 04/19/21  4008      History   Chief Complaint Chief Complaint  Patient presents with   Wrist Injury    HPI [name] is a 81 y.o. male here for right wrist injury.   Wrist Injury  [name] is a pleasant 81 year old male who presents today for right wrist injury after a fall with an apparent [place] injury last night.  Patient states he lost his balance and fell to the side and tried to reach out to catch himself with the right hand and had some pain at that area.  He put himself into a soft wrist brace and went to sleep and presented here to the urgent care this morning.  He has not taken any medication for the pain.  Has not applied any ice nor heat.  He denies any previous injury or fracture to this wrist/hand.  He denies any breaks in the skin.  He does note some swelling on the dorsal aspect of the wrist.  His pain is localized on the dorsum of the wrist at the wrist/hand junction per the patient.  Denies hitting his head, no other injuries reported.  Past Medical History:  Diagnosis Date   Arthritis    "probably; hands" (04/12/2017)   High cholesterol    History of gout    Hypertension    OSA (obstructive sleep apnea)    per pt noncompliant CPAP has not use it since 2015   Presence of permanent cardiac pacemaker 04/12/2017   Dual Chamber   Wears dentures    upper    Patient Active Problem List   Diagnosis Date Noted   Hypercholesteremia 08/01/2018   Acute biliary pancreatitis 08/17/2017   Pacemaker 04/12/2017   Syncope and collapse 01/23/2017   Second degree atrioventricular block, Mobitz (type) I 01/23/2017   SSS (sick sinus syndrome) ([place]) 01/23/2017   SVT (supraventricular tachycardia) ([place]) 01/23/2017   Other fatigue 01/23/2017   Chest pain 12/28/2016   Essential hypertension 12/28/2016   BRBPR (bright red blood per rectum) 11/04/2010   Mild renal insufficiency 11/04/2010   Sleep apnea  11/04/2010    Past Surgical History:  Procedure Laterality Date   CHOLECYSTECTOMY N/A 08/19/2017   Procedure: LAPAROSCOPIC ASSISTED OPEN CHOLECYSTECTOMY WITH INTRAOPERATIVE CHOLANGIOGRAM;  Surgeon: Armandina Gemma, MD;  [facility];  Service: General;  Laterality: N/A;   ELBOW BURSA SURGERY Left 07-21-2009   extensive bursectomy/ tenosynovectomy/ ulnar nerve release   EUS N/A 08/18/2017   Procedure: ESOPHAGEAL ENDOSCOPIC ULTRASOUND (EUS) RADIAL;  Surgeon: Milus Banister, MD;  [facility];  Service: Endoscopy;  Laterality: N/A;   HAMMER TOE SURGERY Right 2015   HAMMER TOE SURGERY Right 12/11/2014   Procedure: 4th HAMMER TOE CORRECTION, EXCISION LESION 3rd TOE RIGHT FOOT;  Surgeon: Rosemary Holms, DPM;  Location: [facility];  Service: Podiatry;  Laterality: Right;   INSERT / REPLACE / REMOVE PACEMAKER  04/12/2017   Dual Chamber   MICROLARYNGOSCOPY  06-07-2006   w/  Excision bilateral vocal cord lesions (bilateral benign nodules)   PACEMAKER IMPLANT N/A 04/12/2017   Procedure: PACEMAKER IMPLANT - Dual Chamber;  Surgeon: Sanda Klein, MD;  Location: [facility];  Service: Cardiovascular;  Laterality: N/A;   SVT ABLATION N/A 02/14/2020   Procedure: SVT ABLATION;  Surgeon: Vickie Epley, MD;  Location: [facility];  Service: Cardiovascular;  Laterality: N/A;       Home  Medications    Prior to Admission medications   Medication Sig Start Date End Date Taking? Authorizing Provider  amiodarone (PACERONE) 200 MG tablet 1 tablet (200 mg total) daily. 06/10/20   Vickie Epley, MD  amiodarone (PACERONE) 200 MG tablet Take 200 mg by mouth daily.    [provider]  aspirin EC 81 MG tablet Take 81 mg by mouth daily.    [provider]  atorvastatin (LIPITOR) 20 MG tablet Take 20 mg by mouth daily.    [provider]  fluticasone (FLONASE) 50 MCG/ACT nasal spray Place 1 spray into both nostrils daily as needed for  rhinitis.  09/01/19   [provider]  losartan (COZAAR) 50 MG tablet Take 1 tablet (50 mg total) by mouth daily. 02/19/20   Kroeger, Lorelee Cover., PA-C  Multiple Vitamin (MULTIVITAMIN WITH MINERALS) TABS tablet Take 1 tablet by mouth daily.    [provider]  nitroGLYCERIN (NITROSTAT) 0.4 MG SL tablet Place 1 tablet (0.4 mg total) under the tongue every 5 (five) minutes as needed for chest pain. 05/05/20 08/03/20  Vickie Epley, MD  potassium chloride SA (KLOR-CON) 20 MEQ tablet Take 20 mEq by mouth daily. 12/14/18   [provider]  tamsulosin (FLOMAX) 0.4 MG CAPS capsule Take 0.4 mg by mouth daily. 04/03/19   [provider]  triamcinolone cream (KENALOG) 0.5 % Apply 1 application topically in the morning and at bedtime. 02/05/20   [provider]    Family History Family History  Problem Relation Age of Onset   Stroke Mother 3       had gangrene of toe, then later "stroke" per patient   Osteoarthritis Father    Heart attack Father 1   Heart attack Sister        sudden cardiac arrest   Colon cancer Neg Hx     Social History Social History   Tobacco Use   Smoking status: Never   Smokeless tobacco: Never  Vaping Use   Vaping Use: Never used  Substance Use Topics   Alcohol use: Yes    Alcohol/week: 2.0 standard drinks    Types: 2 Standard drinks or equivalent per week   Drug use: No     Allergies   Patient has no known allergies.   Review of Systems Review of Systems  Musculoskeletal:  Positive for arthralgias (right wrist) and joint swelling.  Skin:  Negative for color change, rash and wound.  Neurological:  Negative for dizziness and weakness.    Physical Exam Triage Vital Signs ED Triage Vitals  Enc Vitals Group     BP 04/19/21 0946 (!) 161/71     Pulse Rate 04/19/21 0946 73     Resp 04/19/21 0946 19     Temp 04/19/21 0946 98.6 F (37 C)     Temp Source 04/19/21 0946 Oral     SpO2 04/19/21 0946 96 %     Weight --       Height --      Head Circumference --      Peak Flow --      Pain Score 04/19/21 0945 10     Pain Loc --      Pain Edu? --      Excl. in [place]? --    No data found.  Updated Vital Signs BP (!) 161/71 (BP Location: Left Arm)    Pulse 73    Temp 98.6 F (37 C) (Oral)    Resp 19  SpO2 96%    Physical Exam Gen: Well-appearing, in no acute distress; non-toxic CV: Regular Rate. Well-perfused. Warm.  Resp: Breathing unlabored on room air; no wheezing. Psych: Fluid speech in conversation; appropriate affect; normal thought process Neuro: Sensation intact throughout. No gross coordination deficits.  MSK:  - Right wrist/hand: + TTP noted at the dorsal radius, radial-side > ulnar-side.  Inspection yields some soft tissue swelling on the dorsum of the wrist.  There are Bouchard's and her bodies nodules throughout the fingers.  He is able to move all 5 fingers without pain or difficulty.  No metacarpal TTP.  No tenderness over the ulnar styloid.  Range of motion is somewhat limited secondary to pain. Limited wrist extension secondary to pain.  Neurovascular intact distally with radial pulse 2/4.   UC Treatments / Results  Labs (all labs ordered are listed, but only abnormal results are displayed) Labs Reviewed - No data to display  EKG   Radiology DG Wrist Complete Right  Result Date: 04/19/2021 CLINICAL DATA:  Fall yesterday, pain EXAM: RIGHT WRIST - COMPLETE 3+ VIEW; RIGHT HAND - COMPLETE 3+ VIEW COMPARISON:  None. FINDINGS: The appearance of the thenar aspect of the distal right radius is suspicious for a nondisplaced fracture. No definite fracture or dislocation of the right hand or wrist. Moderate arthrosis about the hand and wrist with periarticular calcifications about the proximal interphalangeal joints and metacarpophalangeal joints, as well as chondrocalcinosis of the radiocarpal joint. Diffuse soft tissue edema about the wrist. IMPRESSION: 1. The appearance of the thenar aspect  of the distal right radius is suspicious for a nondisplaced fracture. 2. No definite or displaced fracture or dislocation of the right hand or wrist. 3. Moderate arthrosis about the hand and wrist with periarticular calcifications, as well as chondrocalcinosis of the radiocarpal joint, constellation of findings suggesting CPPD (pseudogout). 4.  Soft tissue edema. Electronically Signed   By: Delanna Ahmadi M.D.   On: 04/19/2021 10:15   DG Hand Complete Right  Result Date: 04/19/2021 CLINICAL DATA:  Fall yesterday, pain EXAM: RIGHT WRIST - COMPLETE 3+ VIEW; RIGHT HAND - COMPLETE 3+ VIEW COMPARISON:  None. FINDINGS: The appearance of the thenar aspect of the distal right radius is suspicious for a nondisplaced fracture. No definite fracture or dislocation of the right hand or wrist. Moderate arthrosis about the hand and wrist with periarticular calcifications about the proximal interphalangeal joints and metacarpophalangeal joints, as well as chondrocalcinosis of the radiocarpal joint. Diffuse soft tissue edema about the wrist. IMPRESSION: 1. The appearance of the thenar aspect of the distal right radius is suspicious for a nondisplaced fracture. 2. No definite or displaced fracture or dislocation of the right hand or wrist. 3. Moderate arthrosis about the hand and wrist with periarticular calcifications, as well as chondrocalcinosis of the radiocarpal joint, constellation of findings suggesting CPPD (pseudogout). 4.  Soft tissue edema. Electronically Signed   By: Delanna Ahmadi M.D.   On: 04/19/2021 10:15    Procedures Procedures (including critical care time)  Medications Ordered in UC Medications - No data to display  Initial Impression / Assessment and Plan / UC Course  I have reviewed the triage vital signs and the nursing notes.  Pertinent labs & imaging results that were available during my care of the patient were reviewed by me and considered in my medical decision making (see chart for  details).     Distal radius fracture, right - nondisplaced, extra-articular Elevated blood pressure with history of hypertension   Patient  with right wrist pain after [place] injury x1 day.  X-ray demonstrated suspicion for distal radius fracture of the thenar aspect of the right wrist.  He is tender in this location and upon my radiographic review it does appear he has a nondisplaced fracture of the distal radius, moreso of an avulsion fracture as I do not see a fracture line transecting through the entire radius. Patient was placed in a sugar-tong splint.  In terms of his blood pressure, it is likely elevated secondary to acute injury.  Did recommend he follow-up with his primary care physician for this management.  He is asymptomatic.  He may use ice, Tylenol for pain control.  He will follow-up with local orthopedic or sports medicine in the next 1-2 days for follow-up.  Return precautions were provided. Safe for discharge home.   Final Clinical Impressions(s) / UC Diagnoses   Final diagnoses:  Other closed extra-articular fracture of distal end of right radius, initial encounter  Elevated blood pressure reading with diagnosis of hypertension     Discharge Instructions      Follow-up with either sports medicine center or [place] [facility] orthopedic.  You will need to call them today to get an appointment sometime tomorrow or Wednesday.  You may ice and/or take Tylenol for any pain control and to help with the swelling.  You are to remain in the splint until you see sports medicine or orthopedics.     ED Prescriptions   None    PDMP not reviewed this encounter.   [name], DO 04/19/21 1154

## 2021-04-19 NOTE — ED Provider Notes (Incomplete)
[place]    CSN: [id number] Arrival date & time: 04/19/21  2831      History   Chief Complaint Chief Complaint  Patient presents with   Wrist Injury    HPI [name] is a 81 y.o. male here for right wrist injury.   Wrist Injury  [name] is a pleasant 81 year old male who presents today for right wrist injury after a fall with an apparent [place] injury last night.  Patient states he lost his balance and fell to the side and tried to reach out to catch himself with the right hand and had some pain at that area.  He put himself into a soft wrist brace and went to sleep and presented here to the urgent care this morning.  He has not taken any medication for the pain.  Has not applied any ice nor heat.  He denies any previous injury or fracture to this wrist/hand.  He denies any breaks in the skin.  He does note some swelling on the dorsal aspect of the wrist.  His pain is localized on the dorsum of the wrist at the wrist/hand junction per the patient.  Denies hitting his head, no other injuries reported.  Past Medical History:  Diagnosis Date   Arthritis    "probably; hands" (04/12/2017)   High cholesterol    History of gout    Hypertension    OSA (obstructive sleep apnea)    per pt noncompliant CPAP has not use it since 2015   Presence of permanent cardiac pacemaker 04/12/2017   Dual Chamber   Wears dentures    upper    Patient Active Problem List   Diagnosis Date Noted   Hypercholesteremia 08/01/2018   Acute biliary pancreatitis 08/17/2017   Pacemaker 04/12/2017   Syncope and collapse 01/23/2017   Second degree atrioventricular block, Mobitz (type) I 01/23/2017   SSS (sick sinus syndrome) ([place]) 01/23/2017   SVT (supraventricular tachycardia) ([place]) 01/23/2017   Other fatigue 01/23/2017   Chest pain 12/28/2016   Essential hypertension 12/28/2016   BRBPR (bright red blood per rectum) 11/04/2010   Mild renal insufficiency  11/04/2010   Sleep apnea 11/04/2010    Past Surgical History:  Procedure Laterality Date   CHOLECYSTECTOMY N/A 08/19/2017   Procedure: LAPAROSCOPIC ASSISTED OPEN CHOLECYSTECTOMY WITH INTRAOPERATIVE CHOLANGIOGRAM;  Surgeon: Armandina Gemma, MD;  [facility];  Service: General;  Laterality: N/A;   ELBOW BURSA SURGERY Left 07-21-2009   extensive bursectomy/ tenosynovectomy/ ulnar nerve release   EUS N/A 08/18/2017   Procedure: ESOPHAGEAL ENDOSCOPIC ULTRASOUND (EUS) RADIAL;  Surgeon: Milus Banister, MD;  [facility];  Service: Endoscopy;  Laterality: N/A;   HAMMER TOE SURGERY Right 2015   HAMMER TOE SURGERY Right 12/11/2014   Procedure: 4th HAMMER TOE CORRECTION, EXCISION LESION 3rd TOE RIGHT FOOT;  Surgeon: Rosemary Holms, DPM;  Location: [facility];  Service: Podiatry;  Laterality: Right;   INSERT / REPLACE / REMOVE PACEMAKER  04/12/2017   Dual Chamber   MICROLARYNGOSCOPY  06-07-2006   w/  Excision bilateral vocal cord lesions (bilateral benign nodules)   PACEMAKER IMPLANT N/A 04/12/2017   Procedure: PACEMAKER IMPLANT - Dual Chamber;  Surgeon: Sanda Klein, MD;  Location: [facility];  Service: Cardiovascular;  Laterality: N/A;   SVT ABLATION N/A 02/14/2020   Procedure: SVT ABLATION;  Surgeon: Vickie Epley, MD;  Location: [facility];  Service: Cardiovascular;  Laterality: N/A;       Home  Medications    Prior to Admission medications   Medication Sig Start Date End Date Taking? Authorizing Provider  amiodarone (PACERONE) 200 MG tablet 1 tablet (200 mg total) daily. 06/10/20   Vickie Epley, MD  amiodarone (PACERONE) 200 MG tablet Take 200 mg by mouth daily.    [provider]  aspirin EC 81 MG tablet Take 81 mg by mouth daily.    [provider]  atorvastatin (LIPITOR) 20 MG tablet Take 20 mg by mouth daily.    [provider]  fluticasone (FLONASE) 50 MCG/ACT nasal spray Place 1 spray into both  nostrils daily as needed for rhinitis.  09/01/19   [provider]  losartan (COZAAR) 50 MG tablet Take 1 tablet (50 mg total) by mouth daily. 02/19/20   Kroeger, Lorelee Cover., PA-C  Multiple Vitamin (MULTIVITAMIN WITH MINERALS) TABS tablet Take 1 tablet by mouth daily.    [provider]  nitroGLYCERIN (NITROSTAT) 0.4 MG SL tablet Place 1 tablet (0.4 mg total) under the tongue every 5 (five) minutes as needed for chest pain. 05/05/20 08/03/20  Vickie Epley, MD  potassium chloride SA (KLOR-CON) 20 MEQ tablet Take 20 mEq by mouth daily. 12/14/18   [provider]  tamsulosin (FLOMAX) 0.4 MG CAPS capsule Take 0.4 mg by mouth daily. 04/03/19   [provider]  triamcinolone cream (KENALOG) 0.5 % Apply 1 application topically in the morning and at bedtime. 02/05/20   [provider]    Family History Family History  Problem Relation Age of Onset   Stroke Mother 62       had gangrene of toe, then later "stroke" per patient   Osteoarthritis Father    Heart attack Father 48   Heart attack Sister        sudden cardiac arrest   Colon cancer Neg Hx     Social History Social History   Tobacco Use   Smoking status: Never   Smokeless tobacco: Never  Vaping Use   Vaping Use: Never used  Substance Use Topics   Alcohol use: Yes    Alcohol/week: 2.0 standard drinks    Types: 2 Standard drinks or equivalent per week   Drug use: No     Allergies   Patient has no known allergies.   Review of Systems Review of Systems  Musculoskeletal:  Positive for arthralgias (right wrist) and joint swelling.  Skin:  Negative for color change, rash and wound.  Neurological:  Negative for dizziness and weakness.    Physical Exam Triage Vital Signs ED Triage Vitals  Enc Vitals Group     BP 04/19/21 0946 (!) 161/71     Pulse Rate 04/19/21 0946 73     Resp 04/19/21 0946 19     Temp 04/19/21 0946 98.6 F (37 C)     Temp Source 04/19/21 0946 Oral      SpO2 04/19/21 0946 96 %     Weight --      Height --      Head Circumference --      Peak Flow --      Pain Score 04/19/21 0945 10     Pain Loc --      Pain Edu? --      Excl. in [place]? --    No data found.  Updated Vital Signs BP (!) 161/71 (BP Location: Left Arm)    Pulse 73    Temp 98.6 F (37 C) (Oral)    Resp 19  SpO2 96%    Physical Exam Gen: Well-appearing, in no acute distress; non-toxic CV: Regular Rate. Well-perfused. Warm.  Resp: Breathing unlabored on room air; no wheezing. Psych: Fluid speech in conversation; appropriate affect; normal thought process Neuro: Sensation intact throughout. No gross coordination deficits.  MSK:  - Right wrist/hand: + TTP noted at the dorsal wrist.  Inspection yields some soft tissue swelling on the dorsum of the wrist.  There are Bouchard's and her bodies nodules throughout the fingers.  He is able to move all 5 fingers without pain or difficulty.  No metacarpal TTP.  No tenderness over the ulnar styloid.  Range of motion is somewhat limited secondary to pain.  Limited wrist extension secondary to pain. ***   UC Treatments / Results  Labs (all labs ordered are listed, but only abnormal results are displayed) Labs Reviewed - No data to display  EKG   Radiology DG Wrist Complete Right  Result Date: 04/19/2021 CLINICAL DATA:  Fall yesterday, pain EXAM: RIGHT WRIST - COMPLETE 3+ VIEW; RIGHT HAND - COMPLETE 3+ VIEW COMPARISON:  None. FINDINGS: The appearance of the thenar aspect of the distal right radius is suspicious for a nondisplaced fracture. No definite fracture or dislocation of the right hand or wrist. Moderate arthrosis about the hand and wrist with periarticular calcifications about the proximal interphalangeal joints and metacarpophalangeal joints, as well as chondrocalcinosis of the radiocarpal joint. Diffuse soft tissue edema about the wrist. IMPRESSION: 1. The appearance of the thenar aspect of the distal right radius is  suspicious for a nondisplaced fracture. 2. No definite or displaced fracture or dislocation of the right hand or wrist. 3. Moderate arthrosis about the hand and wrist with periarticular calcifications, as well as chondrocalcinosis of the radiocarpal joint, constellation of findings suggesting CPPD (pseudogout). 4.  Soft tissue edema. Electronically Signed   By: Delanna Ahmadi M.D.   On: 04/19/2021 10:15   DG Hand Complete Right  Result Date: 04/19/2021 CLINICAL DATA:  Fall yesterday, pain EXAM: RIGHT WRIST - COMPLETE 3+ VIEW; RIGHT HAND - COMPLETE 3+ VIEW COMPARISON:  None. FINDINGS: The appearance of the thenar aspect of the distal right radius is suspicious for a nondisplaced fracture. No definite fracture or dislocation of the right hand or wrist. Moderate arthrosis about the hand and wrist with periarticular calcifications about the proximal interphalangeal joints and metacarpophalangeal joints, as well as chondrocalcinosis of the radiocarpal joint. Diffuse soft tissue edema about the wrist. IMPRESSION: 1. The appearance of the thenar aspect of the distal right radius is suspicious for a nondisplaced fracture. 2. No definite or displaced fracture or dislocation of the right hand or wrist. 3. Moderate arthrosis about the hand and wrist with periarticular calcifications, as well as chondrocalcinosis of the radiocarpal joint, constellation of findings suggesting CPPD (pseudogout). 4.  Soft tissue edema. Electronically Signed   By: Delanna Ahmadi M.D.   On: 04/19/2021 10:15    Procedures Procedures (including critical care time)  Medications Ordered in UC Medications - No data to display  Initial Impression / Assessment and Plan / UC Course  I have reviewed the triage vital signs and the nursing notes.  Pertinent labs & imaging results that were available during my care of the patient were reviewed by me and considered in my medical decision making (see chart for details).     Right distal radius  fracture, extra-articular, non-displaced.  Patient with right wrist pain after [place] injury x1 day.  X-ray demonstrated suspicion for distal radius fracture of  the thenar aspect of the right wrist.  He is tender in this location and upon my radiographic review it does appear he has a nondisplaced fracture of the distal radius, more so of an avulsion fracture as I do not see a fracture line transecting through the entire radius. Patient was placed in a sugar-tong splint.  He may use ice, Tylenol for pain control.  He will follow-up with local orthopedic or sports medicine in the next 1-2 days for follow-up.  Return precautions were provided. Safe for discharge home.  Final Clinical Impressions(s) / UC Diagnoses   Final diagnoses:  Other closed extra-articular fracture of distal end of right radius, initial encounter   Discharge Instructions   None    ED Prescriptions   None    PDMP not reviewed this encounter.

## 2021-04-19 NOTE — Discharge Instructions (Signed)
Follow-up with either sports medicine center or [place] orth[facility].  You will need to call them today to get an appointment sometime tomorrow or Wednesday.  You may ice and/or take Tylenol for any pain control and to help with the swelling.  You are to remain in the splint until you see sports medicine or orthopedics.

## 2021-04-19 NOTE — ED Triage Notes (Signed)
Pt reports yesterday he fell and was trying to catch himself with right hand and hurt his right wrist. Has a brace from home on in triage.

## 2021-04-20 DIAGNOSIS — H02132 Senile ectropion of right lower eyelid: Secondary | ICD-10-CM | POA: Diagnosis not present

## 2021-04-20 DIAGNOSIS — Z09 Encounter for follow-up examination after completed treatment for conditions other than malignant neoplasm: Secondary | ICD-10-CM | POA: Diagnosis not present

## 2021-04-20 DIAGNOSIS — Z950 Presence of cardiac pacemaker: Secondary | ICD-10-CM | POA: Diagnosis not present

## 2021-04-20 DIAGNOSIS — H02135 Senile ectropion of left lower eyelid: Secondary | ICD-10-CM | POA: Diagnosis not present

## 2021-04-21 ENCOUNTER — Ambulatory Visit: Payer: Medicare Other | Admitting: Sports Medicine

## 2021-04-21 ENCOUNTER — Ambulatory Visit (INDEPENDENT_AMBULATORY_CARE_PROVIDER_SITE_OTHER): Payer: Medicare Other

## 2021-04-21 VITALS — BP 134/62 | Ht 71.0 in | Wt 230.0 lb

## 2021-04-21 DIAGNOSIS — I495 Sick sinus syndrome: Secondary | ICD-10-CM

## 2021-04-21 DIAGNOSIS — M25531 Pain in right wrist: Secondary | ICD-10-CM | POA: Diagnosis not present

## 2021-04-21 DIAGNOSIS — S52501A Unspecified fracture of the lower end of right radius, initial encounter for closed fracture: Secondary | ICD-10-CM | POA: Diagnosis not present

## 2021-04-21 LAB — CUP PACEART REMOTE DEVICE CHECK
Battery Remaining Longevity: 100 mo
Battery Voltage: 2.99 V
Brady Statistic AP VP Percent: 66.35 %
Brady Statistic AP VS Percent: 0 %
Brady Statistic AS VP Percent: 33.61 %
Brady Statistic AS VS Percent: 0.03 %
Brady Statistic RA Percent Paced: 66.36 %
Brady Statistic RV Percent Paced: 99.97 %
Date Time Interrogation Session: 20230215011704
Implantable Lead Implant Date: 20190206
Implantable Lead Implant Date: 20190206
Implantable Lead Location: 753859
Implantable Lead Location: 753860
Implantable Lead Model: 5076
Implantable Lead Model: 5076
Implantable Pulse Generator Implant Date: 20190206
Lead Channel Impedance Value: 304 Ohm
Lead Channel Impedance Value: 380 Ohm
Lead Channel Impedance Value: 418 Ohm
Lead Channel Impedance Value: 456 Ohm
Lead Channel Pacing Threshold Amplitude: 0.625 V
Lead Channel Pacing Threshold Amplitude: 0.625 V
Lead Channel Pacing Threshold Pulse Width: 0.4 ms
Lead Channel Pacing Threshold Pulse Width: 0.4 ms
Lead Channel Sensing Intrinsic Amplitude: 2 mV
Lead Channel Sensing Intrinsic Amplitude: 2 mV
Lead Channel Sensing Intrinsic Amplitude: 5.875 mV
Lead Channel Sensing Intrinsic Amplitude: 5.875 mV
Lead Channel Setting Pacing Amplitude: 2 V
Lead Channel Setting Pacing Amplitude: 2.5 V
Lead Channel Setting Pacing Pulse Width: 0.4 ms
Lead Channel Setting Sensing Sensitivity: 0.9 mV

## 2021-04-21 NOTE — Patient Instructions (Addendum)
[name],  It was great to see you today, thank you for letting me participate in your care!  Today, we discussed your right wrist fracture.  It is still too swollen to put into the cast/brace today.  Things for you to do: -Ice the wrist and the hand for 15-20 minutes at least 2-3 times daily -Elevate the wrist while sleeping under a pillow -You can come out of the sling at least 3-4 times daily to move the shoulder and the elbow.  Just do not move the wrist, and keep the wrist splint on.  You will follow-up with me in 1 week sometime in the late morning.  Be sure to get your wrist x-rays prior to seeing me.  If you have any further questions, please give the clinic a call [phone].  [name], DO Sports Medicine Fellow [place]

## 2021-04-21 NOTE — Progress Notes (Signed)
PCP: Merrilee Seashore, MD  Subjective:   HPI: "[name]" is a 81 y.o. male here for right wrist injury.  He was seen at the urgent care on 04/19/2021 after having a [place] injury the day before.  Patient reports he lost his balance and fell to the side and try to catch himself with the right hand with his hand in an extended position and he felt pain at that area on landing.  He had an x-ray that demonstrated a nondisplaced, distal radius fracture and was placed in a sugar-tong splint.  Today, the patient presents with his wife.  He states that his wrist pain is improved with immobilization of the sugar-tong splint.  He has tried icing it a few times.  He is not requiring pain medication at this time.  He does note some swelling of the distal hand.  Denies any new injury.  Independent review of the right wrist x-ray performed on 04/19/2021 demonstrates a distal radius fracture on the thenar aspect, seems to be suggestive of a chauffeurs fracture.  There is no displacement noted.  He does also have significant chondrocalcinosis throughout the wrist joint and some chronic arthritic changes.  Past Medical History:  Diagnosis Date   Arthritis    "probably; hands" (04/12/2017)   High cholesterol    History of gout    Hypertension    OSA (obstructive sleep apnea)    per pt noncompliant CPAP has not use it since 2015   Presence of permanent cardiac pacemaker 04/12/2017   Dual Chamber   Wears dentures    upper    Current Outpatient Medications on File Prior to Visit  Medication Sig Dispense Refill   amiodarone (PACERONE) 200 MG tablet 1 tablet (200 mg total) daily. 270 tablet 1   amiodarone (PACERONE) 200 MG tablet Take 200 mg by mouth daily.     aspirin EC 81 MG tablet Take 81 mg by mouth daily.     atorvastatin (LIPITOR) 20 MG tablet Take 20 mg by mouth daily.     fluticasone (FLONASE) 50 MCG/ACT nasal spray Place 1 spray into both nostrils daily as needed for rhinitis.      losartan (COZAAR)  50 MG tablet Take 1 tablet (50 mg total) by mouth daily. 90 tablet 1   Multiple Vitamin (MULTIVITAMIN WITH MINERALS) TABS tablet Take 1 tablet by mouth daily.     nitroGLYCERIN (NITROSTAT) 0.4 MG SL tablet Place 1 tablet (0.4 mg total) under the tongue every 5 (five) minutes as needed for chest pain. 25 tablet 11   potassium chloride SA (KLOR-CON) 20 MEQ tablet Take 20 mEq by mouth daily.     tamsulosin (FLOMAX) 0.4 MG CAPS capsule Take 0.4 mg by mouth daily.     triamcinolone cream (KENALOG) 0.5 % Apply 1 application topically in the morning and at bedtime.     No current facility-administered medications on file prior to visit.    Past Surgical History:  Procedure Laterality Date   CHOLECYSTECTOMY N/A 08/19/2017   Procedure: LAPAROSCOPIC ASSISTED OPEN CHOLECYSTECTOMY WITH INTRAOPERATIVE CHOLANGIOGRAM;  Surgeon: Armandina Gemma, MD;  Location: [facility];  Service: General;  Laterality: N/A;   ELBOW BURSA SURGERY Left 07-21-2009   extensive bursectomy/ tenosynovectomy/ ulnar nerve release   EUS N/A 08/18/2017   Procedure: ESOPHAGEAL ENDOSCOPIC ULTRASOUND (EUS) RADIAL;  Surgeon: Milus Banister, MD;  Location: [facility];  Service: Endoscopy;  Laterality: N/A;   HAMMER TOE SURGERY Right 2015   HAMMER TOE SURGERY Right 12/11/2014   Procedure:  [place] 3rd TOE RIGHT FOOT;  Surgeon: Rosemary Holms, DPM;  Location: [facility];  Service: Podiatry;  Laterality: Right;   INSERT / REPLACE / REMOVE PACEMAKER  04/12/2017   Dual Chamber   MICROLARYNGOSCOPY  06-07-2006   w/  Excision bilateral vocal cord lesions (bilateral benign nodules)   PACEMAKER IMPLANT N/A 04/12/2017   Procedure: PACEMAKER IMPLANT - Dual Chamber;  Surgeon: Sanda Klein, MD;  Location: [facility];  Service: Cardiovascular;  Laterality: N/A;   SVT ABLATION N/A 02/14/2020   Procedure: SVT ABLATION;  Surgeon: Vickie Epley, MD;  Location: [facility];  Service:  Cardiovascular;  Laterality: N/A;    No Known Allergies  BP 134/62    Ht 5\' 11"  (1.803 m)    Wt 230 lb (104.3 kg)    BMI 32.08 kg/m        Objective:  Physical Exam:  Gen: Well-appearing, in no acute distress; non-toxic CV: Regular Rate. Well-perfused. Warm.  Resp: Breathing unlabored on room air; no wheezing. Psych: Fluid speech in conversation; appropriate affect; normal thought process Neuro: Sensation intact throughout. No gross coordination deficits.  MSK:   - Right wrist: Inspection notes a good amount of soft tissue swelling from the dorsal wrist into the hand up to the metacarpals.  This is pitting in nature.  There is positive TTP noted at the dorsal radius, on the radial side.  There are osteoarthritic nodules noted at the PIP joints of the hand.  He is able to wiggle all 5 fingers without difficulty.  Range of motion and strength testing of the wrist was not performed today given his fracture.  Neurovascular intact distally.    Assessment & Plan:  1.  Distal radius fracture, right - nondisplaced.  Initial x-ray seems to demonstrate a chauffeur's fracture.  Occurred after a [place] injury on 04/18/2021.  I did remove the sugar-tong splint today, however given the extent of his distal forearm and hand swelling, I do not feel like a cast or EXOS brace would be able to be applied today.  Discussed with the patient remaining in the sugar-tong splint with a sling for the next 1 week and following up at that time with a transition hopefully into an Exos short arm wrist brace.  He is to keep the extremity elevated at that time, may ice over top of the splint or of the dorsal hand as able.  May take Tylenol for pain control.  I did place him back in the sugar-tong splint with a sling.  Discussed he may come out of the sling multiple times a day to work gentle range of motion of the shoulder and the elbow to prevent stiffness.  Patient is agreeable to this plan.  We will have him obtain  repeat x-ray of the right wrist in the morning prior to his appointment. Follow-up in 1 week.  [name], DO PGY-4, Sports Medicine Fellow [place]  Addendum:  Patient seen in the office by fellow.  His history, exam, plan of care were precepted with me.  Jared Lemon MD [facility]

## 2021-04-26 DIAGNOSIS — W19XXXA Unspecified fall, initial encounter: Secondary | ICD-10-CM | POA: Diagnosis not present

## 2021-04-26 DIAGNOSIS — S0101XA Laceration without foreign body of scalp, initial encounter: Secondary | ICD-10-CM | POA: Diagnosis not present

## 2021-04-26 NOTE — Progress Notes (Signed)
Remote pacemaker transmission.   

## 2021-04-28 ENCOUNTER — Ambulatory Visit: Payer: Medicare Other | Admitting: Sports Medicine

## 2021-04-28 ENCOUNTER — Other Ambulatory Visit: Payer: Self-pay | Admitting: Medical

## 2021-04-28 ENCOUNTER — Ambulatory Visit
Admission: RE | Admit: 2021-04-28 | Discharge: 2021-04-28 | Disposition: A | Discharge status: Home / Self Care | Payer: Medicare Other | Source: Ambulatory Visit | Attending: Sports Medicine | Admitting: Sports Medicine

## 2021-04-28 VITALS — BP 130/62 | Ht 71.0 in | Wt 230.0 lb

## 2021-04-28 DIAGNOSIS — S52501D Unspecified fracture of the lower end of right radius, subsequent encounter for closed fracture with routine healing: Secondary | ICD-10-CM | POA: Diagnosis not present

## 2021-04-28 DIAGNOSIS — S52501A Unspecified fracture of the lower end of right radius, initial encounter for closed fracture: Secondary | ICD-10-CM | POA: Diagnosis not present

## 2021-04-28 DIAGNOSIS — S52591A Other fractures of lower end of right radius, initial encounter for closed fracture: Secondary | ICD-10-CM | POA: Diagnosis not present

## 2021-04-28 NOTE — Progress Notes (Signed)
PCP: Merrilee Seashore, MD  Subjective:   HPI: [name] is a 81 y.o. male here for follow-up of right distal radius fracture.  Date of initial injury 04/18/2021, with [place] injury.  Diagnosed with a distal radius fracture along the lateral aspect of the metaphysis.  Stable and nondisplaced.  Patient was last seen by me on 04/21/2021.  He had a considerable amount of swelling in the wrist and the hand, so a cast/Exos brace was unable to be placed at that time.  He has been icing and elevating the wrist since that time.  He states his pain is minimal.  Feels stable in the sugar-tong splint.  He does note that the swelling has gone down.  Denies any new injuries.  Getting any medications for the pain.  BP 130/62    Ht 5\' 11"  (1.803 m)    Wt 230 lb (104.3 kg)    BMI 32.08 kg/m   DG Wrist Complete Right CLINICAL DATA:  Distal radius fracture.  EXAM: RIGHT WRIST - COMPLETE 3+ VIEW  COMPARISON:  Right wrist radiographs 04/19/2021  FINDINGS: Nondisplaced fracture along the lateral aspect of the distal radial metaphysis is stable. Overlying vascular calcifications are noted. Degenerative changes in the wrist are otherwise stable. Chondral calcification noted.  IMPRESSION: Stable nondisplaced fracture along the lateral aspect of the distal radial metaphysis.  Electronically Signed   By: San Morelle M.D.   On: 04/28/2021 10:18        Objective:  Physical Exam:  Gen: Well-appearing, in no acute distress; non-toxic CV: Regular Rate. Well-perfused. Warm.  Resp: Breathing unlabored on room air; no wheezing. Psych: Fluid speech in conversation; appropriate affect; normal thought process Neuro: Sensation intact throughout. No gross coordination deficits.  MSK:  - Right wrist: + Mild degree of TTP on the radial aspect of the distal radial metaphysis.  There is some mild soft tissue swelling over this area in the dorsum of the hand, although much improved from last visit.  Able to  wiggle all 5 fingers without difficulty.  There are known osteoarthritic nodules at the PIP joints of the hand.  Radial pulse 2/4.  Sensation to light touch intact at the dorsal and volar aspect of the wrist and hand.   Assessment & Plan:  1.  Distal radius fracture, right - nondisplaced on lateral aspect of radial metaphysis. Repeat x-ray demonstrated stable nondisplaced fracture.  Review the x-ray of the right wrist with [name] today.  Reassuring that it is a stable, nondisplaced fracture that has not deviated or moved since last visit.  His swelling has improved, so we discontinue the sugar-tong splint and put him into a short arm Exos fracture brace.  Patient was fitted for this and found to be comfortable.  He will remain in this continuously, although may take it off to shower if he desires.  He will follow-up in 2 weeks with repeat x-rays at that time to monitor the fracture.  Discussed with him we will shoot for about 5 to 6 weeks of immobilization for healing and then get him into OT/rehab to work on range of motion once healed. If any questions, may return sooner or call.  [name], DO PGY-4, Sports Medicine Fellow [place]  Addendum:  Patient seen in the office by fellow.  His history, exam, plan of care were precepted with me.  [name] MD [facility]

## 2021-04-29 DIAGNOSIS — Z1152 Encounter for screening for COVID-19: Secondary | ICD-10-CM | POA: Diagnosis not present

## 2021-05-05 DIAGNOSIS — S0101XA Laceration without foreign body of scalp, initial encounter: Secondary | ICD-10-CM | POA: Diagnosis not present

## 2021-05-05 DIAGNOSIS — H53483 Generalized contraction of visual field, bilateral: Secondary | ICD-10-CM | POA: Diagnosis not present

## 2021-05-09 DIAGNOSIS — Z1152 Encounter for screening for COVID-19: Secondary | ICD-10-CM | POA: Diagnosis not present

## 2021-05-12 ENCOUNTER — Ambulatory Visit (INDEPENDENT_AMBULATORY_CARE_PROVIDER_SITE_OTHER): Payer: Medicare Other | Admitting: Sports Medicine

## 2021-05-12 ENCOUNTER — Ambulatory Visit
Admission: RE | Admit: 2021-05-12 | Discharge: 2021-05-12 | Disposition: A | Discharge status: Home / Self Care | Payer: Medicare Other | Source: Ambulatory Visit | Attending: Sports Medicine | Admitting: Sports Medicine

## 2021-05-12 VITALS — BP 121/56 | Ht 71.0 in | Wt 230.0 lb

## 2021-05-12 DIAGNOSIS — S52501A Unspecified fracture of the lower end of right radius, initial encounter for closed fracture: Secondary | ICD-10-CM

## 2021-05-12 DIAGNOSIS — S52501D Unspecified fracture of the lower end of right radius, subsequent encounter for closed fracture with routine healing: Secondary | ICD-10-CM | POA: Diagnosis not present

## 2021-05-12 NOTE — Progress Notes (Signed)
PCP: Merrilee Seashore, MD ? ?Subjective:  ? ?HPI: ?[name] is a 81 y.o. male here for follow-up of non-displaced distal radius fracture.  ? ?Initial injury was on 04/18/2021, was seen in the ED the following day and placed in a long-arm splint.  He remained in this due to the swelling until our office visit on 04/28/2021 to which he was transition into an EXOS wrist brace.  He has been wearing this throughout the day, tolerating it well.  He does take it off maybe once a day to be and do some range of motion for the fingers and wrist. He denies any new injury.  ?His pain is 0 at rest.  He is not taking any medication for the pain. ? ?BP (!) 121/56   Ht '5\' 11"'$  (1.803 m)   Wt 230 lb (104.3 kg)   BMI 32.08 kg/m?  ? ?Independent review of right wrist x-ray today, 05/13/2026: 3 views including AP, lateral and oblique films were obtained.  Radiographs demonstrates stable nondisplaced fracture along the lateral aspect of the distal radial metaphysis.  There is a minimal degree of overlying bony callus formation, although clinically not radiographically healed.  There is no further displacement. ? ?    ?Objective:  ?Physical Exam: ? ?Gen: Well-appearing, in no acute distress; non-toxic ?CV: Regular Rate. Well-perfused. Warm.  ?Resp: Breathing unlabored on room air; no wheezing. ?Psych: Fluid speech in conversation; appropriate affect; normal thought process ?Neuro: Sensation intact throughout. No gross coordination deficits.  ?MSK:  ?- Right wrist: Evaluation of the right wrist demonstrates no significant erythema, ecchymosis or swelling.  There is very mild TTP to deep palpation over the distal aspect of the radial metaphysis.  Patient is able to wiggle all 5 fingers without difficulty.  Gentle range of motion active and passively of the wrist does not reproduce pain.  There is no bony deformity.  2/4 radial pulse intact.  ?  ?Assessment & Plan:  ?1. Distal radius fracture, right - nondisplaced on lateral aspect of radial  metaphysis. Repeat x-ray today demonstrates stable nondisplaced fracture with very early evidence of callus formation, although not radiographically healed yet. ? ?-Patient is anxious to have his brace removed, but I discussed with him the timeframe for fracture healing ?-We we will have him remain in the Exos brace for an additional 2 weeks and have him follow-up with me at that time.  Which will be almost 6 weeks out from his injury.  At that point, if clinically he has no pain, we will take him out of the Exos brace.  No indication for repeat imaging unless his exam dictates otherwise. ?-Discussed with the patient that after our next visit he will need to work hard on hand and wrist physical therapy either at home or formalized therapy to help improve his range of motion as he will have some stiffness ?-Follow-up in 2 weeks ? ?[name], DO ?PGY-4, Sports Medicine Fellow ?[place] ? ?Addendum:  I was the preceptor for this visit and available for immediate consultation.  [name] MD CAQSM ?

## 2021-05-14 DIAGNOSIS — Z1152 Encounter for screening for COVID-19: Secondary | ICD-10-CM | POA: Diagnosis not present

## 2021-05-22 DIAGNOSIS — Z1152 Encounter for screening for COVID-19: Secondary | ICD-10-CM | POA: Diagnosis not present

## 2021-05-26 ENCOUNTER — Ambulatory Visit (INDEPENDENT_AMBULATORY_CARE_PROVIDER_SITE_OTHER): Payer: Medicare Other | Admitting: Sports Medicine

## 2021-05-26 VITALS — BP 144/79 | Ht 71.0 in | Wt 230.0 lb

## 2021-05-26 DIAGNOSIS — S52501D Unspecified fracture of the lower end of right radius, subsequent encounter for closed fracture with routine healing: Secondary | ICD-10-CM

## 2021-05-26 NOTE — Progress Notes (Signed)
PCP: Merrilee Seashore, MD ? ?Subjective:  ? ?HPI: ?[name] is a 81 y.o. male here for follow-up of his distal radius fracture, initial injury sustained on 04/18/2021. ? ?He has continued in the Exos wrist brace and has been wearing it throughout the day and at nighttime.  He will take it off once a day as we described to do some range of motion for the fingers.  He denies any new injury.  He is not having any pain about the wrist.  He is not taking any medications because of this.  He is hopeful to come out of the brace for good today. ? ?No Known Allergies ? ?BP (!) 144/79   Ht '5\' 11"'$  (1.803 m)   Wt 230 lb (104.3 kg)   BMI 32.08 kg/m?  ? ? ?    ?Objective:  ?Physical Exam: ? ?Gen: Well-appearing, in no acute distress; non-toxic ?CV: Regular Rate. Well-perfused. Warm.  ?Resp: Breathing unlabored on room air; no wheezing. ?Psych: Fluid speech in conversation; appropriate affect; normal thought process ?Neuro: Sensation intact throughout. No gross coordination deficits.  ?MSK:  ?- Right wrist: Evaluation of the right wrist demonstrates no significant erythema, ecchymosis or swelling.  There is a very mild degree of dry skin over the lateral aspect of the hand.  Very trivial TTP to deep palpation of the distal aspect of the radial metaphysis.  Extension of the wrist approximately 35 degrees and flexion to 40 degrees (about 10 degree difference compared to contralateral wrist).  There is no pain with active range of motion.  Radial pulse 2/4.  Grip strength intact.  Sensation to light touch intact on the dorsal and volar aspect of the wrist and hand. ? ?  ?Assessment & Plan:  ?1. Distal radius fracture, right - nondisplaced on lateral aspect of radial metaphysis.  Patient is almost 6 weeks out from initial injury.  Minimal to no pain at this time.  There is some stiffness noted although to a lesser degree than expected.  ? ?-He may come out of the Exos brace today ?-Did provide the patient with home exercises and  demonstrated wrist extension/flexion and gentle range of motion exercises.  Over the next few weeks he may begin gently adding weight 1-2 pounds to help with strengthening. ?-May use topical Voltaren or diclofenac gel for any residual pain ?-He may follow-up on an as-needed basis, if he has any residual pain or continued stiffness he may return and we may do a trial of formal physical therapy, although I do believe he would do well with a home exercises ? ?[name], DO ?PGY-4, Sports Medicine Fellow ?[place] ? ?This note was dictated using Dragon naturally speaking software and may contain errors in syntax, spelling, or content which have not been identified prior to signing this note.  ? ?Addendum:  I was the preceptor for this visit and available for immediate consultation.  [name] MD CAQSM ? ?

## 2021-05-29 DIAGNOSIS — Z1152 Encounter for screening for COVID-19: Secondary | ICD-10-CM | POA: Diagnosis not present

## 2021-06-01 DIAGNOSIS — L84 Corns and callosities: Secondary | ICD-10-CM | POA: Diagnosis not present

## 2021-06-01 DIAGNOSIS — I70203 Unspecified atherosclerosis of native arteries of extremities, bilateral legs: Secondary | ICD-10-CM | POA: Diagnosis not present

## 2021-06-01 DIAGNOSIS — B351 Tinea unguium: Secondary | ICD-10-CM | POA: Diagnosis not present

## 2021-06-01 DIAGNOSIS — L309 Dermatitis, unspecified: Secondary | ICD-10-CM | POA: Diagnosis not present

## 2021-06-15 DIAGNOSIS — I10 Essential (primary) hypertension: Secondary | ICD-10-CM | POA: Diagnosis not present

## 2021-06-15 DIAGNOSIS — E782 Mixed hyperlipidemia: Secondary | ICD-10-CM | POA: Diagnosis not present

## 2021-06-15 DIAGNOSIS — R5383 Other fatigue: Secondary | ICD-10-CM | POA: Diagnosis not present

## 2021-06-15 DIAGNOSIS — I700 Atherosclerosis of aorta: Secondary | ICD-10-CM | POA: Diagnosis not present

## 2021-06-15 DIAGNOSIS — Z0000 Encounter for general adult medical examination without abnormal findings: Secondary | ICD-10-CM | POA: Diagnosis not present

## 2021-06-15 DIAGNOSIS — I739 Peripheral vascular disease, unspecified: Secondary | ICD-10-CM | POA: Diagnosis not present

## 2021-06-15 DIAGNOSIS — R7303 Prediabetes: Secondary | ICD-10-CM | POA: Diagnosis not present

## 2021-06-20 DIAGNOSIS — Z1152 Encounter for screening for COVID-19: Secondary | ICD-10-CM | POA: Diagnosis not present

## 2021-06-21 DIAGNOSIS — Z20822 Contact with and (suspected) exposure to covid-19: Secondary | ICD-10-CM | POA: Diagnosis not present

## 2021-06-22 DIAGNOSIS — I10 Essential (primary) hypertension: Secondary | ICD-10-CM | POA: Diagnosis not present

## 2021-06-22 DIAGNOSIS — I495 Sick sinus syndrome: Secondary | ICD-10-CM | POA: Diagnosis not present

## 2021-06-22 DIAGNOSIS — Z0000 Encounter for general adult medical examination without abnormal findings: Secondary | ICD-10-CM | POA: Diagnosis not present

## 2021-06-22 DIAGNOSIS — E782 Mixed hyperlipidemia: Secondary | ICD-10-CM | POA: Diagnosis not present

## 2021-06-22 DIAGNOSIS — I872 Venous insufficiency (chronic) (peripheral): Secondary | ICD-10-CM | POA: Diagnosis not present

## 2021-06-22 DIAGNOSIS — R7303 Prediabetes: Secondary | ICD-10-CM | POA: Diagnosis not present

## 2021-06-22 DIAGNOSIS — I739 Peripheral vascular disease, unspecified: Secondary | ICD-10-CM | POA: Diagnosis not present

## 2021-06-22 DIAGNOSIS — M159 Polyosteoarthritis, unspecified: Secondary | ICD-10-CM | POA: Diagnosis not present

## 2021-06-22 DIAGNOSIS — I700 Atherosclerosis of aorta: Secondary | ICD-10-CM | POA: Diagnosis not present

## 2021-06-22 DIAGNOSIS — M1000 Idiopathic gout, unspecified site: Secondary | ICD-10-CM | POA: Diagnosis not present

## 2021-06-25 DIAGNOSIS — Z1152 Encounter for screening for COVID-19: Secondary | ICD-10-CM | POA: Diagnosis not present

## 2021-07-02 ENCOUNTER — Other Ambulatory Visit: Payer: Self-pay | Admitting: Cardiology

## 2021-07-02 DIAGNOSIS — Z1152 Encounter for screening for COVID-19: Secondary | ICD-10-CM | POA: Diagnosis not present

## 2021-07-02 DIAGNOSIS — I495 Sick sinus syndrome: Secondary | ICD-10-CM

## 2021-07-02 DIAGNOSIS — I471 Supraventricular tachycardia: Secondary | ICD-10-CM

## 2021-07-10 DIAGNOSIS — Z1152 Encounter for screening for COVID-19: Secondary | ICD-10-CM | POA: Diagnosis not present

## 2021-07-16 ENCOUNTER — Other Ambulatory Visit: Payer: Self-pay

## 2021-07-16 DIAGNOSIS — I471 Supraventricular tachycardia: Secondary | ICD-10-CM

## 2021-07-16 DIAGNOSIS — I495 Sick sinus syndrome: Secondary | ICD-10-CM

## 2021-07-18 DIAGNOSIS — Z1152 Encounter for screening for COVID-19: Secondary | ICD-10-CM | POA: Diagnosis not present

## 2021-07-20 ENCOUNTER — Other Ambulatory Visit: Payer: Self-pay

## 2021-07-20 DIAGNOSIS — I495 Sick sinus syndrome: Secondary | ICD-10-CM

## 2021-07-20 DIAGNOSIS — I471 Supraventricular tachycardia: Secondary | ICD-10-CM

## 2021-07-21 ENCOUNTER — Ambulatory Visit (INDEPENDENT_AMBULATORY_CARE_PROVIDER_SITE_OTHER): Payer: Medicare Other

## 2021-07-21 DIAGNOSIS — I495 Sick sinus syndrome: Secondary | ICD-10-CM

## 2021-07-22 LAB — CUP PACEART REMOTE DEVICE CHECK
Battery Remaining Longevity: 96 mo
Battery Voltage: 2.98 V
Brady Statistic AP VP Percent: 64.5 %
Brady Statistic AP VS Percent: 0 %
Brady Statistic AS VP Percent: 35.46 %
Brady Statistic AS VS Percent: 0.04 %
Brady Statistic RA Percent Paced: 64.51 %
Brady Statistic RV Percent Paced: 99.96 %
Date Time Interrogation Session: 20230517021411
Implantable Lead Implant Date: 20190206
Implantable Lead Implant Date: 20190206
Implantable Lead Location: 753859
Implantable Lead Location: 753860
Implantable Lead Model: 5076
Implantable Lead Model: 5076
Implantable Pulse Generator Implant Date: 20190206
Lead Channel Impedance Value: 323 Ohm
Lead Channel Impedance Value: 380 Ohm
Lead Channel Impedance Value: 418 Ohm
Lead Channel Impedance Value: 456 Ohm
Lead Channel Pacing Threshold Amplitude: 0.625 V
Lead Channel Pacing Threshold Amplitude: 0.75 V
Lead Channel Pacing Threshold Pulse Width: 0.4 ms
Lead Channel Pacing Threshold Pulse Width: 0.4 ms
Lead Channel Sensing Intrinsic Amplitude: 2.5 mV
Lead Channel Sensing Intrinsic Amplitude: 2.5 mV
Lead Channel Sensing Intrinsic Amplitude: 4.75 mV
Lead Channel Sensing Intrinsic Amplitude: 4.75 mV
Lead Channel Setting Pacing Amplitude: 2 V
Lead Channel Setting Pacing Amplitude: 2.5 V
Lead Channel Setting Pacing Pulse Width: 0.4 ms
Lead Channel Setting Sensing Sensitivity: 0.9 mV

## 2021-07-24 DIAGNOSIS — Z1152 Encounter for screening for COVID-19: Secondary | ICD-10-CM | POA: Diagnosis not present

## 2021-07-27 ENCOUNTER — Telehealth: Payer: Self-pay | Admitting: Cardiovascular Disease

## 2021-07-27 NOTE — Telephone Encounter (Signed)
   Pre-operative Risk Assessment    Patient Name: [name]  DOB: [birth year] MRN: [id number]      Request for Surgical Clearance    Procedure:   bilateral upper eyelid blepharoptosis revision  Date of Surgery:  Clearance 08/10/21                                 Surgeon:  Dr. Isidoro Donning Surgeon's Group or Practice Name:  [facility]  Phone number:  [phone] Fax number:  [phone]   Type of Clearance Requested:   - medical - pacemaker - pharmacy - hold clopidogrel (# days unspecified)   Type of Anesthesia:   not specified   Additional requests/questions:   n/a  Lonia Chimera   07/27/2021, 10:11 AM

## 2021-07-27 NOTE — Telephone Encounter (Signed)
Please call requesting office regarding request to hold Plavix. We do not have Plavix listed as a medication for this patient, and therefore if he has started it it is for a noncardiac diagnosis.  In order to provide medical clearance, we will need to set up a virtual/telephone visit Primary Cardiologist:Mihai Croitoru, MD  Chart reviewed as part of pre-operative protocol coverage. Because of [name]'s past medical history and time since last visit, he/she will require a virtual visit/telephone call in order to better assess preoperative cardiovascular risk.  Pre-op covering staff: - Please contact patient, obtain consent, and schedule appointment   If applicable, this message will also be routed to pharmacy pool and/or primary cardiologist for input on holding anticoagulant/antiplatelet agent as requested below so that this information is available at time of patient's appointment.   Emmaline Life, NP-C    07/27/2021, 12:03 PM [place] [address] Office [phone] Fax [phone]

## 2021-07-28 ENCOUNTER — Telehealth: Payer: Self-pay | Admitting: *Deleted

## 2021-07-28 NOTE — Telephone Encounter (Signed)
Pt agreeable to tele pre op appt 07/29/21 @ 2 pm. Med rec and consent are done.

## 2021-07-28 NOTE — Telephone Encounter (Signed)
Pt agreeable to tele pre op appt 07/29/21 @ 2 pm. Med rec and consent are done.     Patient Consent for Virtual Visit        [name] has provided verbal consent on 07/28/2021 for a virtual visit (video or telephone).   CONSENT FOR VIRTUAL VISIT FOR:  [name]  By participating in this virtual visit I agree to the following:  I hereby voluntarily request, consent and authorize [facility] and its employed or contracted physicians, physician assistants, nurse practitioners or other licensed health care professionals (the Practitioner), to provide me with telemedicine health care services (the "Services") as deemed necessary by the treating Practitioner. I acknowledge and consent to receive the Services by the Practitioner via telemedicine. I understand that the telemedicine visit will involve communicating with the Practitioner through live audiovisual communication technology and the disclosure of certain medical information by electronic transmission. I acknowledge that I have been given the opportunity to request an in-person assessment or other available alternative prior to the telemedicine visit and am voluntarily participating in the telemedicine visit.  I understand that I have the right to withhold or withdraw my consent to the use of telemedicine in the course of my care at any time, without affecting my right to future care or treatment, and that the Practitioner or I may terminate the telemedicine visit at any time. I understand that I have the right to inspect all information obtained and/or recorded in the course of the telemedicine visit and may receive copies of available information for a reasonable fee.  I understand that some of the potential risks of receiving the Services via telemedicine include:  Delay or interruption in medical evaluation due to technological equipment failure or disruption; Information transmitted may not be sufficient (e.g. poor  resolution of images) to allow for appropriate medical decision making by the Practitioner; and/or  In rare instances, security protocols could fail, causing a breach of personal health information.  Furthermore, I acknowledge that it is my responsibility to provide information about my medical history, conditions and care that is complete and accurate to the best of my ability. I acknowledge that Practitioner's advice, recommendations, and/or decision may be based on factors not within their control, such as incomplete or inaccurate data provided by me or distortions of diagnostic images or specimens that may result from electronic transmissions. I understand that the practice of medicine is not an exact science and that Practitioner makes no warranties or guarantees regarding treatment outcomes. I acknowledge that a copy of this consent can be made available to me via my patient portal ([place] [facility]), or I can request a printed copy by calling the office of [facility].    I understand that my insurance will be billed for this visit.   I have read or had this consent read to me. I understand the contents of this consent, which adequately explains the benefits and risks of the Services being provided via telemedicine.  I have been provided ample opportunity to ask questions regarding this consent and the Services and have had my questions answered to my satisfaction. I give my informed consent for the services to be provided through the use of telemedicine in my medical care

## 2021-07-29 ENCOUNTER — Ambulatory Visit (INDEPENDENT_AMBULATORY_CARE_PROVIDER_SITE_OTHER): Payer: Medicare Other | Admitting: General Practice

## 2021-07-29 DIAGNOSIS — Z01810 Encounter for preprocedural cardiovascular examination: Secondary | ICD-10-CM | POA: Diagnosis not present

## 2021-07-29 NOTE — Progress Notes (Signed)
CLEARANCE NOTES HAVE BEEN FAXED TO REQUESTING OFFICE TODAY [phone]

## 2021-07-29 NOTE — Progress Notes (Signed)
Virtual Visit via Telephone Note   Because of [name]'s co-morbid illnesses, he is at least at moderate risk for complications without adequate follow up.  This format is felt to be most appropriate for this patient at this time.  The patient did not have access to video technology/had technical difficulties with video requiring transitioning to audio format only (telephone).  All issues noted in this document were discussed and addressed.  No physical exam could be performed with this format.  Please refer to the patient's chart for his consent to telehealth for [facility].  Evaluation Performed:  Preoperative cardiovascular risk assessment _____________   Date:  07/29/2021   Patient ID:  TYLAND KLEMENS, DOB August 25, 1940, MRN [id number] Patient Location:  Home Provider location:   Office  Primary Care Provider:  Merrilee Seashore, MD Primary Cardiologist:  Sanda Klein, MD  Chief Complaint / Patient Profile   81 y.o. y/o male with a h/o tachycardia, Mobitz type I, PPM, essential hypertension, OSA, hyperlipidemia who is pending bilateral upper eyelid blepharoptosis revision and presents today for telephonic preoperative cardiovascular risk assessment.  Past Medical History    Past Medical History:  Diagnosis Date   Arthritis    "probably; hands" (04/12/2017)   High cholesterol    History of gout    Hypertension    OSA (obstructive sleep apnea)    per pt noncompliant CPAP has not use it since 2015   Presence of permanent cardiac pacemaker 04/12/2017   Dual Chamber   Wears dentures    upper   Past Surgical History:  Procedure Laterality Date   CHOLECYSTECTOMY N/A 08/19/2017   Procedure: LAPAROSCOPIC ASSISTED OPEN CHOLECYSTECTOMY WITH INTRAOPERATIVE CHOLANGIOGRAM;  Surgeon: Armandina Gemma, MD;  [facility]: [facility];  Service: General;  Laterality: N/A;   ELBOW BURSA SURGERY Left 07-21-2009   extensive bursectomy/ tenosynovectomy/ ulnar nerve release   EUS N/A  08/18/2017   Procedure: ESOPHAGEAL ENDOSCOPIC ULTRASOUND (EUS) RADIAL;  Surgeon: Milus Banister, MD;  [facility];  Service: Endoscopy;  Laterality: N/A;   HAMMER TOE SURGERY Right 2015   HAMMER TOE SURGERY Right 12/11/2014   Procedure: 4th HAMMER TOE CORRECTION, EXCISION LESION 3rd TOE RIGHT FOOT;  Surgeon: Rosemary Holms, DPM;  Location: [place];  Service: Podiatry;  Laterality: Right;   INSERT / REPLACE / REMOVE PACEMAKER  04/12/2017   Dual Chamber   MICROLARYNGOSCOPY  06-07-2006   w/  Excision bilateral vocal cord lesions (bilateral benign nodules)   PACEMAKER IMPLANT N/A 04/12/2017   Procedure: PACEMAKER IMPLANT - Dual Chamber;  Surgeon: Sanda Klein, MD;  Location: [place] [facility];  Service: Cardiovascular;  Laterality: N/A;   SVT ABLATION N/A 02/14/2020   Procedure: SVT ABLATION;  Surgeon: Vickie Epley, MD;  Location: [place] [facility];  Service: Cardiovascular;  Laterality: N/A;    Allergies  No Known Allergies  History of Present Illness    [name] is a 81 y.o. male who presents via audio/video conferencing for a telehealth visit today.  Pt was last seen in cardiology clinic on 10/26/2020 by Dr. Sallyanne Kuster.  At that time EUGENE ISADORE was doing well .  The patient is now pending procedure as outlined above. Since his last visit, he remains stable from a cardiac standpoint.  Today he denies chest pain, shortness of breath, lower extremity edema, fatigue, palpitations, melena, hematuria, hemoptysis, diaphoresis, weakness, presyncope, syncope, orthopnea, and PND.   Home Medications    Prior to Admission medications  Medication Sig Start Date End Date Taking? Authorizing Provider  amiodarone (PACERONE) 200 MG tablet TAKE 1 TABLET BY MOUTH EVERY DAY 07/02/21   Vickie Epley, MD  aspirin EC 81 MG tablet Take 81 mg by mouth daily.    [provider]  atorvastatin (LIPITOR) 20 MG tablet Take 20 mg by mouth daily.     [provider]  fluticasone (FLONASE) 50 MCG/ACT nasal spray Place 1 spray into both nostrils daily as needed for rhinitis.  09/01/19   [provider]  losartan (COZAAR) 50 MG tablet TAKE 1 TABLET BY MOUTH EVERY DAY 04/28/21   Kroeger, Lorelee Cover., PA-C  Multiple Vitamin (MULTIVITAMIN WITH MINERALS) TABS tablet Take 1 tablet by mouth daily.    [provider]  nitroGLYCERIN (NITROSTAT) 0.4 MG SL tablet Place 1 tablet (0.4 mg total) under the tongue every 5 (five) minutes as needed for chest pain. 05/05/20 07/28/21  Vickie Epley, MD  potassium chloride SA (KLOR-CON) 20 MEQ tablet Take 20 mEq by mouth daily. 12/14/18   [provider]  tamsulosin (FLOMAX) 0.4 MG CAPS capsule Take 0.4 mg by mouth daily. 04/03/19   [provider]  triamcinolone cream (KENALOG) 0.5 % Apply 1 application topically in the morning and at bedtime. 02/05/20   [provider]    Physical Exam    Vital Signs:  BARRINGTON WORLEY does not have vital signs available for review today.  Given telephonic nature of communication, physical exam is limited. AAOx3. NAD. Normal affect.  Speech and respirations are unlabored.  Accessory Clinical Findings    None  Assessment & Plan    1.  Preoperative Cardiovascular Risk Assessment: Bilateral upper eyelid blepharoptosis  revision; Dr.Zaldivar     Primary Cardiologist: Sanda Klein, MD  Chart reviewed as part of pre-operative protocol coverage. Given past medical history and time since last visit, based on ACC/AHA guidelines, AGUSTIN SWATEK would be at acceptable risk for the planned procedure without further cardiovascular testing.   Patient was advised that if he develops new symptoms prior to surgery to contact our office to arrange a follow-up appointment.  He verbalized understanding.   His RCRI is a class II risk, 0.9% risk of major cardiac event.  He is able to complete greater than 4 METS of physical  activity.  We do not prescribe the patient's clopidogrel.  Recommendations for holding clopidogrel will need to come from prescribing provider.    A copy of this note will be routed to requesting surgeon.  Time:   Today, I have spent 5 minutes with the patient with telehealth technology discussing medical history, symptoms, and management plan.  Prior to his phone evaluation I spent greater than 10 minutes reviewing his past medical history and medications.   Deberah Pelton, NP  07/29/2021, 8:16 AM

## 2021-07-30 DIAGNOSIS — Z1152 Encounter for screening for COVID-19: Secondary | ICD-10-CM | POA: Diagnosis not present

## 2021-08-03 DIAGNOSIS — L84 Corns and callosities: Secondary | ICD-10-CM | POA: Diagnosis not present

## 2021-08-03 DIAGNOSIS — B351 Tinea unguium: Secondary | ICD-10-CM | POA: Diagnosis not present

## 2021-08-03 DIAGNOSIS — L309 Dermatitis, unspecified: Secondary | ICD-10-CM | POA: Diagnosis not present

## 2021-08-04 NOTE — Progress Notes (Signed)
Remote pacemaker transmission.   

## 2021-08-10 DIAGNOSIS — H02132 Senile ectropion of right lower eyelid: Secondary | ICD-10-CM | POA: Diagnosis not present

## 2021-08-10 DIAGNOSIS — Z950 Presence of cardiac pacemaker: Secondary | ICD-10-CM | POA: Diagnosis not present

## 2021-08-10 DIAGNOSIS — H02135 Senile ectropion of left lower eyelid: Secondary | ICD-10-CM | POA: Diagnosis not present

## 2021-08-10 DIAGNOSIS — H0289 Other specified disorders of eyelid: Secondary | ICD-10-CM | POA: Diagnosis not present

## 2021-08-10 DIAGNOSIS — H02413 Mechanical ptosis of bilateral eyelids: Secondary | ICD-10-CM | POA: Diagnosis not present

## 2021-08-16 ENCOUNTER — Other Ambulatory Visit: Payer: Self-pay

## 2021-08-16 DIAGNOSIS — I471 Supraventricular tachycardia: Secondary | ICD-10-CM

## 2021-08-16 DIAGNOSIS — I495 Sick sinus syndrome: Secondary | ICD-10-CM

## 2021-08-16 MED ORDER — AMIODARONE HCL 200 MG PO TABS: ORAL_TABLET | ORAL | 0 refills | Status: DC

## 2021-08-24 ENCOUNTER — Other Ambulatory Visit: Payer: Self-pay | Admitting: Cardiology

## 2021-08-24 DIAGNOSIS — I495 Sick sinus syndrome: Secondary | ICD-10-CM

## 2021-08-24 DIAGNOSIS — I471 Supraventricular tachycardia: Secondary | ICD-10-CM

## 2021-09-10 ENCOUNTER — Encounter: Payer: Self-pay | Admitting: Cardiology

## 2021-09-10 DIAGNOSIS — Z1152 Encounter for screening for COVID-19: Secondary | ICD-10-CM | POA: Diagnosis not present

## 2021-09-14 ENCOUNTER — Other Ambulatory Visit: Payer: Self-pay | Admitting: Cardiology

## 2021-09-14 DIAGNOSIS — I495 Sick sinus syndrome: Secondary | ICD-10-CM

## 2021-09-14 DIAGNOSIS — I471 Supraventricular tachycardia: Secondary | ICD-10-CM

## 2021-09-16 DIAGNOSIS — Z1152 Encounter for screening for COVID-19: Secondary | ICD-10-CM | POA: Diagnosis not present

## 2021-09-20 DIAGNOSIS — Z20822 Contact with and (suspected) exposure to covid-19: Secondary | ICD-10-CM | POA: Diagnosis not present

## 2021-10-01 ENCOUNTER — Other Ambulatory Visit: Payer: Self-pay | Admitting: Cardiology

## 2021-10-01 DIAGNOSIS — I495 Sick sinus syndrome: Secondary | ICD-10-CM

## 2021-10-01 DIAGNOSIS — I471 Supraventricular tachycardia: Secondary | ICD-10-CM

## 2021-10-09 DIAGNOSIS — Z1152 Encounter for screening for COVID-19: Secondary | ICD-10-CM | POA: Diagnosis not present

## 2021-10-21 ENCOUNTER — Ambulatory Visit (INDEPENDENT_AMBULATORY_CARE_PROVIDER_SITE_OTHER): Payer: Medicare Other

## 2021-10-21 DIAGNOSIS — I495 Sick sinus syndrome: Secondary | ICD-10-CM | POA: Diagnosis not present

## 2021-10-21 LAB — CUP PACEART REMOTE DEVICE CHECK
Battery Remaining Longevity: 91 mo
Battery Voltage: 2.98 V
Brady Statistic AP VP Percent: 68.41 %
Brady Statistic AP VS Percent: 0.01 %
Brady Statistic AS VP Percent: 31.45 %
Brady Statistic AS VS Percent: 0.14 %
Brady Statistic RA Percent Paced: 68.48 %
Brady Statistic RV Percent Paced: 99.85 %
Date Time Interrogation Session: 20230817021541
Implantable Lead Implant Date: 20190206
Implantable Lead Implant Date: 20190206
Implantable Lead Location: 753859
Implantable Lead Location: 753860
Implantable Lead Model: 5076
Implantable Lead Model: 5076
Implantable Pulse Generator Implant Date: 20190206
Lead Channel Impedance Value: 304 Ohm
Lead Channel Impedance Value: 380 Ohm
Lead Channel Impedance Value: 418 Ohm
Lead Channel Impedance Value: 456 Ohm
Lead Channel Pacing Threshold Amplitude: 0.625 V
Lead Channel Pacing Threshold Amplitude: 0.625 V
Lead Channel Pacing Threshold Pulse Width: 0.4 ms
Lead Channel Pacing Threshold Pulse Width: 0.4 ms
Lead Channel Sensing Intrinsic Amplitude: 2.125 mV
Lead Channel Sensing Intrinsic Amplitude: 2.125 mV
Lead Channel Sensing Intrinsic Amplitude: 6 mV
Lead Channel Sensing Intrinsic Amplitude: 6 mV
Lead Channel Setting Pacing Amplitude: 2 V
Lead Channel Setting Pacing Amplitude: 2.5 V
Lead Channel Setting Pacing Pulse Width: 0.4 ms
Lead Channel Setting Sensing Sensitivity: 0.9 mV

## 2021-11-02 DIAGNOSIS — I70203 Unspecified atherosclerosis of native arteries of extremities, bilateral legs: Secondary | ICD-10-CM | POA: Diagnosis not present

## 2021-11-02 DIAGNOSIS — E1142 Type 2 diabetes mellitus with diabetic polyneuropathy: Secondary | ICD-10-CM | POA: Diagnosis not present

## 2021-11-02 DIAGNOSIS — L309 Dermatitis, unspecified: Secondary | ICD-10-CM | POA: Diagnosis not present

## 2021-11-02 DIAGNOSIS — L603 Nail dystrophy: Secondary | ICD-10-CM | POA: Diagnosis not present

## 2021-11-02 DIAGNOSIS — Z1152 Encounter for screening for COVID-19: Secondary | ICD-10-CM | POA: Diagnosis not present

## 2021-11-08 DIAGNOSIS — Z1152 Encounter for screening for COVID-19: Secondary | ICD-10-CM | POA: Diagnosis not present

## 2021-11-16 NOTE — Progress Notes (Signed)
Remote pacemaker transmission.   

## 2021-11-26 DIAGNOSIS — Z1152 Encounter for screening for COVID-19: Secondary | ICD-10-CM | POA: Diagnosis not present

## 2021-11-27 DIAGNOSIS — Z1152 Encounter for screening for COVID-19: Secondary | ICD-10-CM | POA: Diagnosis not present

## 2021-12-02 DIAGNOSIS — Z1152 Encounter for screening for COVID-19: Secondary | ICD-10-CM | POA: Diagnosis not present

## 2021-12-10 DIAGNOSIS — Z1152 Encounter for screening for COVID-19: Secondary | ICD-10-CM | POA: Diagnosis not present

## 2021-12-16 DIAGNOSIS — Z1152 Encounter for screening for COVID-19: Secondary | ICD-10-CM | POA: Diagnosis not present

## 2021-12-24 DIAGNOSIS — Z1152 Encounter for screening for COVID-19: Secondary | ICD-10-CM | POA: Diagnosis not present

## 2021-12-28 DIAGNOSIS — I700 Atherosclerosis of aorta: Secondary | ICD-10-CM | POA: Diagnosis not present

## 2021-12-28 DIAGNOSIS — R7303 Prediabetes: Secondary | ICD-10-CM | POA: Diagnosis not present

## 2021-12-28 DIAGNOSIS — I739 Peripheral vascular disease, unspecified: Secondary | ICD-10-CM | POA: Diagnosis not present

## 2021-12-28 DIAGNOSIS — M1000 Idiopathic gout, unspecified site: Secondary | ICD-10-CM | POA: Diagnosis not present

## 2021-12-30 DIAGNOSIS — Z1152 Encounter for screening for COVID-19: Secondary | ICD-10-CM | POA: Diagnosis not present

## 2022-01-04 DIAGNOSIS — I495 Sick sinus syndrome: Secondary | ICD-10-CM | POA: Diagnosis not present

## 2022-01-04 DIAGNOSIS — I872 Venous insufficiency (chronic) (peripheral): Secondary | ICD-10-CM | POA: Diagnosis not present

## 2022-01-04 DIAGNOSIS — I739 Peripheral vascular disease, unspecified: Secondary | ICD-10-CM | POA: Diagnosis not present

## 2022-01-04 DIAGNOSIS — R7303 Prediabetes: Secondary | ICD-10-CM | POA: Diagnosis not present

## 2022-01-04 DIAGNOSIS — M159 Polyosteoarthritis, unspecified: Secondary | ICD-10-CM | POA: Diagnosis not present

## 2022-01-04 DIAGNOSIS — E782 Mixed hyperlipidemia: Secondary | ICD-10-CM | POA: Diagnosis not present

## 2022-01-04 DIAGNOSIS — I10 Essential (primary) hypertension: Secondary | ICD-10-CM | POA: Diagnosis not present

## 2022-01-04 DIAGNOSIS — I700 Atherosclerosis of aorta: Secondary | ICD-10-CM | POA: Diagnosis not present

## 2022-01-04 DIAGNOSIS — Z23 Encounter for immunization: Secondary | ICD-10-CM | POA: Diagnosis not present

## 2022-01-04 DIAGNOSIS — M1000 Idiopathic gout, unspecified site: Secondary | ICD-10-CM | POA: Diagnosis not present

## 2022-01-07 DIAGNOSIS — Z1152 Encounter for screening for COVID-19: Secondary | ICD-10-CM | POA: Diagnosis not present

## 2022-01-20 ENCOUNTER — Ambulatory Visit (INDEPENDENT_AMBULATORY_CARE_PROVIDER_SITE_OTHER): Payer: Medicare Other

## 2022-01-20 DIAGNOSIS — Z1152 Encounter for screening for COVID-19: Secondary | ICD-10-CM | POA: Diagnosis not present

## 2022-01-20 DIAGNOSIS — I495 Sick sinus syndrome: Secondary | ICD-10-CM

## 2022-01-20 LAB — CUP PACEART REMOTE DEVICE CHECK
Battery Remaining Longevity: 87 mo
Battery Voltage: 2.98 V
Brady Statistic AP VP Percent: 70.53 %
Brady Statistic AP VS Percent: 0 %
Brady Statistic AS VP Percent: 29.36 %
Brady Statistic AS VS Percent: 0.11 %
Brady Statistic RA Percent Paced: 70.59 %
Brady Statistic RV Percent Paced: 99.89 %
Date Time Interrogation Session: 20231116010156
Implantable Lead Connection Status: 753985
Implantable Lead Connection Status: 753985
Implantable Lead Implant Date: 20190206
Implantable Lead Implant Date: 20190206
Implantable Lead Location: 753859
Implantable Lead Location: 753860
Implantable Lead Model: 5076
Implantable Lead Model: 5076
Implantable Pulse Generator Implant Date: 20190206
Lead Channel Impedance Value: 323 Ohm
Lead Channel Impedance Value: 380 Ohm
Lead Channel Impedance Value: 418 Ohm
Lead Channel Impedance Value: 475 Ohm
Lead Channel Pacing Threshold Amplitude: 0.5 V
Lead Channel Pacing Threshold Amplitude: 0.625 V
Lead Channel Pacing Threshold Pulse Width: 0.4 ms
Lead Channel Pacing Threshold Pulse Width: 0.4 ms
Lead Channel Sensing Intrinsic Amplitude: 1.75 mV
Lead Channel Sensing Intrinsic Amplitude: 1.75 mV
Lead Channel Sensing Intrinsic Amplitude: 5.375 mV
Lead Channel Sensing Intrinsic Amplitude: 5.375 mV
Lead Channel Setting Pacing Amplitude: 2 V
Lead Channel Setting Pacing Amplitude: 2.5 V
Lead Channel Setting Pacing Pulse Width: 0.4 ms
Lead Channel Setting Sensing Sensitivity: 0.9 mV
Zone Setting Status: 755011
Zone Setting Status: 755011

## 2022-01-21 DIAGNOSIS — Z1152 Encounter for screening for COVID-19: Secondary | ICD-10-CM | POA: Diagnosis not present

## 2022-02-02 DIAGNOSIS — L309 Dermatitis, unspecified: Secondary | ICD-10-CM | POA: Diagnosis not present

## 2022-02-02 DIAGNOSIS — B351 Tinea unguium: Secondary | ICD-10-CM | POA: Diagnosis not present

## 2022-02-02 DIAGNOSIS — L84 Corns and callosities: Secondary | ICD-10-CM | POA: Diagnosis not present

## 2022-02-04 DIAGNOSIS — Z1152 Encounter for screening for COVID-19: Secondary | ICD-10-CM | POA: Diagnosis not present

## 2022-02-09 NOTE — Progress Notes (Signed)
Remote pacemaker transmission.   

## 2022-02-11 DIAGNOSIS — H01024 Squamous blepharitis left upper eyelid: Secondary | ICD-10-CM | POA: Diagnosis not present

## 2022-02-11 DIAGNOSIS — H02834 Dermatochalasis of left upper eyelid: Secondary | ICD-10-CM | POA: Diagnosis not present

## 2022-02-11 DIAGNOSIS — H02832 Dermatochalasis of right lower eyelid: Secondary | ICD-10-CM | POA: Diagnosis not present

## 2022-02-11 DIAGNOSIS — H40053 Ocular hypertension, bilateral: Secondary | ICD-10-CM | POA: Diagnosis not present

## 2022-02-11 DIAGNOSIS — H40033 Anatomical narrow angle, bilateral: Secondary | ICD-10-CM | POA: Diagnosis not present

## 2022-02-11 DIAGNOSIS — H01021 Squamous blepharitis right upper eyelid: Secondary | ICD-10-CM | POA: Diagnosis not present

## 2022-02-11 DIAGNOSIS — H02835 Dermatochalasis of left lower eyelid: Secondary | ICD-10-CM | POA: Diagnosis not present

## 2022-02-11 DIAGNOSIS — H02423 Myogenic ptosis of bilateral eyelids: Secondary | ICD-10-CM | POA: Diagnosis not present

## 2022-02-11 DIAGNOSIS — H02831 Dermatochalasis of right upper eyelid: Secondary | ICD-10-CM | POA: Diagnosis not present

## 2022-02-11 DIAGNOSIS — Z961 Presence of intraocular lens: Secondary | ICD-10-CM | POA: Diagnosis not present

## 2022-02-11 DIAGNOSIS — H5703 Miosis: Secondary | ICD-10-CM | POA: Diagnosis not present

## 2022-02-14 DIAGNOSIS — Z1152 Encounter for screening for COVID-19: Secondary | ICD-10-CM | POA: Diagnosis not present

## 2022-02-17 DIAGNOSIS — R3121 Asymptomatic microscopic hematuria: Secondary | ICD-10-CM | POA: Diagnosis not present

## 2022-02-18 DIAGNOSIS — Z1152 Encounter for screening for COVID-19: Secondary | ICD-10-CM | POA: Diagnosis not present

## 2022-03-10 DIAGNOSIS — N200 Calculus of kidney: Secondary | ICD-10-CM | POA: Diagnosis not present

## 2022-03-10 DIAGNOSIS — R310 Gross hematuria: Secondary | ICD-10-CM | POA: Diagnosis not present

## 2022-03-11 DIAGNOSIS — M160 Bilateral primary osteoarthritis of hip: Secondary | ICD-10-CM | POA: Diagnosis not present

## 2022-03-11 DIAGNOSIS — K5730 Diverticulosis of large intestine without perforation or abscess without bleeding: Secondary | ICD-10-CM | POA: Diagnosis not present

## 2022-03-11 DIAGNOSIS — M533 Sacrococcygeal disorders, not elsewhere classified: Secondary | ICD-10-CM | POA: Diagnosis not present

## 2022-03-11 DIAGNOSIS — N200 Calculus of kidney: Secondary | ICD-10-CM | POA: Diagnosis not present

## 2022-04-20 DIAGNOSIS — I8312 Varicose veins of left lower extremity with inflammation: Secondary | ICD-10-CM | POA: Diagnosis not present

## 2022-04-21 ENCOUNTER — Ambulatory Visit (INDEPENDENT_AMBULATORY_CARE_PROVIDER_SITE_OTHER): Payer: Medicare Other

## 2022-04-21 DIAGNOSIS — I495 Sick sinus syndrome: Secondary | ICD-10-CM

## 2022-04-22 LAB — CUP PACEART REMOTE DEVICE CHECK
Battery Remaining Longevity: 81 mo
Battery Voltage: 2.98 V
Brady Statistic AP VP Percent: 70.14 %
Brady Statistic AP VS Percent: 0.01 %
Brady Statistic AS VP Percent: 29.79 %
Brady Statistic AS VS Percent: 0.06 %
Brady Statistic RA Percent Paced: 70.17 %
Brady Statistic RV Percent Paced: 99.93 %
Date Time Interrogation Session: 20240215011436
Implantable Lead Connection Status: 753985
Implantable Lead Connection Status: 753985
Implantable Lead Implant Date: 20190206
Implantable Lead Implant Date: 20190206
Implantable Lead Location: 753859
Implantable Lead Location: 753860
Implantable Lead Model: 5076
Implantable Lead Model: 5076
Implantable Pulse Generator Implant Date: 20190206
Lead Channel Impedance Value: 304 Ohm
Lead Channel Impedance Value: 380 Ohm
Lead Channel Impedance Value: 399 Ohm
Lead Channel Impedance Value: 456 Ohm
Lead Channel Pacing Threshold Amplitude: 0.625 V
Lead Channel Pacing Threshold Amplitude: 0.75 V
Lead Channel Pacing Threshold Pulse Width: 0.4 ms
Lead Channel Pacing Threshold Pulse Width: 0.4 ms
Lead Channel Sensing Intrinsic Amplitude: 1.875 mV
Lead Channel Sensing Intrinsic Amplitude: 1.875 mV
Lead Channel Sensing Intrinsic Amplitude: 5.75 mV
Lead Channel Sensing Intrinsic Amplitude: 5.75 mV
Lead Channel Setting Pacing Amplitude: 2 V
Lead Channel Setting Pacing Amplitude: 2.5 V
Lead Channel Setting Pacing Pulse Width: 0.4 ms
Lead Channel Setting Sensing Sensitivity: 0.9 mV
Zone Setting Status: 755011
Zone Setting Status: 755011

## 2022-04-26 ENCOUNTER — Telehealth: Payer: Self-pay | Admitting: *Deleted

## 2022-04-26 NOTE — Patient Outreach (Signed)
  Care Coordination   Initial Visit Note   04/26/2022 Name: [name] MRN: [id number] DOB: 1941/02/15  [name] is a 82 y.o. year old male who sees Jared Seashore, MD for primary care. I spoke with  [name] by phone today.  What matters to the patients health and wellness today?  Overall health maintenance.    Goals Addressed   None     SDOH assessments and interventions completed:  No     Care Coordination Interventions:  No, not indicated   Follow up plan: Follow up call scheduled for 04/29/22 with [name], RNCM    Encounter Outcome:  Pt. Visit Completed

## 2022-04-29 ENCOUNTER — Ambulatory Visit: Payer: Self-pay

## 2022-04-29 ENCOUNTER — Telehealth: Payer: Self-pay

## 2022-04-29 NOTE — Patient Outreach (Signed)
  Care Coordination   04/29/2022 Name: [name] MRN: [id number] DOB: 03-Oct-1940   Care Coordination Outreach Attempts:  An unsuccessful telephone outreach was attempted today to offer the patient information about available care coordination services as a benefit of their health plan.   Follow Up Plan:  Additional outreach attempts will be made to offer the patient care coordination information and services.   Encounter Outcome:  No Answer   Care Coordination Interventions:  No, not indicated    Jone Baseman, RN, MSN [facility] Care Management Coordinator Direct Line [phone]

## 2022-04-29 NOTE — Patient Instructions (Signed)
Visit Information  Thank you for taking time to visit with me today. Please don't hesitate to contact me if I can be of assistance to you.   Following are the goals we discussed today:   Goals Addressed             This Visit's Progress    Hypertension Management       Patient Goals/Self Care Activities: -Patient/Caregiver will self-administer medications as prescribed as evidenced by self-report/primary caregiver report  -Patient/Caregiver will attend all scheduled provider appointments as evidenced by clinician review of documented attendance to scheduled appointments and patient/caregiver report -Patient/Caregiver will call provider office for new concerns or questions as evidenced by review of documented incoming telephone call notes and patient report  -Calls provider office for new concerns, questions, or BP outside discussed parameters -Checks BP and records as discussed -Follows a low sodium diet/DASH diet   Interventions Today    Flowsheet Row Most Recent Value  Chronic Disease   Chronic disease during today's visit Hypertension (HTN)  General Interventions   General Interventions Discussed/Reviewed General Interventions Discussed, Doctor Visits  Doctor Visits Discussed/Reviewed Doctor Visits Discussed, Annual Wellness Visits  [AWV 06/15/21]  Exercise Interventions   Exercise Discussed/Reviewed Exercise Discussed  Education Interventions   Education Provided Provided Education  Provided Verbal Education On Nutrition, Medication  Nutrition Interventions   Nutrition Discussed/Reviewed Nutrition Discussed, Decreasing salt  Pharmacy Interventions   Pharmacy Dicussed/Reviewed Medications and their functions, Pharmacy Topics Discussed  Safety Interventions   Safety Discussed/Reviewed Safety Discussed, Home Safety  Advanced Directive Interventions   Advanced Directives Discussed/Reviewed Advanced Directives Discussed              Our next appointment is by  telephone on 06/24/22 at 1100  Please call the care guide team at [phone] if you need to cancel or reschedule your appointment.   If you are experiencing a Mental Health or [place] or need someone to talk to, please call the Suicide and Crisis Lifeline: 988   The patient verbalized understanding of instructions, educational materials, and care plan provided today and agreed to receive a mailed copy of patient instructions, educational materials, and care plan.   The patient has been provided with contact information for the care management team and has been advised to call with any health related questions or concerns.   Jone Baseman, RN, MSN [facility] Care Management Coordinator Direct Line [phone]

## 2022-04-29 NOTE — Patient Outreach (Signed)
  Care Coordination   Initial Visit Note   04/29/2022 Name: [name] MRN: [id number] DOB: 1940-07-18  [name] is a 82 y.o. year old male who sees Merrilee Seashore, MD for primary care. I spoke with  Gerald Dexter by phone today.  What matters to the patients health and wellness today?  Health Maintenance    Goals Addressed             This Visit's Progress    Hypertension Management       Patient Goals/Self Care Activities: -Patient/Caregiver will self-administer medications as prescribed as evidenced by self-report/primary caregiver report  -Patient/Caregiver will attend all scheduled provider appointments as evidenced by clinician review of documented attendance to scheduled appointments and patient/caregiver report -Patient/Caregiver will call provider office for new concerns or questions as evidenced by review of documented incoming telephone call notes and patient report  -Calls provider office for new concerns, questions, or BP outside discussed parameters -Checks BP and records as discussed -Follows a low sodium diet/DASH diet   Interventions Today    Flowsheet Row Most Recent Value  Chronic Disease   Chronic disease during today's visit Hypertension (HTN)  General Interventions   General Interventions Discussed/Reviewed General Interventions Discussed, Doctor Visits  Doctor Visits Discussed/Reviewed Doctor Visits Discussed, Annual Wellness Visits  [AWV 06/15/21]  Exercise Interventions   Exercise Discussed/Reviewed Exercise Discussed  Education Interventions   Education Provided Provided Education  Provided Verbal Education On Nutrition, Medication  Nutrition Interventions   Nutrition Discussed/Reviewed Nutrition Discussed, Decreasing salt  Pharmacy Interventions   Pharmacy Dicussed/Reviewed Medications and their functions, Pharmacy Topics Discussed  Safety Interventions   Safety Discussed/Reviewed Safety Discussed, Home Safety  Advanced  Directive Interventions   Advanced Directives Discussed/Reviewed Advanced Directives Discussed              SDOH assessments and interventions completed:  Yes  SDOH Interventions Today    Flowsheet Row Most Recent Value  SDOH Interventions   Food Insecurity Interventions Intervention Not Indicated  Housing Interventions Intervention Not Indicated        Care Coordination Interventions:  Yes, provided   Follow up plan: Follow up call scheduled for April    Encounter Outcome:  Pt. Visit Completed   Jone Baseman, RN, MSN [place] [facility] Care Management Coordinator Direct Line [phone]

## 2022-05-05 DIAGNOSIS — L309 Dermatitis, unspecified: Secondary | ICD-10-CM | POA: Diagnosis not present

## 2022-05-05 DIAGNOSIS — I70203 Unspecified atherosclerosis of native arteries of extremities, bilateral legs: Secondary | ICD-10-CM | POA: Diagnosis not present

## 2022-05-05 DIAGNOSIS — B351 Tinea unguium: Secondary | ICD-10-CM | POA: Diagnosis not present

## 2022-05-09 ENCOUNTER — Ambulatory Visit: Payer: Medicare Other | Attending: Cardiovascular Disease | Admitting: Cardiovascular Disease

## 2022-05-09 ENCOUNTER — Encounter: Payer: Self-pay | Admitting: Cardiovascular Disease

## 2022-05-09 VITALS — BP 112/50 | HR 78 | Ht 70.0 in | Wt 211.4 lb

## 2022-05-09 DIAGNOSIS — E7800 Pure hypercholesterolemia, unspecified: Secondary | ICD-10-CM | POA: Diagnosis not present

## 2022-05-09 DIAGNOSIS — G4733 Obstructive sleep apnea (adult) (pediatric): Secondary | ICD-10-CM

## 2022-05-09 DIAGNOSIS — I441 Atrioventricular block, second degree: Secondary | ICD-10-CM

## 2022-05-09 DIAGNOSIS — I4719 Other supraventricular tachycardia: Secondary | ICD-10-CM

## 2022-05-09 DIAGNOSIS — Z79899 Other long term (current) drug therapy: Secondary | ICD-10-CM | POA: Diagnosis not present

## 2022-05-09 DIAGNOSIS — Z5181 Encounter for therapeutic drug level monitoring: Secondary | ICD-10-CM | POA: Diagnosis not present

## 2022-05-09 DIAGNOSIS — Z950 Presence of cardiac pacemaker: Secondary | ICD-10-CM | POA: Diagnosis not present

## 2022-05-09 DIAGNOSIS — I495 Sick sinus syndrome: Secondary | ICD-10-CM

## 2022-05-09 DIAGNOSIS — I10 Essential (primary) hypertension: Secondary | ICD-10-CM | POA: Diagnosis not present

## 2022-05-09 NOTE — Patient Instructions (Signed)
Medication Instructions:  Stop taking Potassium *If you need a refill on your cardiac medications before your next appointment, please call your pharmacy*  Follow-Up: At [facility], you and your health needs are our priority.  As part of our continuing mission to provide you with exceptional heart care, we have created designated Provider Care Teams.  These Care Teams include your primary Cardiologist (physician) and Advanced Practice Providers (APPs -  Physician Assistants and Nurse Practitioners) who all work together to provide you with the care you need, when you need it.  We recommend signing up for the patient portal called "MyChart".  Sign up information is provided on this After Visit Summary.  MyChart is used to connect with patients for Virtual Visits (Telemedicine).  Patients are able to view lab/test results, encounter notes, upcoming appointments, etc.  Non-urgent messages can be sent to your provider as well.   To learn more about what you can do with MyChart, go to [email or web address].    Your next appointment:   1 year(s) - Device visit  Provider:   Sanda Klein, MD

## 2022-05-09 NOTE — Progress Notes (Signed)
Cardiology office note   Date:  05/14/2022   ID:  Brode, Obenauer 08/24/1940, MRN [id number]   PCP:  Merrilee Seashore, MD  Cardiologist:  Sanda Klein, MD  Electrophysiologist:  Vickie Epley, MD   Evaluation Performed:  Follow-Up Visit  Chief Complaint: Pacemaker follow-up  History of Present Illness:    [name] is a 82 y.o. male with sick sinus syndrome and tachycardia-bradycardia syndrome, paroxysmal atrial tachycardia (possible AVNRT), second-degree atrioventricular block with bradycardia and near syncope for which he received a pacemaker in February 2019.  Additional problems include mild obesity, hyperlipidemia, essential hypertension, obstructive sleep apnea, gout.  Slow pathway radiofrequency ablation was attempted 02/14/2020 by Dr. Quentin Ore, but abandoned due to the high risk of complete heart block.  He was started on amiodarone at that time.  He has done quite well from a cardiovascular point of view since that time.  He has not had any episodes of syncope or major problems with palpitations.  He denies shortness of breath or chest pain at rest or with exertion, orthopnea, PND, edema (he occasionally has mild left ankle swelling, site of organized clot in one of the popliteal veins from remote DVT).  He remains very active (pacemaker shows 7-8 hours of activity most weeks)..  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  Presenting rhythm is atrial sensed ventricular paced.  He has 67% atrial pacing and 100% ventricular pacing due to second-degree AV block.  The paced QRS is quite poor at 196 ms.  Underlying rhythm is atrial sensed, ventricular sensed at 75 bpm with an AV delay of 430 ms.  The device was reprogrammed AAIR-DDDR at 60 bpm lower rate limit, allowing maximum AV delay of 500 ms in an effort to reduce ventricular pacing.  He has not had any recent episodes of SVT or high ventricular rates.  Generator longevity is  estimated at over 8 years and he has excellent lead parameters.  He has had recent labs with Dr. Ashby Dawes with " all results normal".     Past Medical History:  Diagnosis Date   Arthritis    "probably; hands" (04/12/2017)   High cholesterol    History of gout    Hypertension    OSA (obstructive sleep apnea)    per pt noncompliant CPAP has not use it since 2015   Presence of permanent cardiac pacemaker 04/12/2017   Dual Chamber   Wears dentures    upper   Past Surgical History:  Procedure Laterality Date   CHOLECYSTECTOMY N/A 08/19/2017   Procedure: LAPAROSCOPIC ASSISTED OPEN CHOLECYSTECTOMY WITH INTRAOPERATIVE CHOLANGIOGRAM;  Surgeon: Armandina Gemma, MD;  [facility];  Service: General;  Laterality: N/A;   ELBOW BURSA SURGERY Left 07-21-2009   extensive bursectomy/ tenosynovectomy/ ulnar nerve release   EUS N/A 08/18/2017   Procedure: ESOPHAGEAL ENDOSCOPIC ULTRASOUND (EUS) RADIAL;  Surgeon: Milus Banister, MD;  [facility];  Service: Endoscopy;  Laterality: N/A;   HAMMER TOE SURGERY Right 2015   HAMMER TOE SURGERY Right 12/11/2014   Procedure: 4th HAMMER TOE CORRECTION, EXCISION LESION 3rd TOE RIGHT FOOT;  Surgeon: Rosemary Holms, DPM;  Location: [place];  Service: Podiatry;  Laterality: Right;   INSERT / REPLACE / REMOVE PACEMAKER  04/12/2017   Dual Chamber   MICROLARYNGOSCOPY  06-07-2006   w/  Excision bilateral vocal cord lesions (bilateral benign nodules)   PACEMAKER IMPLANT N/A 04/12/2017   Procedure: PACEMAKER IMPLANT - Dual Chamber;  Surgeon: Sanda Klein, MD;  Location: [place] [facility];  Service: Cardiovascular;  Laterality: N/A;   SVT ABLATION N/A 02/14/2020   Procedure: SVT ABLATION;  Surgeon: Vickie Epley, MD;  Location: [place] [facility];  Service: Cardiovascular;  Laterality: N/A;     Current Meds  Medication Sig   amiodarone (PACERONE) 200 MG tablet TAKE 1 TABLET BY MOUTH EVERY DAY   aspirin EC 81 MG tablet Take  81 mg by mouth daily.   atorvastatin (LIPITOR) 40 MG tablet Take 1 tablet by mouth daily.   fluticasone (FLONASE) 50 MCG/ACT nasal spray Place 1 spray into both nostrils daily as needed for rhinitis.    losartan (COZAAR) 50 MG tablet TAKE 1 TABLET BY MOUTH EVERY DAY   Multiple Vitamin (MULTIVITAMIN WITH MINERALS) TABS tablet Take 1 tablet by mouth daily.   tamsulosin (FLOMAX) 0.4 MG CAPS capsule Take 0.4 mg by mouth daily.   triamcinolone cream (KENALOG) 0.5 % Apply 1 application topically in the morning and at bedtime.   [DISCONTINUED] potassium chloride SA (KLOR-CON) 20 MEQ tablet Take 20 mEq by mouth daily.     Allergies:   Patient has no known allergies.   Social History   Tobacco Use   Smoking status: Never   Smokeless tobacco: Never  Vaping Use   Vaping Use: Never used  Substance Use Topics   Alcohol use: Yes    Alcohol/week: 2.0 standard drinks of alcohol    Types: 2 Standard drinks or equivalent per week   Drug use: No     Family Hx: The patient's family history includes Heart attack in his sister; Heart attack (age of onset: 79) in his father; Osteoarthritis in his father; Stroke (age of onset: 9) in his mother. There is no history of Colon cancer.  ROS:   Please see the history of present illness.   All other systems are reviewed and are negative.   Prior CV studies:   The following studies were reviewed today:  Comprehensive pacemaker check performed today  Labs/Other Tests and Data Reviewed:    EKG: Ordered today and personally reviewed shows AV sequential pacing.  Recent Labs: No results found for requested labs within last 365 days.  05/25/2020: Hemoglobin A1c 6.3%, creatinine 1.3, ALT 27, TSH 1.8.  Recent Lipid Panel 04/26/2019 total cholesterol 126, HDL 52, LDL 74, triglycerides 43. 05/25/2020 Cholesterol 151, HDL 71, LDL 70, triglycerides 43   Wt Readings from Last 3 Encounters:  05/09/22 211 lb 6.4 oz (95.9 kg)  05/26/21 230 lb (104.3 kg)   05/12/21 230 lb (104.3 kg)     Objective:    Vital Signs:  BP (!) 112/50 (BP Location: Left Arm, Patient Position: Sitting, Cuff Size: Large)   Pulse 78   Ht '5\' 10"'$  (1.778 m)   Wt 211 lb 6.4 oz (95.9 kg)   SpO2 96%   BMI 30.33 kg/m     General: Alert, oriented x3, no distress, healthy pacemaker site Head: no evidence of trauma, PERRL, EOMI, has some recurrent bilateral palpebral ptosis no exophtalmos or lid lag, no myxedema, no xanthelasma; normal ears, nose and oropharynx Neck: normal jugular venous pulsations and no hepatojugular reflux; brisk carotid pulses without delay and no carotid bruits Chest: clear to auscultation, no signs of consolidation by percussion or palpation, normal fremitus, symmetrical and full respiratory excursions Cardiovascular: normal position and quality of the apical impulse, regular rhythm, normal first and second heart sounds, no murmurs, rubs or gallops Abdomen: no tenderness or distention,  no masses by palpation, no abnormal pulsatility or arterial bruits, normal bowel sounds, no hepatosplenomegaly Extremities: no clubbing, cyanosis or edema; 2+ radial, ulnar and brachial pulses bilaterally; 2+ right femoral, posterior tibial and dorsalis pedis pulses; 2+ left femoral, posterior tibial and dorsalis pedis pulses; no subclavian or femoral bruits Neurological: grossly nonfocal Psych: Normal mood and affect   ASSESSMENT & PLAN:    1. AVNRT (AV nodal re-entry tachycardia)   2. SSS (sick sinus syndrome) ([place])   3. Second degree atrioventricular block, Mobitz (type) I   4. Pacemaker   5. Encounter for monitoring amiodarone therapy   6. Essential hypertension   7. OSA (obstructive sleep apnea)   8. Hypercholesteremia      AVNRT: No events have been recorded since May 2023.  Although EP study demonstrated dual pathway AV node physiology, ablation could not be successfully performed.  He has been asymptomatic and arrhythmia free on amiodarone. 2nd deg  AVB, MT1: Today he has background rhythm was sinus rhythm with 1: 1 AV conduction, but with a very long first-degree AV block at 430 ms.  He has not had syncope or near syncope since the pacemaker was implanted.  He has virtually 100% ventricular pacing due to second-degree AV block and now amiodarone therapy, although he is not truly pacemaker dependent. PM: Changed device settings to AAIR-DDDR to see if we can get any intrinsic AV conduction.  Normal device function.  Continue remote downloads every 3 months. Amiodarone: So far well-tolerated without side effects, thyroid and liver function tests have been monitored by Dr. Ashby Dawes. HTN: Very well-controlled.  His diastolic blood pressure is actually little low, but he has no complaints of hypotension.  If he develops dizziness or frank hypotension recommend reducing the dose of losartan.  He is no longer taking a diuretic.  Recommend discontinuing the potassium supplement. OSA: Mostly compliant with CPAP.  Denies daytime hypersomnolence. HLP: On statin, labs monitored by Dr. Ashby Dawes.  Patient Instructions  Medication Instructions:  Stop taking Potassium *If you need a refill on your cardiac medications before your next appointment, please call your pharmacy*  Follow-Up: At [facility], you and your health needs are our priority.  As part of our continuing mission to provide you with exceptional heart care, we have created designated Provider Care Teams.  These Care Teams include your primary Cardiologist (physician) and Advanced Practice Providers (APPs -  Physician Assistants and Nurse Practitioners) who all work together to provide you with the care you need, when you need it.  We recommend signing up for the patient portal called "MyChart".  Sign up information is provided on this After Visit Summary.  MyChart is used to connect with patients for Virtual Visits (Telemedicine).  Patients are able to view lab/test results,  encounter notes, upcoming appointments, etc.  Non-urgent messages can be sent to your provider as well.   To learn more about what you can do with MyChart, go to [email or web address].    Your next appointment:   1 year(s) - Device visit  Provider:   Sanda Klein, MD         Signed, Sanda Klein, MD  05/14/2022 5:58 PM    [facility]

## 2022-05-17 NOTE — Progress Notes (Signed)
Remote pacemaker transmission.   

## 2022-05-31 ENCOUNTER — Telehealth: Payer: Self-pay | Admitting: *Deleted

## 2022-05-31 NOTE — Patient Outreach (Signed)
  Care Coordination   Initial Visit Note   05/31/2022 Name: [name] MRN: [id number] DOB: 03/10/1940  [name] is a 82 y.o. year old male who sees Merrilee Seashore, MD for primary care. I spoke with  Gerald Dexter by phone today.  What matters to the patients health and wellness today?  Reminded pt of his [facility] and [facility] Care Coordination program- reports he is active now with RNCM.    Goals Addressed   None     SDOH assessments and interventions completed:  Yes  SDOH Interventions Today    Flowsheet Row Most Recent Value  SDOH Interventions   Food Insecurity Interventions Intervention Not Indicated  Housing Interventions Intervention Not Indicated  Transportation Interventions Intervention Not Indicated  Utilities Interventions Intervention Not Indicated        Care Coordination Interventions:  No, not indicated   Follow up plan: No further intervention required.   Encounter Outcome:  Pt. Visit Completed

## 2022-06-01 DIAGNOSIS — R5383 Other fatigue: Secondary | ICD-10-CM | POA: Diagnosis not present

## 2022-06-01 DIAGNOSIS — G4733 Obstructive sleep apnea (adult) (pediatric): Secondary | ICD-10-CM | POA: Diagnosis not present

## 2022-06-13 ENCOUNTER — Telehealth: Payer: Self-pay

## 2022-06-13 NOTE — Patient Instructions (Signed)
Visit Information  Thank you for taking time to visit with me today. Please don't hesitate to contact me if I can be of assistance to you.   Following are the goals we discussed today:   Goals Addressed             This Visit's Progress    Hypertension Management       Patient Goals/Self Care Activities: -Patient/Caregiver will self-administer medications as prescribed as evidenced by self-report/primary caregiver report  -Patient/Caregiver will attend all scheduled provider appointments as evidenced by clinician review of documented attendance to scheduled appointments and patient/caregiver report -Patient/Caregiver will call provider office for new concerns or questions as evidenced by review of documented incoming telephone call notes and patient report  -Calls provider office for new concerns, questions, or BP outside discussed parameters -Checks BP and records as discussed -Follows a low sodium diet/DASH diet   Interventions Today    Flowsheet Row Most Recent Value  Chronic Disease   Chronic disease during today's visit Hypertension (HTN)  General Interventions   General Interventions Discussed/Reviewed General Interventions Discussed  Exercise Interventions   Exercise Discussed/Reviewed Exercise Discussed  Education Interventions   Education Provided Provided Education  Provided Verbal Education On Nutrition, Medication  Nutrition Interventions   Nutrition Discussed/Reviewed Nutrition Discussed, Decreasing salt  Pharmacy Interventions   Pharmacy Dicussed/Reviewed Pharmacy Topics Discussed, Medications and their functions              Our next appointment is by telephone on 08/15/22 at 1100  Please call the care guide team at [phone] if you need to cancel or reschedule your appointment.   If you are experiencing a Mental Health or Behavioral Health Crisis or need someone to talk to, please call the Suicide and Crisis Lifeline: 988   The patient verbalized  understanding of instructions, educational materials, and care plan provided today and DECLINED offer to receive copy of patient instructions, educational materials, and care plan.   The patient has been provided with contact information for the care management team and has been advised to call with any health related questions or concerns.   Bary Leriche, RN, MSN [facility] Care Management Care Management Coordinator Direct Line [phone]

## 2022-06-13 NOTE — Patient Outreach (Signed)
  Care Coordination   Follow Up Visit Note   06/13/2022 Name: [name] MRN: [id number] DOB: 11-24-1940  [name] is a 82 y.o. year old male who sees Georgianne Fick, MD for primary care. I spoke with  Maceo Pro by phone today.  What matters to the patients health and wellness today?  Maintain health    Goals Addressed             This Visit's Progress    Hypertension Management       Patient Goals/Self Care Activities: -Patient/Caregiver will self-administer medications as prescribed as evidenced by self-report/primary caregiver report  -Patient/Caregiver will attend all scheduled provider appointments as evidenced by clinician review of documented attendance to scheduled appointments and patient/caregiver report -Patient/Caregiver will call provider office for new concerns or questions as evidenced by review of documented incoming telephone call notes and patient report  -Calls provider office for new concerns, questions, or BP outside discussed parameters -Checks BP and records as discussed -Follows a low sodium diet/DASH diet   Interventions Today    Flowsheet Row Most Recent Value  Chronic Disease   Chronic disease during today's visit Hypertension (HTN)  General Interventions   General Interventions Discussed/Reviewed General Interventions Discussed  Exercise Interventions   Exercise Discussed/Reviewed Exercise Discussed  Education Interventions   Education Provided Provided Education  Provided Verbal Education On Nutrition, Medication  Nutrition Interventions   Nutrition Discussed/Reviewed Nutrition Discussed, Decreasing salt  Pharmacy Interventions   Pharmacy Dicussed/Reviewed Pharmacy Topics Discussed, Medications and their functions              SDOH assessments and interventions completed:  Yes     Care Coordination Interventions:  Yes, provided   Follow up plan: Follow up call scheduled for June    Encounter Outcome:   Pt. Visit Completed   Bary Leriche, RN, MSN [facility] Care Management Care Management Coordinator Direct Line [phone]

## 2022-06-21 DIAGNOSIS — E1142 Type 2 diabetes mellitus with diabetic polyneuropathy: Secondary | ICD-10-CM | POA: Diagnosis not present

## 2022-06-21 DIAGNOSIS — L309 Dermatitis, unspecified: Secondary | ICD-10-CM | POA: Diagnosis not present

## 2022-06-21 DIAGNOSIS — L03031 Cellulitis of right toe: Secondary | ICD-10-CM | POA: Diagnosis not present

## 2022-06-28 DIAGNOSIS — M159 Polyosteoarthritis, unspecified: Secondary | ICD-10-CM | POA: Diagnosis not present

## 2022-06-28 DIAGNOSIS — I700 Atherosclerosis of aorta: Secondary | ICD-10-CM | POA: Diagnosis not present

## 2022-06-28 DIAGNOSIS — M1000 Idiopathic gout, unspecified site: Secondary | ICD-10-CM | POA: Diagnosis not present

## 2022-06-28 DIAGNOSIS — E782 Mixed hyperlipidemia: Secondary | ICD-10-CM | POA: Diagnosis not present

## 2022-06-28 DIAGNOSIS — I495 Sick sinus syndrome: Secondary | ICD-10-CM | POA: Diagnosis not present

## 2022-06-28 DIAGNOSIS — I10 Essential (primary) hypertension: Secondary | ICD-10-CM | POA: Diagnosis not present

## 2022-06-28 DIAGNOSIS — R7303 Prediabetes: Secondary | ICD-10-CM | POA: Diagnosis not present

## 2022-06-28 DIAGNOSIS — I739 Peripheral vascular disease, unspecified: Secondary | ICD-10-CM | POA: Diagnosis not present

## 2022-06-28 DIAGNOSIS — I872 Venous insufficiency (chronic) (peripheral): Secondary | ICD-10-CM | POA: Diagnosis not present

## 2022-07-05 DIAGNOSIS — L03031 Cellulitis of right toe: Secondary | ICD-10-CM | POA: Diagnosis not present

## 2022-07-05 DIAGNOSIS — I495 Sick sinus syndrome: Secondary | ICD-10-CM | POA: Diagnosis not present

## 2022-07-05 DIAGNOSIS — I739 Peripheral vascular disease, unspecified: Secondary | ICD-10-CM | POA: Diagnosis not present

## 2022-07-05 DIAGNOSIS — I700 Atherosclerosis of aorta: Secondary | ICD-10-CM | POA: Diagnosis not present

## 2022-07-05 DIAGNOSIS — L309 Dermatitis, unspecified: Secondary | ICD-10-CM | POA: Diagnosis not present

## 2022-07-05 DIAGNOSIS — M159 Polyosteoarthritis, unspecified: Secondary | ICD-10-CM | POA: Diagnosis not present

## 2022-07-05 DIAGNOSIS — I872 Venous insufficiency (chronic) (peripheral): Secondary | ICD-10-CM | POA: Diagnosis not present

## 2022-07-05 DIAGNOSIS — N1831 Chronic kidney disease, stage 3a: Secondary | ICD-10-CM | POA: Diagnosis not present

## 2022-07-05 DIAGNOSIS — Z0000 Encounter for general adult medical examination without abnormal findings: Secondary | ICD-10-CM | POA: Diagnosis not present

## 2022-07-05 DIAGNOSIS — M1000 Idiopathic gout, unspecified site: Secondary | ICD-10-CM | POA: Diagnosis not present

## 2022-07-05 DIAGNOSIS — I70203 Unspecified atherosclerosis of native arteries of extremities, bilateral legs: Secondary | ICD-10-CM | POA: Diagnosis not present

## 2022-07-05 DIAGNOSIS — E782 Mixed hyperlipidemia: Secondary | ICD-10-CM | POA: Diagnosis not present

## 2022-07-05 DIAGNOSIS — R7303 Prediabetes: Secondary | ICD-10-CM | POA: Diagnosis not present

## 2022-07-05 DIAGNOSIS — E1142 Type 2 diabetes mellitus with diabetic polyneuropathy: Secondary | ICD-10-CM | POA: Diagnosis not present

## 2022-07-05 DIAGNOSIS — I10 Essential (primary) hypertension: Secondary | ICD-10-CM | POA: Diagnosis not present

## 2022-07-21 ENCOUNTER — Ambulatory Visit (INDEPENDENT_AMBULATORY_CARE_PROVIDER_SITE_OTHER): Payer: Medicare Other

## 2022-07-21 DIAGNOSIS — I495 Sick sinus syndrome: Secondary | ICD-10-CM | POA: Diagnosis not present

## 2022-07-21 DIAGNOSIS — E1142 Type 2 diabetes mellitus with diabetic polyneuropathy: Secondary | ICD-10-CM | POA: Diagnosis not present

## 2022-07-21 DIAGNOSIS — L03032 Cellulitis of left toe: Secondary | ICD-10-CM | POA: Diagnosis not present

## 2022-07-21 DIAGNOSIS — L03031 Cellulitis of right toe: Secondary | ICD-10-CM | POA: Diagnosis not present

## 2022-07-21 DIAGNOSIS — L309 Dermatitis, unspecified: Secondary | ICD-10-CM | POA: Diagnosis not present

## 2022-07-21 DIAGNOSIS — I70203 Unspecified atherosclerosis of native arteries of extremities, bilateral legs: Secondary | ICD-10-CM | POA: Diagnosis not present

## 2022-07-21 LAB — CUP PACEART REMOTE DEVICE CHECK
Battery Remaining Longevity: 73 mo
Battery Voltage: 2.97 V
Brady Statistic AP VP Percent: 82.21 %
Brady Statistic AP VS Percent: 0.37 %
Brady Statistic AS VP Percent: 8.1 %
Brady Statistic AS VS Percent: 9.32 %
Brady Statistic RA Percent Paced: 83.73 %
Brady Statistic RV Percent Paced: 90.31 %
Date Time Interrogation Session: 20240516021410
Implantable Lead Connection Status: 753985
Implantable Lead Connection Status: 753985
Implantable Lead Implant Date: 20190206
Implantable Lead Implant Date: 20190206
Implantable Lead Location: 753859
Implantable Lead Location: 753860
Implantable Lead Model: 5076
Implantable Lead Model: 5076
Implantable Pulse Generator Implant Date: 20190206
Lead Channel Impedance Value: 304 Ohm
Lead Channel Impedance Value: 342 Ohm
Lead Channel Impedance Value: 361 Ohm
Lead Channel Impedance Value: 399 Ohm
Lead Channel Pacing Threshold Amplitude: 0.5 V
Lead Channel Pacing Threshold Amplitude: 0.75 V
Lead Channel Pacing Threshold Pulse Width: 0.4 ms
Lead Channel Pacing Threshold Pulse Width: 0.4 ms
Lead Channel Sensing Intrinsic Amplitude: 2.25 mV
Lead Channel Sensing Intrinsic Amplitude: 2.25 mV
Lead Channel Sensing Intrinsic Amplitude: 4.875 mV
Lead Channel Sensing Intrinsic Amplitude: 4.875 mV
Lead Channel Setting Pacing Amplitude: 2 V
Lead Channel Setting Pacing Amplitude: 2.5 V
Lead Channel Setting Pacing Pulse Width: 0.4 ms
Lead Channel Setting Sensing Sensitivity: 0.9 mV
Zone Setting Status: 755011
Zone Setting Status: 755011

## 2022-08-04 NOTE — Progress Notes (Signed)
Remote pacemaker transmission.   

## 2022-08-15 ENCOUNTER — Ambulatory Visit: Payer: Self-pay

## 2022-08-15 NOTE — Patient Outreach (Signed)
  Care Coordination   Follow Up Visit Note   08/15/2022 Name: [name] MRN: [id number] DOB: 07-07-1940  [name] is a 82 y.o. year old male who sees Jared Fick, MD for primary care. I spoke with  Maceo Pro by phone today.  What matters to the patients health and wellness today?  none    Goals Addressed             This Visit's Progress    Hypertension Management       Patient Goals/Self Care Activities: -Patient/Caregiver will self-administer medications as prescribed as evidenced by self-report/primary caregiver report  -Patient/Caregiver will attend all scheduled provider appointments as evidenced by clinician review of documented attendance to scheduled appointments and patient/caregiver report -Patient/Caregiver will call provider office for new concerns or questions as evidenced by review of documented incoming telephone call notes and patient report  -Calls provider office for new concerns, questions, or BP outside discussed parameters -Checks BP and records as discussed -Follows a low sodium diet/DASH diet   Patient reports doing good.  Blood pressure less than 140/80.  Discussed HTN management and diet.  No concerns.          SDOH assessments and interventions completed:  Yes  SDOH Interventions Today    Flowsheet Row Most Recent Value  SDOH Interventions   Food Insecurity Interventions Intervention Not Indicated  Utilities Interventions Intervention Not Indicated        Care Coordination Interventions:  Yes, provided   Follow up plan: Follow up call scheduled for September    Encounter Outcome:  Pt. Visit Completed   Bary Leriche, RN, MSN [facility] Care Management Care Management Coordinator Direct Line [phone]

## 2022-08-15 NOTE — Patient Instructions (Signed)
Visit Information  Thank you for taking time to visit with me today. Please don't hesitate to contact me if I can be of assistance to you.   Following are the goals we discussed today:   Goals Addressed             This Visit's Progress    Hypertension Management       Patient Goals/Self Care Activities: -Patient/Caregiver will self-administer medications as prescribed as evidenced by self-report/primary caregiver report  -Patient/Caregiver will attend all scheduled provider appointments as evidenced by clinician review of documented attendance to scheduled appointments and patient/caregiver report -Patient/Caregiver will call provider office for new concerns or questions as evidenced by review of documented incoming telephone call notes and patient report  -Calls provider office for new concerns, questions, or BP outside discussed parameters -Checks BP and records as discussed -Follows a low sodium diet/DASH diet   Patient reports doing good.  Blood pressure less than 140/80.  Discussed HTN management and diet.  No concerns.          Our next appointment is by telephone on 11/14/22 at 1030 am  Please call the care guide team at [phone] if you need to cancel or reschedule your appointment.   If you are experiencing a Mental Health or Behavioral Health Crisis or need someone to talk to, please call the Suicide and Crisis Lifeline: 988   The patient verbalized understanding of instructions, educational materials, and care plan provided today and DECLINED offer to receive copy of patient instructions, educational materials, and care plan.   The patient has been provided with contact information for the care management team and has been advised to call with any health related questions or concerns.   Bary Leriche, RN, MSN [facility] Care Management Care Management Coordinator Direct Line [phone]

## 2022-08-17 DIAGNOSIS — L03031 Cellulitis of right toe: Secondary | ICD-10-CM | POA: Diagnosis not present

## 2022-08-17 DIAGNOSIS — M792 Neuralgia and neuritis, unspecified: Secondary | ICD-10-CM | POA: Diagnosis not present

## 2022-08-17 DIAGNOSIS — E1142 Type 2 diabetes mellitus with diabetic polyneuropathy: Secondary | ICD-10-CM | POA: Diagnosis not present

## 2022-08-17 DIAGNOSIS — B351 Tinea unguium: Secondary | ICD-10-CM | POA: Diagnosis not present

## 2022-08-17 DIAGNOSIS — L309 Dermatitis, unspecified: Secondary | ICD-10-CM | POA: Diagnosis not present

## 2022-08-17 DIAGNOSIS — I70203 Unspecified atherosclerosis of native arteries of extremities, bilateral legs: Secondary | ICD-10-CM | POA: Diagnosis not present

## 2022-09-09 DIAGNOSIS — N200 Calculus of kidney: Secondary | ICD-10-CM | POA: Diagnosis not present

## 2022-09-19 DIAGNOSIS — H01021 Squamous blepharitis right upper eyelid: Secondary | ICD-10-CM | POA: Diagnosis not present

## 2022-09-19 DIAGNOSIS — H02834 Dermatochalasis of left upper eyelid: Secondary | ICD-10-CM | POA: Diagnosis not present

## 2022-09-19 DIAGNOSIS — H40053 Ocular hypertension, bilateral: Secondary | ICD-10-CM | POA: Diagnosis not present

## 2022-09-19 DIAGNOSIS — H01024 Squamous blepharitis left upper eyelid: Secondary | ICD-10-CM | POA: Diagnosis not present

## 2022-09-19 DIAGNOSIS — H02831 Dermatochalasis of right upper eyelid: Secondary | ICD-10-CM | POA: Diagnosis not present

## 2022-09-19 DIAGNOSIS — H02832 Dermatochalasis of right lower eyelid: Secondary | ICD-10-CM | POA: Diagnosis not present

## 2022-09-19 DIAGNOSIS — Z961 Presence of intraocular lens: Secondary | ICD-10-CM | POA: Diagnosis not present

## 2022-09-19 DIAGNOSIS — H02835 Dermatochalasis of left lower eyelid: Secondary | ICD-10-CM | POA: Diagnosis not present

## 2022-09-19 DIAGNOSIS — H40033 Anatomical narrow angle, bilateral: Secondary | ICD-10-CM | POA: Diagnosis not present

## 2022-10-09 ENCOUNTER — Other Ambulatory Visit: Payer: Self-pay | Admitting: Cardiovascular Disease

## 2022-10-09 DIAGNOSIS — I495 Sick sinus syndrome: Secondary | ICD-10-CM

## 2022-10-09 DIAGNOSIS — I471 Supraventricular tachycardia, unspecified: Secondary | ICD-10-CM

## 2022-10-20 ENCOUNTER — Ambulatory Visit (INDEPENDENT_AMBULATORY_CARE_PROVIDER_SITE_OTHER): Payer: Medicare Other

## 2022-10-20 DIAGNOSIS — I495 Sick sinus syndrome: Secondary | ICD-10-CM | POA: Diagnosis not present

## 2022-10-20 LAB — CUP PACEART REMOTE DEVICE CHECK
Battery Remaining Longevity: 66 mo
Battery Voltage: 2.96 V
Brady Statistic AP VP Percent: 88.42 %
Brady Statistic AP VS Percent: 0.24 %
Brady Statistic AS VP Percent: 6.12 %
Brady Statistic AS VS Percent: 5.22 %
Brady Statistic RA Percent Paced: 89.33 %
Brady Statistic RV Percent Paced: 94.53 %
Date Time Interrogation Session: 20240815021336
Implantable Lead Connection Status: 753985
Implantable Lead Connection Status: 753985
Implantable Lead Implant Date: 20190206
Implantable Lead Implant Date: 20190206
Implantable Lead Location: 753859
Implantable Lead Location: 753860
Implantable Lead Model: 5076
Implantable Lead Model: 5076
Implantable Pulse Generator Implant Date: 20190206
Lead Channel Impedance Value: 304 Ohm
Lead Channel Impedance Value: 342 Ohm
Lead Channel Impedance Value: 361 Ohm
Lead Channel Impedance Value: 380 Ohm
Lead Channel Pacing Threshold Amplitude: 0.625 V
Lead Channel Pacing Threshold Amplitude: 0.75 V
Lead Channel Pacing Threshold Pulse Width: 0.4 ms
Lead Channel Pacing Threshold Pulse Width: 0.4 ms
Lead Channel Sensing Intrinsic Amplitude: 2.125 mV
Lead Channel Sensing Intrinsic Amplitude: 2.125 mV
Lead Channel Sensing Intrinsic Amplitude: 3.875 mV
Lead Channel Sensing Intrinsic Amplitude: 3.875 mV
Lead Channel Setting Pacing Amplitude: 2 V
Lead Channel Setting Pacing Amplitude: 2.5 V
Lead Channel Setting Pacing Pulse Width: 0.4 ms
Lead Channel Setting Sensing Sensitivity: 0.9 mV
Zone Setting Status: 755011
Zone Setting Status: 755011

## 2022-11-02 NOTE — Progress Notes (Signed)
Remote pacemaker transmission.   

## 2022-11-14 ENCOUNTER — Ambulatory Visit: Payer: Self-pay

## 2022-11-14 NOTE — Patient Outreach (Signed)
  Care Coordination   Follow Up Visit Note   11/14/2022 Name: [name] MRN: [id number] DOB: 1941-02-24  [name] is a 82 y.o. year old male who sees Georgianne Fick, MD for primary care. I spoke with  [name] by phone today.  What matters to the patients health and wellness today?  Maintaining health    Goals Addressed             This Visit's Progress    Hypertension Management       Patient Goals/Self Care Activities: -Patient/Caregiver will self-administer medications as prescribed as evidenced by self-report/primary caregiver report  -Patient/Caregiver will attend all scheduled provider appointments as evidenced by clinician review of documented attendance to scheduled appointments and patient/caregiver report -Patient/Caregiver will call provider office for new concerns or questions as evidenced by review of documented incoming telephone call notes and patient report  -Calls provider office for new concerns, questions, or BP outside discussed parameters -Checks BP and records as discussed -Follows a low sodium diet/DASH diet   Patient reports doing good.  Blood pressure last check was 159/89 before medications.  Advised to check pressure 1-2 hours after medications.  Reiterated HTN management and diet.  No concerns.          SDOH assessments and interventions completed:  Yes     Care Coordination Interventions:  Yes, provided   Follow up plan: Follow up call scheduled for December    Encounter Outcome:  Patient Visit Completed   Jared Leriche, RN, MSN [place]  [facility], [facility] Management Community Coordinator Direct Dial: [phone]  Fax: [phone] Website: [email or web address]

## 2022-11-14 NOTE — Patient Instructions (Signed)
Visit Information  Thank you for taking time to visit with me today. Please don't hesitate to contact me if I can be of assistance to you.   Following are the goals we discussed today:   Goals Addressed             This Visit's Progress    Hypertension Management       Patient Goals/Self Care Activities: -Patient/Caregiver will self-administer medications as prescribed as evidenced by self-report/primary caregiver report  -Patient/Caregiver will attend all scheduled provider appointments as evidenced by clinician review of documented attendance to scheduled appointments and patient/caregiver report -Patient/Caregiver will call provider office for new concerns or questions as evidenced by review of documented incoming telephone call notes and patient report  -Calls provider office for new concerns, questions, or BP outside discussed parameters -Checks BP and records as discussed -Follows a low sodium diet/DASH diet   Patient reports doing good.  Blood pressure last check was 159/89 before medications.  Advised to check pressure 1-2 hours after medications.  Reiterated HTN management and diet.  No concerns.          Our next appointment is by telephone on 02/13/23 at 1030 am  Please call the care guide team at [phone] if you need to cancel or reschedule your appointment.   If you are experiencing a Mental Health or Behavioral Health Crisis or need someone to talk to, please call the Suicide and Crisis Lifeline: 988   The patient verbalized understanding of instructions, educational materials, and care plan provided today and DECLINED offer to receive copy of patient instructions, educational materials, and care plan.   The patient has been provided with contact information for the care management team and has been advised to call with any health related questions or concerns.   Bary Leriche, RN, MSN [facility], [facility]  Management Community Coordinator Direct Dial: [phone]  Fax: [phone] Website: [email or web address]

## 2022-11-21 DIAGNOSIS — M792 Neuralgia and neuritis, unspecified: Secondary | ICD-10-CM | POA: Diagnosis not present

## 2022-11-21 DIAGNOSIS — I739 Peripheral vascular disease, unspecified: Secondary | ICD-10-CM | POA: Diagnosis not present

## 2022-11-21 DIAGNOSIS — L851 Acquired keratosis [keratoderma] palmaris et plantaris: Secondary | ICD-10-CM | POA: Diagnosis not present

## 2022-11-21 DIAGNOSIS — B351 Tinea unguium: Secondary | ICD-10-CM | POA: Diagnosis not present

## 2022-11-21 DIAGNOSIS — E1142 Type 2 diabetes mellitus with diabetic polyneuropathy: Secondary | ICD-10-CM | POA: Diagnosis not present

## 2022-11-21 DIAGNOSIS — L309 Dermatitis, unspecified: Secondary | ICD-10-CM | POA: Diagnosis not present

## 2022-11-24 DIAGNOSIS — I739 Peripheral vascular disease, unspecified: Secondary | ICD-10-CM | POA: Diagnosis not present

## 2022-11-30 DIAGNOSIS — B351 Tinea unguium: Secondary | ICD-10-CM | POA: Diagnosis not present

## 2023-01-11 DIAGNOSIS — I10 Essential (primary) hypertension: Secondary | ICD-10-CM | POA: Diagnosis not present

## 2023-01-11 DIAGNOSIS — N1831 Chronic kidney disease, stage 3a: Secondary | ICD-10-CM | POA: Diagnosis not present

## 2023-01-11 DIAGNOSIS — R7303 Prediabetes: Secondary | ICD-10-CM | POA: Diagnosis not present

## 2023-01-11 DIAGNOSIS — E782 Mixed hyperlipidemia: Secondary | ICD-10-CM | POA: Diagnosis not present

## 2023-01-18 DIAGNOSIS — N1831 Chronic kidney disease, stage 3a: Secondary | ICD-10-CM | POA: Diagnosis not present

## 2023-01-18 DIAGNOSIS — R7303 Prediabetes: Secondary | ICD-10-CM | POA: Diagnosis not present

## 2023-01-18 DIAGNOSIS — I872 Venous insufficiency (chronic) (peripheral): Secondary | ICD-10-CM | POA: Diagnosis not present

## 2023-01-18 DIAGNOSIS — I739 Peripheral vascular disease, unspecified: Secondary | ICD-10-CM | POA: Diagnosis not present

## 2023-01-18 DIAGNOSIS — I10 Essential (primary) hypertension: Secondary | ICD-10-CM | POA: Diagnosis not present

## 2023-01-18 DIAGNOSIS — E782 Mixed hyperlipidemia: Secondary | ICD-10-CM | POA: Diagnosis not present

## 2023-01-18 DIAGNOSIS — M159 Polyosteoarthritis, unspecified: Secondary | ICD-10-CM | POA: Diagnosis not present

## 2023-01-18 DIAGNOSIS — I700 Atherosclerosis of aorta: Secondary | ICD-10-CM | POA: Diagnosis not present

## 2023-01-18 DIAGNOSIS — I495 Sick sinus syndrome: Secondary | ICD-10-CM | POA: Diagnosis not present

## 2023-01-18 DIAGNOSIS — M1000 Idiopathic gout, unspecified site: Secondary | ICD-10-CM | POA: Diagnosis not present

## 2023-01-19 ENCOUNTER — Ambulatory Visit (INDEPENDENT_AMBULATORY_CARE_PROVIDER_SITE_OTHER): Payer: Medicare Other

## 2023-01-19 DIAGNOSIS — I495 Sick sinus syndrome: Secondary | ICD-10-CM | POA: Diagnosis not present

## 2023-01-20 LAB — CUP PACEART REMOTE DEVICE CHECK
Battery Remaining Longevity: 60 mo
Battery Voltage: 2.96 V
Brady Statistic AP VP Percent: 86.65 %
Brady Statistic AP VS Percent: 0.31 %
Brady Statistic AS VP Percent: 7.54 %
Brady Statistic AS VS Percent: 5.51 %
Brady Statistic RA Percent Paced: 87.39 %
Brady Statistic RV Percent Paced: 94.18 %
Date Time Interrogation Session: 20241114011320
Implantable Lead Connection Status: 753985
Implantable Lead Connection Status: 753985
Implantable Lead Implant Date: 20190206
Implantable Lead Implant Date: 20190206
Implantable Lead Location: 753859
Implantable Lead Location: 753860
Implantable Lead Model: 5076
Implantable Lead Model: 5076
Implantable Pulse Generator Implant Date: 20190206
Lead Channel Impedance Value: 304 Ohm
Lead Channel Impedance Value: 342 Ohm
Lead Channel Impedance Value: 380 Ohm
Lead Channel Impedance Value: 399 Ohm
Lead Channel Pacing Threshold Amplitude: 0.5 V
Lead Channel Pacing Threshold Amplitude: 0.75 V
Lead Channel Pacing Threshold Pulse Width: 0.4 ms
Lead Channel Pacing Threshold Pulse Width: 0.4 ms
Lead Channel Sensing Intrinsic Amplitude: 2.375 mV
Lead Channel Sensing Intrinsic Amplitude: 2.375 mV
Lead Channel Sensing Intrinsic Amplitude: 5.125 mV
Lead Channel Sensing Intrinsic Amplitude: 5.125 mV
Lead Channel Setting Pacing Amplitude: 2 V
Lead Channel Setting Pacing Amplitude: 2.5 V
Lead Channel Setting Pacing Pulse Width: 0.4 ms
Lead Channel Setting Sensing Sensitivity: 0.9 mV
Zone Setting Status: 755011
Zone Setting Status: 755011

## 2023-01-23 DIAGNOSIS — N200 Calculus of kidney: Secondary | ICD-10-CM | POA: Diagnosis not present

## 2023-01-27 DIAGNOSIS — M545 Low back pain, unspecified: Secondary | ICD-10-CM | POA: Diagnosis not present

## 2023-02-06 NOTE — Progress Notes (Signed)
Remote pacemaker transmission.   

## 2023-02-10 DIAGNOSIS — L851 Acquired keratosis [keratoderma] palmaris et plantaris: Secondary | ICD-10-CM | POA: Diagnosis not present

## 2023-02-10 DIAGNOSIS — E1142 Type 2 diabetes mellitus with diabetic polyneuropathy: Secondary | ICD-10-CM | POA: Diagnosis not present

## 2023-02-10 DIAGNOSIS — M792 Neuralgia and neuritis, unspecified: Secondary | ICD-10-CM | POA: Diagnosis not present

## 2023-02-10 DIAGNOSIS — I70203 Unspecified atherosclerosis of native arteries of extremities, bilateral legs: Secondary | ICD-10-CM | POA: Diagnosis not present

## 2023-02-10 DIAGNOSIS — I739 Peripheral vascular disease, unspecified: Secondary | ICD-10-CM | POA: Diagnosis not present

## 2023-02-10 DIAGNOSIS — L309 Dermatitis, unspecified: Secondary | ICD-10-CM | POA: Diagnosis not present

## 2023-02-13 ENCOUNTER — Ambulatory Visit: Payer: Self-pay

## 2023-02-13 NOTE — Patient Outreach (Signed)
  Care Coordination   Follow Up Visit Note   02/13/2023 Name: SHAHZAIB PRICHARD MRN: [id number] DOB: [birth year]  AROLDO ROKUSEK is a 82 y.o. year old male who sees Georgianne Fick, MD for primary care. I spoke with  Maceo Pro by phone today.  What matters to the patients health and wellness today?  New back apin    Goals Addressed             This Visit's Progress    Hypertension Management       Patient Goals/Self Care Activities: -Patient/Caregiver will self-administer medications as prescribed as evidenced by self-report/primary caregiver report  -Patient/Caregiver will attend all scheduled provider appointments as evidenced by clinician review of documented attendance to scheduled appointments and patient/caregiver report -Patient/Caregiver will call provider office for new concerns or questions as evidenced by review of documented incoming telephone call notes and patient report  -Calls provider office for new concerns, questions, or BP outside discussed parameters -Checks BP and records as discussed -Follows a low sodium diet/DASH diet   Patient reports okay but dealing with some lower back pain.  He reports he went to his urologist to make sure it was not his prostate or kidney stones. He reports nothing was found so he will be seeing the orthopedic this week.  He reports it is some better but still hurts.  Discussed he may have injured it unknowingly.  Also discussed pain management.  He reports his blood pressure is good with last check 127/60.  Encouraged continued blood pressure monitoring.  No RN CM concerns.          SDOH assessments and interventions completed:  Yes  SDOH Interventions Today    Flowsheet Row Most Recent Value  SDOH Interventions   Food Insecurity Interventions Intervention Not Indicated  Housing Interventions Intervention Not Indicated  Transportation Interventions Intervention Not Indicated  Utilities Interventions Intervention  Not Indicated  Health Literacy Interventions Intervention Not Indicated        Care Coordination Interventions:  Yes, provided   Follow up plan: Follow up call scheduled for March    Encounter Outcome:  Patient Visit Completed   Bary Leriche, RN, MSN RN Care Manager [facility], Population Health Direct Dial: [phone]  Fax: [phone] Website: [email or web address]

## 2023-02-13 NOTE — Patient Instructions (Signed)
Visit Information  Thank you for taking time to visit with me today. Please don't hesitate to contact me if I can be of assistance to you.   Following are the goals we discussed today:   Goals Addressed             This Visit's Progress    Hypertension Management       Patient Goals/Self Care Activities: -Patient/Caregiver will self-administer medications as prescribed as evidenced by self-report/primary caregiver report  -Patient/Caregiver will attend all scheduled provider appointments as evidenced by clinician review of documented attendance to scheduled appointments and patient/caregiver report -Patient/Caregiver will call provider office for new concerns or questions as evidenced by review of documented incoming telephone call notes and patient report  -Calls provider office for new concerns, questions, or BP outside discussed parameters -Checks BP and records as discussed -Follows a low sodium diet/DASH diet   Patient reports okay but dealing with some lower back pain.  He reports he went to his urologist to make sure it was not his prostate or kidney stones. He reports nothing was found so he will be seeing the orthopedic this week.  He reports it is some better but still hurts.  Discussed he may have injured it unknowingly.  Also discussed pain management.  He reports his blood pressure is good with last check 127/60.  Encouraged continued blood pressure monitoring.  No RN CM concerns.          Our next appointment is by telephone on 05/15/23 at 1030  Please call the care guide team at [phone] if you need to cancel or reschedule your appointment.   If you are experiencing a Mental Health or Behavioral Health Crisis or need someone to talk to, please call the Suicide and Crisis Lifeline: 988   The patient verbalized understanding of instructions, educational materials, and care plan provided today and DECLINED offer to receive copy of patient instructions, educational  materials, and care plan.   The patient has been provided with contact information for the care management team and has been advised to call with any health related questions or concerns.   Bary Leriche, RN, MSN RN Care Manager [facility], Population Health Direct Dial: [phone]  Fax: [phone] Website: [email or web address]

## 2023-02-14 DIAGNOSIS — H01024 Squamous blepharitis left upper eyelid: Secondary | ICD-10-CM | POA: Diagnosis not present

## 2023-02-14 DIAGNOSIS — H02834 Dermatochalasis of left upper eyelid: Secondary | ICD-10-CM | POA: Diagnosis not present

## 2023-02-14 DIAGNOSIS — H00021 Hordeolum internum right upper eyelid: Secondary | ICD-10-CM | POA: Diagnosis not present

## 2023-02-14 DIAGNOSIS — H40053 Ocular hypertension, bilateral: Secondary | ICD-10-CM | POA: Diagnosis not present

## 2023-02-14 DIAGNOSIS — H02835 Dermatochalasis of left lower eyelid: Secondary | ICD-10-CM | POA: Diagnosis not present

## 2023-02-14 DIAGNOSIS — H01021 Squamous blepharitis right upper eyelid: Secondary | ICD-10-CM | POA: Diagnosis not present

## 2023-02-14 DIAGNOSIS — H02831 Dermatochalasis of right upper eyelid: Secondary | ICD-10-CM | POA: Diagnosis not present

## 2023-02-14 DIAGNOSIS — H02832 Dermatochalasis of right lower eyelid: Secondary | ICD-10-CM | POA: Diagnosis not present

## 2023-02-14 DIAGNOSIS — H40033 Anatomical narrow angle, bilateral: Secondary | ICD-10-CM | POA: Diagnosis not present

## 2023-02-14 DIAGNOSIS — Z961 Presence of intraocular lens: Secondary | ICD-10-CM | POA: Diagnosis not present

## 2023-02-20 DIAGNOSIS — M5451 Vertebrogenic low back pain: Secondary | ICD-10-CM | POA: Diagnosis not present

## 2023-02-21 DIAGNOSIS — H40033 Anatomical narrow angle, bilateral: Secondary | ICD-10-CM | POA: Diagnosis not present

## 2023-02-21 DIAGNOSIS — Z961 Presence of intraocular lens: Secondary | ICD-10-CM | POA: Diagnosis not present

## 2023-02-21 DIAGNOSIS — H01024 Squamous blepharitis left upper eyelid: Secondary | ICD-10-CM | POA: Diagnosis not present

## 2023-02-21 DIAGNOSIS — H02831 Dermatochalasis of right upper eyelid: Secondary | ICD-10-CM | POA: Diagnosis not present

## 2023-02-21 DIAGNOSIS — H02832 Dermatochalasis of right lower eyelid: Secondary | ICD-10-CM | POA: Diagnosis not present

## 2023-02-21 DIAGNOSIS — H00021 Hordeolum internum right upper eyelid: Secondary | ICD-10-CM | POA: Diagnosis not present

## 2023-02-21 DIAGNOSIS — H02835 Dermatochalasis of left lower eyelid: Secondary | ICD-10-CM | POA: Diagnosis not present

## 2023-02-21 DIAGNOSIS — H40053 Ocular hypertension, bilateral: Secondary | ICD-10-CM | POA: Diagnosis not present

## 2023-02-21 DIAGNOSIS — H02834 Dermatochalasis of left upper eyelid: Secondary | ICD-10-CM | POA: Diagnosis not present

## 2023-02-21 DIAGNOSIS — H01021 Squamous blepharitis right upper eyelid: Secondary | ICD-10-CM | POA: Diagnosis not present

## 2023-02-21 DIAGNOSIS — H02423 Myogenic ptosis of bilateral eyelids: Secondary | ICD-10-CM | POA: Diagnosis not present

## 2023-02-24 DIAGNOSIS — M545 Low back pain, unspecified: Secondary | ICD-10-CM | POA: Diagnosis not present

## 2023-04-12 DIAGNOSIS — I70203 Unspecified atherosclerosis of native arteries of extremities, bilateral legs: Secondary | ICD-10-CM | POA: Diagnosis not present

## 2023-04-12 DIAGNOSIS — M792 Neuralgia and neuritis, unspecified: Secondary | ICD-10-CM | POA: Diagnosis not present

## 2023-04-12 DIAGNOSIS — I739 Peripheral vascular disease, unspecified: Secondary | ICD-10-CM | POA: Diagnosis not present

## 2023-04-12 DIAGNOSIS — E1142 Type 2 diabetes mellitus with diabetic polyneuropathy: Secondary | ICD-10-CM | POA: Diagnosis not present

## 2023-04-12 DIAGNOSIS — L851 Acquired keratosis [keratoderma] palmaris et plantaris: Secondary | ICD-10-CM | POA: Diagnosis not present

## 2023-04-12 DIAGNOSIS — L309 Dermatitis, unspecified: Secondary | ICD-10-CM | POA: Diagnosis not present

## 2023-04-17 DIAGNOSIS — H00021 Hordeolum internum right upper eyelid: Secondary | ICD-10-CM | POA: Diagnosis not present

## 2023-04-20 ENCOUNTER — Ambulatory Visit (INDEPENDENT_AMBULATORY_CARE_PROVIDER_SITE_OTHER): Payer: Medicare Other

## 2023-04-20 DIAGNOSIS — I495 Sick sinus syndrome: Secondary | ICD-10-CM | POA: Diagnosis not present

## 2023-04-22 LAB — CUP PACEART REMOTE DEVICE CHECK
Battery Remaining Longevity: 57 mo
Battery Voltage: 2.96 V
Brady Statistic AP VP Percent: 78.4 %
Brady Statistic AP VS Percent: 0.49 %
Brady Statistic AS VP Percent: 11.96 %
Brady Statistic AS VS Percent: 9.15 %
Brady Statistic RA Percent Paced: 79.41 %
Brady Statistic RV Percent Paced: 90.36 %
Date Time Interrogation Session: 20250213011539
Implantable Lead Connection Status: 753985
Implantable Lead Connection Status: 753985
Implantable Lead Implant Date: 20190206
Implantable Lead Implant Date: 20190206
Implantable Lead Location: 753859
Implantable Lead Location: 753860
Implantable Lead Model: 5076
Implantable Lead Model: 5076
Implantable Pulse Generator Implant Date: 20190206
Lead Channel Impedance Value: 323 Ohm
Lead Channel Impedance Value: 361 Ohm
Lead Channel Impedance Value: 380 Ohm
Lead Channel Impedance Value: 418 Ohm
Lead Channel Pacing Threshold Amplitude: 0.5 V
Lead Channel Pacing Threshold Amplitude: 0.75 V
Lead Channel Pacing Threshold Pulse Width: 0.4 ms
Lead Channel Pacing Threshold Pulse Width: 0.4 ms
Lead Channel Sensing Intrinsic Amplitude: 2.375 mV
Lead Channel Sensing Intrinsic Amplitude: 2.375 mV
Lead Channel Sensing Intrinsic Amplitude: 6.375 mV
Lead Channel Sensing Intrinsic Amplitude: 6.375 mV
Lead Channel Setting Pacing Amplitude: 2 V
Lead Channel Setting Pacing Amplitude: 2.5 V
Lead Channel Setting Pacing Pulse Width: 0.4 ms
Lead Channel Setting Sensing Sensitivity: 0.9 mV
Zone Setting Status: 755011
Zone Setting Status: 755011

## 2023-04-24 DIAGNOSIS — N200 Calculus of kidney: Secondary | ICD-10-CM | POA: Diagnosis not present

## 2023-04-25 DIAGNOSIS — E1142 Type 2 diabetes mellitus with diabetic polyneuropathy: Secondary | ICD-10-CM | POA: Diagnosis not present

## 2023-04-25 DIAGNOSIS — M216X1 Other acquired deformities of right foot: Secondary | ICD-10-CM | POA: Diagnosis not present

## 2023-04-25 DIAGNOSIS — M216X2 Other acquired deformities of left foot: Secondary | ICD-10-CM | POA: Diagnosis not present

## 2023-04-25 DIAGNOSIS — L309 Dermatitis, unspecified: Secondary | ICD-10-CM | POA: Diagnosis not present

## 2023-04-25 DIAGNOSIS — I739 Peripheral vascular disease, unspecified: Secondary | ICD-10-CM | POA: Diagnosis not present

## 2023-04-25 DIAGNOSIS — M792 Neuralgia and neuritis, unspecified: Secondary | ICD-10-CM | POA: Diagnosis not present

## 2023-04-25 DIAGNOSIS — I70203 Unspecified atherosclerosis of native arteries of extremities, bilateral legs: Secondary | ICD-10-CM | POA: Diagnosis not present

## 2023-04-25 DIAGNOSIS — L851 Acquired keratosis [keratoderma] palmaris et plantaris: Secondary | ICD-10-CM | POA: Diagnosis not present

## 2023-04-25 DIAGNOSIS — L97521 Non-pressure chronic ulcer of other part of left foot limited to breakdown of skin: Secondary | ICD-10-CM | POA: Diagnosis not present

## 2023-05-02 DIAGNOSIS — L97521 Non-pressure chronic ulcer of other part of left foot limited to breakdown of skin: Secondary | ICD-10-CM | POA: Diagnosis not present

## 2023-05-02 DIAGNOSIS — I739 Peripheral vascular disease, unspecified: Secondary | ICD-10-CM | POA: Diagnosis not present

## 2023-05-02 DIAGNOSIS — E1142 Type 2 diabetes mellitus with diabetic polyneuropathy: Secondary | ICD-10-CM | POA: Diagnosis not present

## 2023-05-02 DIAGNOSIS — I70203 Unspecified atherosclerosis of native arteries of extremities, bilateral legs: Secondary | ICD-10-CM | POA: Diagnosis not present

## 2023-05-02 DIAGNOSIS — M216X1 Other acquired deformities of right foot: Secondary | ICD-10-CM | POA: Diagnosis not present

## 2023-05-02 DIAGNOSIS — M216X2 Other acquired deformities of left foot: Secondary | ICD-10-CM | POA: Diagnosis not present

## 2023-05-03 DIAGNOSIS — I739 Peripheral vascular disease, unspecified: Secondary | ICD-10-CM | POA: Diagnosis not present

## 2023-05-03 DIAGNOSIS — N1831 Chronic kidney disease, stage 3a: Secondary | ICD-10-CM | POA: Diagnosis not present

## 2023-05-03 DIAGNOSIS — M159 Polyosteoarthritis, unspecified: Secondary | ICD-10-CM | POA: Diagnosis not present

## 2023-05-03 DIAGNOSIS — M1000 Idiopathic gout, unspecified site: Secondary | ICD-10-CM | POA: Diagnosis not present

## 2023-05-03 DIAGNOSIS — E782 Mixed hyperlipidemia: Secondary | ICD-10-CM | POA: Diagnosis not present

## 2023-05-03 DIAGNOSIS — R7303 Prediabetes: Secondary | ICD-10-CM | POA: Diagnosis not present

## 2023-05-03 DIAGNOSIS — I951 Orthostatic hypotension: Secondary | ICD-10-CM | POA: Diagnosis not present

## 2023-05-03 DIAGNOSIS — I10 Essential (primary) hypertension: Secondary | ICD-10-CM | POA: Diagnosis not present

## 2023-05-03 DIAGNOSIS — I495 Sick sinus syndrome: Secondary | ICD-10-CM | POA: Diagnosis not present

## 2023-05-03 DIAGNOSIS — I872 Venous insufficiency (chronic) (peripheral): Secondary | ICD-10-CM | POA: Diagnosis not present

## 2023-05-08 DIAGNOSIS — M1611 Unilateral primary osteoarthritis, right hip: Secondary | ICD-10-CM | POA: Diagnosis not present

## 2023-05-08 DIAGNOSIS — M5451 Vertebrogenic low back pain: Secondary | ICD-10-CM | POA: Diagnosis not present

## 2023-05-08 DIAGNOSIS — M25551 Pain in right hip: Secondary | ICD-10-CM | POA: Diagnosis not present

## 2023-05-10 DIAGNOSIS — H905 Unspecified sensorineural hearing loss: Secondary | ICD-10-CM | POA: Diagnosis not present

## 2023-05-15 ENCOUNTER — Ambulatory Visit: Payer: Self-pay

## 2023-05-15 NOTE — Patient Outreach (Signed)
 Care Coordination   Follow Up Visit Note   05/15/2023 Name: OSMANY AZER MRN: [id number] DOB: 08-27-1940  HAYDYN GIRVAN is a 83 y.o. year old male who sees Georgianne Fick, MD for primary care. I spoke with  Maceo Pro by phone today.  What matters to the patients health and wellness today?  Maintaining health    Goals Addressed             This Visit's Progress    COMPLETED: Hypertension Management       Patient Goals/Self Care Activities: -Patient/Caregiver will self-administer medications as prescribed as evidenced by self-report/primary caregiver report  -Patient/Caregiver will attend all scheduled provider appointments as evidenced by clinician review of documented attendance to scheduled appointments and patient/caregiver report -Patient/Caregiver will call provider office for new concerns or questions as evidenced by review of documented incoming telephone call notes and patient report  -Calls provider office for new concerns, questions, or BP outside discussed parameters -Checks BP and records as discussed -Follows a low sodium diet/DASH diet   Patient reports good.  He reports that his blood pressure has been doing good.  Discussed continued HTN Management.  Discussed with patient health self management and how he is managing well.  Advised CM will close case at this time.  He verbalized understanding and is in agreement.           SDOH assessments and interventions completed:  Yes     Care Coordination Interventions:  Yes, provided   Follow up plan: No further intervention required.   Encounter Outcome:  Patient Visit Completed   Bary Leriche RN, MSN [facility], [facility] Health RN Care Manager Direct Dial: [phone]  Fax: [phone] Website: [email or web address]

## 2023-05-15 NOTE — Patient Instructions (Signed)
 Visit Information  Thank you for taking time to visit with me today. Please don't hesitate to contact me if I can be of assistance to you.   Following are the goals we discussed today:   Goals Addressed             This Visit's Progress    COMPLETED: Hypertension Management       Patient Goals/Self Care Activities: -Patient/Caregiver will self-administer medications as prescribed as evidenced by self-report/primary caregiver report  -Patient/Caregiver will attend all scheduled provider appointments as evidenced by clinician review of documented attendance to scheduled appointments and patient/caregiver report -Patient/Caregiver will call provider office for new concerns or questions as evidenced by review of documented incoming telephone call notes and patient report  -Calls provider office for new concerns, questions, or BP outside discussed parameters -Checks BP and records as discussed -Follows a low sodium diet/DASH diet   Patient reports good.  He reports that his blood pressure has been doing good.  Discussed continued HTN Management.  Discussed with patient health self management and how he is managing well.  Advised CM will close case at this time.  He verbalized understanding and is in agreement.            If you are experiencing a Mental Health or Behavioral Health Crisis or need someone to talk to, please call the Suicide and Crisis Lifeline: 988   The patient verbalized understanding of instructions, educational materials, and care plan provided today and DECLINED offer to receive copy of patient instructions, educational materials, and care plan.   The patient has been provided with contact information for the care management team and has been advised to call with any health related questions or concerns.   Bary Leriche RN, MSN [facility], [facility] Health RN Care Manager Direct Dial: [phone]  Fax: [phone] Website:  [email or web address]

## 2023-05-16 DIAGNOSIS — I70203 Unspecified atherosclerosis of native arteries of extremities, bilateral legs: Secondary | ICD-10-CM | POA: Diagnosis not present

## 2023-05-16 DIAGNOSIS — M216X2 Other acquired deformities of left foot: Secondary | ICD-10-CM | POA: Diagnosis not present

## 2023-05-16 DIAGNOSIS — L97521 Non-pressure chronic ulcer of other part of left foot limited to breakdown of skin: Secondary | ICD-10-CM | POA: Diagnosis not present

## 2023-05-16 DIAGNOSIS — M216X1 Other acquired deformities of right foot: Secondary | ICD-10-CM | POA: Diagnosis not present

## 2023-05-16 DIAGNOSIS — I739 Peripheral vascular disease, unspecified: Secondary | ICD-10-CM | POA: Diagnosis not present

## 2023-05-16 DIAGNOSIS — E1142 Type 2 diabetes mellitus with diabetic polyneuropathy: Secondary | ICD-10-CM | POA: Diagnosis not present

## 2023-05-18 ENCOUNTER — Ambulatory Visit: Payer: Medicare Other | Attending: Cardiovascular Disease | Admitting: Cardiovascular Disease

## 2023-05-18 ENCOUNTER — Encounter: Payer: Self-pay | Admitting: Cardiovascular Disease

## 2023-05-18 VITALS — BP 128/64 | HR 65 | Ht 67.0 in | Wt 208.2 lb

## 2023-05-18 DIAGNOSIS — G4733 Obstructive sleep apnea (adult) (pediatric): Secondary | ICD-10-CM | POA: Diagnosis not present

## 2023-05-18 DIAGNOSIS — Z79899 Other long term (current) drug therapy: Secondary | ICD-10-CM | POA: Diagnosis not present

## 2023-05-18 DIAGNOSIS — I10 Essential (primary) hypertension: Secondary | ICD-10-CM | POA: Diagnosis not present

## 2023-05-18 DIAGNOSIS — I4719 Other supraventricular tachycardia: Secondary | ICD-10-CM | POA: Diagnosis not present

## 2023-05-18 DIAGNOSIS — Z5181 Encounter for therapeutic drug level monitoring: Secondary | ICD-10-CM

## 2023-05-18 DIAGNOSIS — R7303 Prediabetes: Secondary | ICD-10-CM | POA: Diagnosis not present

## 2023-05-18 DIAGNOSIS — Z950 Presence of cardiac pacemaker: Secondary | ICD-10-CM

## 2023-05-18 DIAGNOSIS — I441 Atrioventricular block, second degree: Secondary | ICD-10-CM

## 2023-05-18 DIAGNOSIS — E7800 Pure hypercholesterolemia, unspecified: Secondary | ICD-10-CM

## 2023-05-18 NOTE — Patient Instructions (Signed)
 Medication Instructions:  No changes *If you need a refill on your cardiac medications before your next appointment, please call your pharmacy*   Lab Work: No Labs If you have labs (blood work) drawn today and your tests are completely normal, you will receive your results only by: MyChart Message (if you have MyChart) OR A paper copy in the mail If you have any lab test that is abnormal or we need to change your treatment, we will call you to review the results.   Testing/Procedures: No Testing   Follow-Up: At [facility], you and your health needs are our priority.  As part of our continuing mission to provide you with exceptional heart care, we have created designated Provider Care Teams.  These Care Teams include your primary Cardiologist (physician) and Advanced Practice Providers (APPs -  Physician Assistants and Nurse Practitioners) who all work together to provide you with the care you need, when you need it.  We recommend signing up for the patient portal called "MyChart".  Sign up information is provided on this After Visit Summary.  MyChart is used to connect with patients for Virtual Visits (Telemedicine).  Patients are able to view lab/test results, encounter notes, upcoming appointments, etc.  Non-urgent messages can be sent to your provider as well.   To learn more about what you can do with MyChart, go to [email or web address].    Your next appointment:   1 year(s)  Provider:   Thurmon Fair, MD   Other Instructions   1st Floor: - Lobby - Registration  - Pharmacy  - Lab - Cafe  2nd Floor: - PV Lab - Diagnostic Testing (echo, CT, nuclear med)  3rd Floor: - Vacant  4th Floor: - TCTS (cardiothoracic surgery) - AFib Clinic - Structural Heart Clinic - Vascular Surgery  - Vascular Ultrasound  5th Floor: - HeartCare Cardiology (general and EP) - Clinical Pharmacy for coumadin, hypertension, lipid, weight-loss medications, and med management  appointments    Valet parking services will be available as well.

## 2023-05-18 NOTE — Progress Notes (Signed)
 Cardiology office note   Date:  05/18/2023   ID:  [name], [name] Apr 10, 1940, MRN [id number]   PCP:  Jared Fick, MD  Cardiologist:  Jared Fair, MD  Electrophysiologist:  Jared Prude, MD   Evaluation Performed:  Follow-Up Visit  Chief Complaint: Pacemaker follow-up  History of Present Illness:    [name] is a 83 y.o. male with sick sinus syndrome and tachycardia-bradycardia syndrome, paroxysmal atrial tachycardia (possible AVNRT), second-degree atrioventricular block with bradycardia and near syncope for which he received a pacemaker in February 2019.  Additional problems include mild obesity, hyperlipidemia, essential hypertension, obstructive sleep apnea, gout.  Slow pathway radiofrequency ablation was attempted 02/14/2020 by Dr. Lalla [name], but abandoned due to the high risk of complete heart block.  He was started on amiodarone at that time.  He continues to do well and is quite active for his age.  His pacemaker shows 6-7 hours of activity a day.  He has not had any new cardiovascular problems and has not been troubled by palpitations.  Has occasional mild left ankle swelling (has chronic organized clot in one of the popliteal veins from a remote DVT), but this resolves if he keeps his leg elevated.  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  Denies angina or dyspnea at rest or with activity.  Does not have orthostatic hypotension or claudication.  Has not had any recent gout attacks.  Pacemaker interrogation shows normal device function.  He has 84% atrial pacing and 92% ventricular pacing.  The heart rate histogram looks much more appropriate after the adjustments to his sensor at his last visit.  Estimated generator longevity is about 5 years and all lead parameters are excellent.  Presenting rhythm is AV sequential pacing.  He has not had any episodes of atrial mode switch or ventricular tachycardia.  His pacemaker system is MRI  conditional (Azure XT DR MRI and 5076 leads).  Metabolic control is good.  Most recent labs from 01/11/2023 showed LDL 78, triglycerides 48, HDL 81, hemoglobin A1c 6.0%.  He has chronic kidney disease and the most recent creatinine was at baseline of 1.62.  Electrolytes are normal.   Past Medical History:  Diagnosis Date   Arthritis    "probably; hands" (04/12/2017)   High cholesterol    History of gout    Hypertension    OSA (obstructive sleep apnea)    per pt noncompliant CPAP has not use it since 2015   Presence of permanent cardiac pacemaker 04/12/2017   Dual Chamber   Wears dentures    upper   Past Surgical History:  Procedure Laterality Date   CHOLECYSTECTOMY N/A 08/19/2017   Procedure: LAPAROSCOPIC ASSISTED OPEN CHOLECYSTECTOMY WITH INTRAOPERATIVE CHOLANGIOGRAM;  Surgeon: Jared Level, MD;  Location: [facility];  Service: General;  Laterality: N/A;   ELBOW BURSA SURGERY Left 07-21-2009   extensive bursectomy/ tenosynovectomy/ ulnar nerve release   EUS N/A 08/18/2017   Procedure: ESOPHAGEAL ENDOSCOPIC ULTRASOUND (EUS) RADIAL;  Surgeon: Jared Fee, MD;  Location: [facility];  Service: Endoscopy;  Laterality: N/A;   HAMMER TOE SURGERY Right 2015   HAMMER TOE SURGERY Right 12/11/2014   Procedure: 4th HAMMER TOE CORRECTION, EXCISION LESION 3rd TOE RIGHT FOOT;  Surgeon: [name], DPM;  Location: [facility];  Service: Podiatry;  Laterality: Right;   INSERT / REPLACE / REMOVE PACEMAKER  04/12/2017   Dual Chamber   MICROLARYNGOSCOPY  06-07-2006   w/  Excision bilateral vocal cord lesions (  bilateral benign nodules)   PACEMAKER IMPLANT N/A 04/12/2017   Procedure: PACEMAKER IMPLANT - Dual Chamber;  Surgeon: Jared Fair, MD;  Location: [facility];  Service: Cardiovascular;  Laterality: N/A;   SVT ABLATION N/A 02/14/2020   Procedure: SVT ABLATION;  Surgeon: Jared Prude, MD;  Location: [facility] INVASIVE CV LAB;  Service: Cardiovascular;  Laterality: N/A;      Current Meds  Medication Sig   amiodarone (PACERONE) 200 MG tablet TAKE 1 TABLET BY MOUTH EVERY DAY   aspirin EC 81 MG tablet Take 81 mg by mouth daily.   atorvastatin (LIPITOR) 40 MG tablet Take 1 tablet by mouth daily.   fluticasone (FLONASE) 50 MCG/ACT nasal spray Place 1 spray into both nostrils daily as needed for rhinitis.    losartan (COZAAR) 50 MG tablet TAKE 1 TABLET BY MOUTH EVERY DAY   Multiple Vitamin (MULTIVITAMIN WITH MINERALS) TABS tablet Take 1 tablet by mouth daily.   tamsulosin (FLOMAX) 0.4 MG CAPS capsule Take 0.4 mg by mouth daily.   triamcinolone cream (KENALOG) 0.5 % Apply 1 application topically in the morning and at bedtime.     Allergies:   Patient has no known allergies.   Social History   Tobacco Use   Smoking status: Never   Smokeless tobacco: Never  Vaping Use   Vaping status: Never Used  Substance Use Topics   Alcohol use: Yes    Alcohol/week: 2.0 standard drinks of alcohol    Types: 2 Standard drinks or equivalent per week   Drug use: No     Family Hx: The patient's family history includes Heart attack in his sister; Heart attack (age of onset: 68) in his father; Osteoarthritis in his father; Stroke (age of onset: 24) in his mother. There is no history of Colon cancer.  ROS:   Please see the history of present illness.   All other systems are reviewed and are negative.   Prior CV studies:   The following studies were reviewed today:  Comprehensive pacemaker check performed today.  See results above.  Labs/Other Tests and Data Reviewed:    EKG:   EKG Interpretation Date/Time:  Thursday May 18 2023 08:25:07 EDT Ventricular Rate:  65 PR Interval:  346 QRS Duration:  202 QT Interval:  512 QTC Calculation: 532 R Axis:   -77  Text Interpretation: AV dual-paced rhythm with prolonged AV conduction When compared with ECG of 14-Feb-2020 12:22, Vent. rate has decreased BY   5 BPM Confirmed by Eagan Shifflett ([id number]) on 05/18/2023 8:32:49  AM         Recent Labs: No results found for requested labs within last 365 days.  01/11/2023 hemoglobin A1c 6%, creatinine 1.62, potassium 4.5, all liver function tests normal   Recent Lipid Panel 01/11/2023  Cholesterol 163, triglycerides 48, HDL 75, LDL 78   Wt Readings from Last 3 Encounters:  05/18/23 208 lb 3.2 oz (94.4 kg)  05/09/22 211 lb 6.4 oz (95.9 kg)  05/26/21 230 lb (104.3 kg)     Objective:    Vital Signs:  BP 128/64 (BP Location: Left Arm, Patient Position: Sitting, Cuff Size: Normal)   Pulse 65   Ht 5\' 7"  (1.702 m)   Wt 208 lb 3.2 oz (94.4 kg)   BMI 32.61 kg/m      General: Alert, oriented x3, no distress, mildly obese, but also appears quite fit for his age.  Healthy left subclavian pacemaker site. Head: no evidence of trauma, PERRL, EOMI, no exophtalmos or  lid lag, no myxedema, no xanthelasma; normal ears, nose and oropharynx Neck: normal jugular venous pulsations and no hepatojugular reflux; brisk carotid pulses without delay and no carotid bruits Chest: clear to auscultation, no signs of consolidation by percussion or palpation, normal fremitus, symmetrical and full respiratory excursions Cardiovascular: normal position and quality of the apical impulse, regular rhythm, normal first and second heart sounds, no murmurs, rubs or gallops Abdomen: no tenderness or distention, no masses by palpation, no abnormal pulsatility or arterial bruits, normal bowel sounds, no hepatosplenomegaly Extremities: no clubbing, cyanosis or edema; 2+ radial, ulnar and brachial pulses bilaterally; 2+ right femoral, posterior tibial and dorsalis pedis pulses; 2+ left femoral, posterior tibial and dorsalis pedis pulses; no subclavian or femoral bruits Neurological: grossly nonfocal Psych: Normal mood and affect    ASSESSMENT & PLAN:    1. AVNRT (AV nodal re-entry tachycardia) (HCC)   2. Essential hypertension   3. Second degree atrioventricular block, Mobitz (type) I   4.  Pacemaker   5. Encounter for monitoring amiodarone therapy   6. OSA (obstructive sleep apnea)   7. Hypercholesteremia   8. Prediabetes      AVNRT: Denies palpitations and no episodes have been recorded by his pacemaker since May 2023.  There has been significant prolongation of AV conduction time after he started the amiodarone.  Although EP study demonstrated dual pathway AV node physiology, ablation could not be successfully performed.  He has been asymptomatic and arrhythmia free on amiodarone. 2nd deg AVB, MT1: Although he is not actually pacemaker dependent he has a very high prevalence of ventricular pacing due to conduction abnormalities, especially after starting amiodarone. PM: Better heart rate histogram and a little less ventricular pacing with the changes made at last appointment.  I think eventually we may just switch his device to "physiological" AV delays and committed ventricular pacing.  No additional changes were made today. Amiodarone: As talked this well without side effects.  Needs periodic liver function tests and TSH checked, these have been checked within the last 6 months and were normal range. HTN: Well-controlled.  Continue losartan. OSA: He reports good compliance with CPAP and denies daytime hypersomnolence. HLP: All lipid parameters in target range on labs from November, monitored by Dr. Nicholos Johns.  Continue atorvastatin. Prediabetes: Hemoglobin A1c 6.0%, advised weight loss.  Avoid sugars and starches with high glycemic index.  He is quite active physically.  Patient Instructions  Medication Instructions:  No changes *If you need a refill on your cardiac medications before your next appointment, please call your pharmacy*   Lab Work: No Labs If you have labs (blood work) drawn today and your tests are completely normal, you will receive your results only by: MyChart Message (if you have MyChart) OR A paper copy in the mail If you have any lab test that is  abnormal or we need to change your treatment, we will call you to review the results.   Testing/Procedures: No Testing   Follow-Up: At [facility], you and your health needs are our priority.  As part of our continuing mission to provide you with exceptional heart care, we have created designated Provider Care Teams.  These Care Teams include your primary Cardiologist (physician) and Advanced Practice Providers (APPs -  Physician Assistants and Nurse Practitioners) who all work together to provide you with the care you need, when you need it.  We recommend signing up for the patient portal called "MyChart".  Sign up information is provided on this After  Visit Summary.  MyChart is used to connect with patients for Virtual Visits (Telemedicine).  Patients are able to view lab/test results, encounter notes, upcoming appointments, etc.  Non-urgent messages can be sent to your provider as well.   To learn more about what you can do with MyChart, go to [email or web address].    Your next appointment:   1 year(s)  Provider:   Thurmon Fair, MD    Other Instructions   1st Floor: - Lobby - Registration  - Pharmacy  - Lab - Cafe  2nd Floor: - PV Lab - Diagnostic Testing (echo, CT, nuclear med)  3rd Floor: - Vacant  4th Floor: - TCTS (cardiothoracic surgery) - AFib Clinic - Structural Heart Clinic - Vascular Surgery  - Vascular Ultrasound  5th Floor: - HeartCare Cardiology (general and EP) - Clinical Pharmacy for coumadin, hypertension, lipid, weight-loss medications, and med management appointments    Valet parking services will be available as well.          Signed, Jared Fair, MD  05/18/2023 9:07 AM    [facility]

## 2023-05-23 NOTE — Progress Notes (Signed)
 Remote pacemaker transmission.

## 2023-05-23 NOTE — Addendum Note (Signed)
 Addended by: Elease Etienne A on: 05/23/2023 02:32 PM   Modules accepted: Orders

## 2023-05-30 DIAGNOSIS — I951 Orthostatic hypotension: Secondary | ICD-10-CM | POA: Diagnosis not present

## 2023-07-12 DIAGNOSIS — I872 Venous insufficiency (chronic) (peripheral): Secondary | ICD-10-CM | POA: Diagnosis not present

## 2023-07-12 DIAGNOSIS — R7303 Prediabetes: Secondary | ICD-10-CM | POA: Diagnosis not present

## 2023-07-12 DIAGNOSIS — E782 Mixed hyperlipidemia: Secondary | ICD-10-CM | POA: Diagnosis not present

## 2023-07-12 DIAGNOSIS — I739 Peripheral vascular disease, unspecified: Secondary | ICD-10-CM | POA: Diagnosis not present

## 2023-07-12 DIAGNOSIS — N1831 Chronic kidney disease, stage 3a: Secondary | ICD-10-CM | POA: Diagnosis not present

## 2023-07-19 DIAGNOSIS — Z0000 Encounter for general adult medical examination without abnormal findings: Secondary | ICD-10-CM | POA: Diagnosis not present

## 2023-07-19 DIAGNOSIS — R7303 Prediabetes: Secondary | ICD-10-CM | POA: Diagnosis not present

## 2023-07-19 DIAGNOSIS — N1831 Chronic kidney disease, stage 3a: Secondary | ICD-10-CM | POA: Diagnosis not present

## 2023-07-19 DIAGNOSIS — M159 Polyosteoarthritis, unspecified: Secondary | ICD-10-CM | POA: Diagnosis not present

## 2023-07-19 DIAGNOSIS — M1000 Idiopathic gout, unspecified site: Secondary | ICD-10-CM | POA: Diagnosis not present

## 2023-07-19 DIAGNOSIS — I872 Venous insufficiency (chronic) (peripheral): Secondary | ICD-10-CM | POA: Diagnosis not present

## 2023-07-19 DIAGNOSIS — E782 Mixed hyperlipidemia: Secondary | ICD-10-CM | POA: Diagnosis not present

## 2023-07-19 DIAGNOSIS — I10 Essential (primary) hypertension: Secondary | ICD-10-CM | POA: Diagnosis not present

## 2023-07-20 ENCOUNTER — Ambulatory Visit (INDEPENDENT_AMBULATORY_CARE_PROVIDER_SITE_OTHER): Payer: Medicare Other

## 2023-07-20 DIAGNOSIS — I441 Atrioventricular block, second degree: Secondary | ICD-10-CM | POA: Diagnosis not present

## 2023-07-20 LAB — CUP PACEART REMOTE DEVICE CHECK
Battery Remaining Longevity: 49 mo
Battery Voltage: 2.95 V
Brady Statistic AP VP Percent: 77.35 %
Brady Statistic AP VS Percent: 0.56 %
Brady Statistic AS VP Percent: 10.82 %
Brady Statistic AS VS Percent: 11.27 %
Brady Statistic RA Percent Paced: 78.67 %
Brady Statistic RV Percent Paced: 88.17 %
Date Time Interrogation Session: 20250515021430
Implantable Lead Connection Status: 753985
Implantable Lead Connection Status: 753985
Implantable Lead Implant Date: 20190206
Implantable Lead Implant Date: 20190206
Implantable Lead Location: 753859
Implantable Lead Location: 753860
Implantable Lead Model: 5076
Implantable Lead Model: 5076
Implantable Pulse Generator Implant Date: 20190206
Lead Channel Impedance Value: 304 Ohm
Lead Channel Impedance Value: 361 Ohm
Lead Channel Impedance Value: 399 Ohm
Lead Channel Impedance Value: 456 Ohm
Lead Channel Pacing Threshold Amplitude: 0.5 V
Lead Channel Pacing Threshold Amplitude: 0.75 V
Lead Channel Pacing Threshold Pulse Width: 0.4 ms
Lead Channel Pacing Threshold Pulse Width: 0.4 ms
Lead Channel Sensing Intrinsic Amplitude: 2 mV
Lead Channel Sensing Intrinsic Amplitude: 2 mV
Lead Channel Sensing Intrinsic Amplitude: 5.125 mV
Lead Channel Sensing Intrinsic Amplitude: 5.125 mV
Lead Channel Setting Pacing Amplitude: 2 V
Lead Channel Setting Pacing Amplitude: 2.5 V
Lead Channel Setting Pacing Pulse Width: 0.4 ms
Lead Channel Setting Sensing Sensitivity: 0.9 mV
Zone Setting Status: 755011
Zone Setting Status: 755011

## 2023-07-21 DIAGNOSIS — E1142 Type 2 diabetes mellitus with diabetic polyneuropathy: Secondary | ICD-10-CM | POA: Diagnosis not present

## 2023-07-21 DIAGNOSIS — M216X1 Other acquired deformities of right foot: Secondary | ICD-10-CM | POA: Diagnosis not present

## 2023-07-21 DIAGNOSIS — I739 Peripheral vascular disease, unspecified: Secondary | ICD-10-CM | POA: Diagnosis not present

## 2023-07-21 DIAGNOSIS — B351 Tinea unguium: Secondary | ICD-10-CM | POA: Diagnosis not present

## 2023-07-21 DIAGNOSIS — I70203 Unspecified atherosclerosis of native arteries of extremities, bilateral legs: Secondary | ICD-10-CM | POA: Diagnosis not present

## 2023-07-21 DIAGNOSIS — M216X2 Other acquired deformities of left foot: Secondary | ICD-10-CM | POA: Diagnosis not present

## 2023-07-26 DIAGNOSIS — M545 Low back pain, unspecified: Secondary | ICD-10-CM | POA: Diagnosis not present

## 2023-07-27 DIAGNOSIS — I951 Orthostatic hypotension: Secondary | ICD-10-CM | POA: Diagnosis not present

## 2023-07-28 ENCOUNTER — Ambulatory Visit: Payer: Self-pay | Admitting: Cardiovascular Disease

## 2023-08-29 NOTE — Progress Notes (Signed)
 Remote pacemaker transmission.

## 2023-10-10 ENCOUNTER — Other Ambulatory Visit: Payer: Self-pay | Admitting: Cardiovascular Disease

## 2023-10-10 DIAGNOSIS — I495 Sick sinus syndrome: Secondary | ICD-10-CM

## 2023-10-10 DIAGNOSIS — I471 Supraventricular tachycardia, unspecified: Secondary | ICD-10-CM

## 2023-10-16 DIAGNOSIS — M79671 Pain in right foot: Secondary | ICD-10-CM | POA: Diagnosis not present

## 2023-10-16 DIAGNOSIS — M109 Gout, unspecified: Secondary | ICD-10-CM | POA: Diagnosis not present

## 2023-10-18 DIAGNOSIS — B351 Tinea unguium: Secondary | ICD-10-CM | POA: Diagnosis not present

## 2023-10-18 DIAGNOSIS — E1142 Type 2 diabetes mellitus with diabetic polyneuropathy: Secondary | ICD-10-CM | POA: Diagnosis not present

## 2023-10-18 DIAGNOSIS — I739 Peripheral vascular disease, unspecified: Secondary | ICD-10-CM | POA: Diagnosis not present

## 2023-10-18 DIAGNOSIS — I70203 Unspecified atherosclerosis of native arteries of extremities, bilateral legs: Secondary | ICD-10-CM | POA: Diagnosis not present

## 2023-10-18 DIAGNOSIS — M216X2 Other acquired deformities of left foot: Secondary | ICD-10-CM | POA: Diagnosis not present

## 2023-10-18 DIAGNOSIS — M216X1 Other acquired deformities of right foot: Secondary | ICD-10-CM | POA: Diagnosis not present

## 2023-10-19 ENCOUNTER — Ambulatory Visit (INDEPENDENT_AMBULATORY_CARE_PROVIDER_SITE_OTHER): Payer: Medicare Other

## 2023-10-19 DIAGNOSIS — I441 Atrioventricular block, second degree: Secondary | ICD-10-CM | POA: Diagnosis not present

## 2023-10-19 LAB — CUP PACEART REMOTE DEVICE CHECK
Battery Remaining Longevity: 42 mo
Battery Voltage: 2.94 V
Brady Statistic AP VP Percent: 81.76 %
Brady Statistic AP VS Percent: 0.34 %
Brady Statistic AS VP Percent: 10.77 %
Brady Statistic AS VS Percent: 7.13 %
Brady Statistic RA Percent Paced: 83.01 %
Brady Statistic RV Percent Paced: 92.53 %
Date Time Interrogation Session: 20250814021246
Implantable Lead Connection Status: 753985
Implantable Lead Connection Status: 753985
Implantable Lead Implant Date: 20190206
Implantable Lead Implant Date: 20190206
Implantable Lead Location: 753859
Implantable Lead Location: 753860
Implantable Lead Model: 5076
Implantable Lead Model: 5076
Implantable Pulse Generator Implant Date: 20190206
Lead Channel Impedance Value: 304 Ohm
Lead Channel Impedance Value: 361 Ohm
Lead Channel Impedance Value: 361 Ohm
Lead Channel Impedance Value: 418 Ohm
Lead Channel Pacing Threshold Amplitude: 0.5 V
Lead Channel Pacing Threshold Amplitude: 0.75 V
Lead Channel Pacing Threshold Pulse Width: 0.4 ms
Lead Channel Pacing Threshold Pulse Width: 0.4 ms
Lead Channel Sensing Intrinsic Amplitude: 2.125 mV
Lead Channel Sensing Intrinsic Amplitude: 2.125 mV
Lead Channel Sensing Intrinsic Amplitude: 3.75 mV
Lead Channel Sensing Intrinsic Amplitude: 3.75 mV
Lead Channel Setting Pacing Amplitude: 2 V
Lead Channel Setting Pacing Amplitude: 2.5 V
Lead Channel Setting Pacing Pulse Width: 0.4 ms
Lead Channel Setting Sensing Sensitivity: 0.9 mV
Zone Setting Status: 755011
Zone Setting Status: 755011

## 2023-10-29 ENCOUNTER — Ambulatory Visit: Payer: Self-pay | Admitting: Cardiovascular Disease

## 2023-10-31 ENCOUNTER — Other Ambulatory Visit: Payer: Self-pay | Admitting: Orthopedic Surgery

## 2023-10-31 DIAGNOSIS — M5459 Other low back pain: Secondary | ICD-10-CM | POA: Diagnosis not present

## 2023-10-31 DIAGNOSIS — M545 Low back pain, unspecified: Secondary | ICD-10-CM

## 2023-11-08 ENCOUNTER — Ambulatory Visit
Admission: RE | Admit: 2023-11-08 | Discharge: 2023-11-08 | Disposition: A | Discharge status: Home / Self Care | Source: Ambulatory Visit | Attending: Orthopedic Surgery | Admitting: Orthopedic Surgery

## 2023-11-08 DIAGNOSIS — M48061 Spinal stenosis, lumbar region without neurogenic claudication: Secondary | ICD-10-CM | POA: Diagnosis not present

## 2023-11-08 DIAGNOSIS — M4317 Spondylolisthesis, lumbosacral region: Secondary | ICD-10-CM | POA: Diagnosis not present

## 2023-11-08 DIAGNOSIS — M5126 Other intervertebral disc displacement, lumbar region: Secondary | ICD-10-CM | POA: Diagnosis not present

## 2023-11-08 DIAGNOSIS — M47817 Spondylosis without myelopathy or radiculopathy, lumbosacral region: Secondary | ICD-10-CM | POA: Diagnosis not present

## 2023-11-08 DIAGNOSIS — M545 Low back pain, unspecified: Secondary | ICD-10-CM

## 2023-11-16 NOTE — Progress Notes (Unsigned)
 Cardiology Office Note:  .   Date:  11/16/2023  ID:  [name], DOB 1940-07-05, MRN [id number] PCP: Verdia Lombard, MD  [facility] Providers Cardiologist:  [name] Balding, MD Electrophysiologist:  OLE ONEIDA HOLTS, MD {  History of Present Illness: .   [name] is a 83 y.o. male w/PMHx of  OSA, gout, HTN, HLD chronic organized clot in one of the popliteal veins from a remote DVT  SSSx > advanced heart block > PPM SVT, AVNRT  Last saw EP w/Dr. HOLTS 05/05/20, discussed hx of SVT (found via his PPM) > EP showed poor antegrade fast pathway conduction and clear evidence of dual antegrade AVN physiology. The decision was made at that EP study to not ablate the slow pathway given the high likelihood that he would develop complete heart block and complete dependence on his PPM and started on amiodarone  plan to continue amiodarone  if possible to control the AVNRT episdoes. If they become more frequent or symptomatic, can consider repeat EP study with ablation   Following with Dr. Balding regularly Last on 05/18/23, intermittent ankle swelling left ankle chronically since remote DVT, resolves. Feeling well, device checked and functioning well, no arrhythmias Amio labs were UTD and WNL Recommended annual visit  Today's visit is scheduled as an annual visit ROS: ***  *** SVT burden *** amio labs *** quick check/arrhythmia burden  Device information MDT dual chamber PPM implanted 04/12/2017  Arrhythmia/AAD hx SVT Amiodarone  started Jan 2022  Studies Reviewed: SABRA    EKG done today and reviewed by myself:  ***  DEVICE interrogation  Full in clinic check 05/18/23 w/Dr. C Remote 10/19/23 normal without arrhythmias *** brief check done today and reviewed by myself for arrhythmia burden *** Battery and *** auto lead measurements are good ***   02/14/2020: EPS CONCLUSIONS:  1. Sinus rhythm upon presentation.  2. Very poor antegrade conduction with a  prolonged PR. Retrograde conduction is more robust than antegrade conduction.  3. His clinical arrhythmias are consistent with typical AV nodal reentrant tachycardia.  4. No sustained arrhythmias were inducible during EP study despite isoproterenol . 5. AV conduction appears poor. 6. No early apparent complications.  7. If patient develops worsening arrhythmias, consider bringing back to the EP lab for retrograde fast pathway mapping and ablation.  05/14/2019: TTE  1. Apical and apical septal hypokinesis.. Left ventricular ejection  fraction, by estimation, is 50 to 55%. The left ventricle has low normal  function. The left ventricle has no regional wall motion abnormalities.  Left ventricular diastolic parameters  are consistent with Grade I diastolic dysfunction (impaired relaxation).  Elevated left ventricular end-diastolic pressure.   2. Right ventricular systolic function is normal. The right ventricular  size is normal. There is normal pulmonary artery systolic pressure.   3. Left atrial size was moderately dilated.   4. The mitral valve is normal in structure. Trivial mitral valve  regurgitation. No evidence of mitral stenosis.   5. The aortic valve is tricuspid. Aortic valve regurgitation is not  visualized. No aortic stenosis is present.   6. The inferior vena cava is normal in size with greater than 50%  respiratory variability, suggesting right atrial pressure of 3 mmHg.   Risk Assessment/Calculations:    Physical Exam:   VS:  There were no vitals taken for this visit.   Wt Readings from Last 3 Encounters:  05/18/23 208 lb 3.2 oz (94.4 kg)  05/09/22 211 lb 6.4 oz (95.9 kg)  05/26/21 230 lb (  104.3 kg)    GEN: Well nourished, well developed in no acute distress NECK: No JVD; No carotid bruits CARDIAC: ***RRR, no murmurs, rubs, gallops RESPIRATORY:  *** CTA b/l without rales, wheezing or rhonchi  ABDOMEN: Soft, non-tender, non-distended EXTREMITIES: *** No edema; No  deformity   PPM site: *** is stable, no thinning, fluctuation, tethering  ASSESSMENT AND PLAN: .    SVT EPS as above, not ablated Amiodarone  chronically  *** burden *** labs  PPM *** intact function *** no programming changes made Followed by Dr. Francyne    {Are you ordering a CV Procedure (e.g. stress test, cath, DCCV, TEE, etc)?   Press F2        :[id number]}     Dispo: ***  Signed, Charlies Macario Arthur, PA-C

## 2023-11-17 ENCOUNTER — Ambulatory Visit: Attending: Physician Assistant | Admitting: Physician Assistant

## 2023-11-17 ENCOUNTER — Encounter: Payer: Self-pay | Admitting: Physician Assistant

## 2023-11-17 DIAGNOSIS — R0609 Other forms of dyspnea: Secondary | ICD-10-CM

## 2023-11-17 DIAGNOSIS — Z950 Presence of cardiac pacemaker: Secondary | ICD-10-CM | POA: Diagnosis not present

## 2023-11-17 DIAGNOSIS — I471 Supraventricular tachycardia, unspecified: Secondary | ICD-10-CM | POA: Diagnosis not present

## 2023-11-17 DIAGNOSIS — G4730 Sleep apnea, unspecified: Secondary | ICD-10-CM | POA: Diagnosis not present

## 2023-11-17 DIAGNOSIS — Z5181 Encounter for therapeutic drug level monitoring: Secondary | ICD-10-CM

## 2023-11-17 DIAGNOSIS — Z79899 Other long term (current) drug therapy: Secondary | ICD-10-CM

## 2023-11-17 LAB — CUP PACEART INCLINIC DEVICE CHECK
Battery Remaining Longevity: 40 mo
Battery Voltage: 2.94 V
Brady Statistic AP VP Percent: 81.15 %
Brady Statistic AP VS Percent: 0.71 %
Brady Statistic AS VP Percent: 10.06 %
Brady Statistic AS VS Percent: 8.07 %
Brady Statistic RA Percent Paced: 82.65 %
Brady Statistic RV Percent Paced: 91.22 %
Date Time Interrogation Session: 20250912174827
Implantable Lead Connection Status: 753985
Implantable Lead Connection Status: 753985
Implantable Lead Implant Date: 20190206
Implantable Lead Implant Date: 20190206
Implantable Lead Location: 753859
Implantable Lead Location: 753860
Implantable Lead Model: 5076
Implantable Lead Model: 5076
Implantable Pulse Generator Implant Date: 20190206
Lead Channel Impedance Value: 323 Ohm
Lead Channel Impedance Value: 380 Ohm
Lead Channel Impedance Value: 399 Ohm
Lead Channel Impedance Value: 437 Ohm
Lead Channel Pacing Threshold Amplitude: 0.5 V
Lead Channel Pacing Threshold Amplitude: 0.75 V
Lead Channel Pacing Threshold Pulse Width: 0.4 ms
Lead Channel Pacing Threshold Pulse Width: 0.4 ms
Lead Channel Sensing Intrinsic Amplitude: 1.75 mV
Lead Channel Sensing Intrinsic Amplitude: 2.25 mV
Lead Channel Sensing Intrinsic Amplitude: 5.25 mV
Lead Channel Sensing Intrinsic Amplitude: 5.5 mV
Lead Channel Setting Pacing Amplitude: 2 V
Lead Channel Setting Pacing Amplitude: 2.5 V
Lead Channel Setting Pacing Pulse Width: 0.4 ms
Lead Channel Setting Sensing Sensitivity: 0.9 mV
Zone Setting Status: 755011
Zone Setting Status: 755011

## 2023-11-17 NOTE — Patient Instructions (Signed)
 Medication Instructions:   Your physician recommends that you continue on your current medications as directed. Please refer to the Current Medication list given to you today.  *If you need a refill on your cardiac medications before your next appointment, please call your pharmacy*  Lab Work:  PLEASE GO DOWN STAIRS  [facility]  FIRST FLOOR   ( GET OFF ELEVATORS WALK TOWARDS WAITING AREA LAB LOCATED BY PHARMACY):  LFT TODAY     If you have labs (blood work) drawn today and your tests are completely normal, you will receive your results only by: MyChart Message (if you have MyChart) OR A paper copy in the mail If you have any lab test that is abnormal or we need to change your treatment, we will call you to review the results.  Testing/Procedures: Your physician has requested that you have an echocardiogram. Echocardiography is a painless test that uses sound waves to create images of your heart. It provides your doctor with information about the size and shape of your heart and how well your heart's chambers and valves are working. This procedure takes approximately one hour. There are no restrictions for this procedure. Please do NOT wear cologne, perfume, aftershave, or lotions (deodorant is allowed). Please arrive 15 minutes prior to your appointment time.  Please note: We ask at that you not bring children with you during ultrasound (echo/ vascular) testing. Due to room size and safety concerns, children are not allowed in the ultrasound rooms during exams. Our front office staff cannot provide observation of children in our lobby area while testing is being conducted. An adult accompanying a patient to their appointment will only be allowed in the ultrasound room at the discretion of the ultrasound technician under special circumstances. We apologize for any inconvenience.  Your physician has recommended that you have a pulmonary function test. Pulmonary Function Tests are a group of tests  that measure how well air moves in and out of your lungs.    Follow-Up: At [facility], you and your health needs are our priority.  As part of our continuing mission to provide you with exceptional heart care, our providers are all part of one team.  This team includes your primary Cardiologist (physician) and Advanced Practice Providers or APPs (Physician Assistants and Nurse Practitioners) who all work together to provide you with the care you need, when you need it.  Your next appointment:  CONTACT [facility]  EP ( ELECTROPHYSIOLOGY) DEPARTMENT  [phone] AS NEEDED FOR  ANY CARDIAC RELATED SYMPTOMS  We recommend signing up for the patient portal called MyChart.  Sign up information is provided on this After Visit Summary.  MyChart is used to connect with patients for Virtual Visits (Telemedicine).  Patients are able to view lab/test results, encounter notes, upcoming appointments, etc.  Non-urgent messages can be sent to your provider as well.   To learn more about what you can do with MyChart, go to [email or web address].   Other Instructions

## 2023-11-18 LAB — HEPATIC FUNCTION PANEL
ALT: 56 IU/L — ABNORMAL HIGH (ref 0–44)
AST: 49 IU/L — ABNORMAL HIGH (ref 0–40)
Albumin: 4 g/dL (ref 3.7–4.7)
Alkaline Phosphatase: 72 IU/L (ref 44–121)
Bilirubin Total: 0.6 mg/dL (ref 0.0–1.2)
Bilirubin, Direct: 0.23 mg/dL (ref 0.00–0.40)
Total Protein: 6.5 g/dL (ref 6.0–8.5)

## 2023-11-21 ENCOUNTER — Ambulatory Visit: Payer: Self-pay | Admitting: Physician Assistant

## 2023-11-23 DIAGNOSIS — N200 Calculus of kidney: Secondary | ICD-10-CM | POA: Diagnosis not present

## 2023-11-23 DIAGNOSIS — R3915 Urgency of urination: Secondary | ICD-10-CM | POA: Diagnosis not present

## 2023-11-23 DIAGNOSIS — R350 Frequency of micturition: Secondary | ICD-10-CM | POA: Diagnosis not present

## 2023-11-24 DIAGNOSIS — M545 Low back pain, unspecified: Secondary | ICD-10-CM | POA: Diagnosis not present

## 2023-11-30 ENCOUNTER — Telehealth: Payer: Self-pay

## 2023-11-30 NOTE — Telephone Encounter (Signed)
 Outreach made to [employer].  Per tech services, electrical reset happened because MVP mode switch and NCAP are both programmed ON and there was a timing interaction between these algorithms.  This was potentially exacerbated by the amount of time that the Pt is  V pacing (94.9%).  Per tech services, one of these algorithms should be turned off.    Will follow up with Dr. JAYSON to see if Pt should be reprogrammed DDDR with shortened delays.  Will follow up.

## 2023-11-30 NOTE — Telephone Encounter (Signed)
 Discussed with Dr. JAYSON.  Reprogram Pt to DDDR with SAV 150 ms PAV 180 ms.  Will schedule device clinic appointment.

## 2023-11-30 NOTE — Progress Notes (Signed)
 Remote PPM Transmission

## 2023-11-30 NOTE — Telephone Encounter (Signed)
 Alert received:  Alert remote transmission:  Diagnostic reset occurred.

## 2023-12-01 ENCOUNTER — Ambulatory Visit: Attending: Internal Medicine

## 2023-12-01 DIAGNOSIS — I495 Sick sinus syndrome: Secondary | ICD-10-CM

## 2023-12-01 LAB — CUP PACEART INCLINIC DEVICE CHECK
Date Time Interrogation Session: 20250926152145
Implantable Lead Connection Status: 753985
Implantable Lead Connection Status: 753985
Implantable Lead Implant Date: 20190206
Implantable Lead Implant Date: 20190206
Implantable Lead Location: 753859
Implantable Lead Location: 753860
Implantable Lead Model: 5076
Implantable Lead Model: 5076
Implantable Pulse Generator Implant Date: 20190206

## 2023-12-01 NOTE — Telephone Encounter (Signed)
 LM requesting patient call us  back to set up in clinic check with programming changes.

## 2023-12-01 NOTE — Telephone Encounter (Signed)
 Attempted to call patient back. He did not answer. Left a message requesting he call us  at [phone].

## 2023-12-01 NOTE — Telephone Encounter (Signed)
Pt returning call / please advise  

## 2023-12-01 NOTE — Telephone Encounter (Signed)
 Spoke with patient.  He is coming in at 3:30pm this afternoon for reprogramming per Dr. Tyrone orders.  Patient states he has noticed feeling more SOB than usual.

## 2023-12-01 NOTE — Progress Notes (Signed)
 Patient brought in due to receiving an alert for Electrical Reset on device. Patient states that he has been symptomatic over the past week feeling a little more SOB with exertion.  Says he has a lung test coming up in a couple of weeks.  Not sure if related to reset but will see if re-programming of device helps his symptoms.   Reviewed with Dr. Francyne (See Phone Note: 11/30/22) who orders the following changes that were made to device during check today:   1.  Change from MVP (AAIR -DDDR) to DDDR 2.  PAV/SAV 351ms,350ms to PAV/SAV: 180/150.   Full Device interrogation performed today.  Normal Device function, no episodes, HG/CC normal, no concerns. Thresholds, sensing and impedances all normal and stable per trends.  Changes made as noted above.   Patient given device clinic number to call next week and provide an update on how he is doing.

## 2023-12-11 ENCOUNTER — Telehealth: Payer: Self-pay

## 2023-12-11 NOTE — Telephone Encounter (Signed)
 Patient called Device Clinic today and LM on VM to update us  on how he is feeling since recent electrical reset and programming changes.    (See prior phone note and clinic visit with changes).   Patient requests a call back.

## 2023-12-12 NOTE — Telephone Encounter (Signed)
 Patient calling to report that he is doing very well since the recent programming adjustments that were made to his device.  His breathing is better and he just wanted us  to know.

## 2023-12-25 ENCOUNTER — Ambulatory Visit (HOSPITAL_COMMUNITY)
Admission: RE | Admit: 2023-12-25 | Discharge: 2023-12-25 | Disposition: A | Discharge status: Home / Self Care | Source: Ambulatory Visit | Attending: Cardiology | Admitting: Cardiology

## 2023-12-25 DIAGNOSIS — R0609 Other forms of dyspnea: Secondary | ICD-10-CM | POA: Diagnosis not present

## 2023-12-25 LAB — ECHOCARDIOGRAM COMPLETE
Area-P 1/2: 3.56 cm2
MV M vel: 4.02 m/s
MV Peak grad: 64.5 mmHg
MV VTI: 1.34 cm2
P 1/2 time: 675 ms
Radius: 0.75 cm
S' Lateral: 3.2 cm

## 2023-12-27 DIAGNOSIS — M25522 Pain in left elbow: Secondary | ICD-10-CM | POA: Diagnosis not present

## 2024-01-03 ENCOUNTER — Telehealth: Payer: Self-pay | Admitting: Physician Assistant

## 2024-01-03 ENCOUNTER — Telehealth: Payer: Self-pay | Admitting: Cardiovascular Disease

## 2024-01-03 DIAGNOSIS — R0609 Other forms of dyspnea: Secondary | ICD-10-CM

## 2024-01-03 DIAGNOSIS — Z5181 Encounter for therapeutic drug level monitoring: Secondary | ICD-10-CM

## 2024-01-03 NOTE — Telephone Encounter (Signed)
 Spoke with pt, aware of echo results. Order placed for CT scan.

## 2024-01-03 NOTE — Telephone Encounter (Signed)
 Call addressed. See documentation in chart.

## 2024-01-03 NOTE — Telephone Encounter (Signed)
-----   Message from Charlies Macario Arthur sent at 12/26/2023  2:48 PM EDT ----- Echo looks OK. Heart has normal strength, no murmurs of any significance or concern. Heart muscle is a little stiff, often seen with aging, and or HTN. Sometimes can cause some SOB, though I did not appreciate any fluid retention. I had planned initially to have him get PFTs done to look for any evidence of pulmonary side effects of the amiodarone . But instead, lets cancel the PFTs and have him do a  High resolution CT scan of the chest to evaluate for pulmonary fibrosis 2/2 amiodarone  therapy This will be a better evaluation for that ----- Message ----- From: Interface, [employer] Sent: 12/25/2023  10:25 AM EDT To: Charlies Macario Arthur, PA-C

## 2024-01-03 NOTE — Telephone Encounter (Signed)
 Patient called to follow-up on his Echocardiogram test results.

## 2024-01-03 NOTE — Telephone Encounter (Signed)
 Lvm on home and cell  to call back to clinic for results

## 2024-01-03 NOTE — Telephone Encounter (Signed)
 Patient calling for his echo results.

## 2024-01-18 ENCOUNTER — Ambulatory Visit: Payer: Medicare Other

## 2024-01-18 DIAGNOSIS — I495 Sick sinus syndrome: Secondary | ICD-10-CM | POA: Diagnosis not present

## 2024-01-19 LAB — CUP PACEART REMOTE DEVICE CHECK
Battery Remaining Longevity: 39 mo
Battery Voltage: 2.94 V
Brady Statistic AP VP Percent: 90.59 %
Brady Statistic AP VS Percent: 0.13 %
Brady Statistic AS VP Percent: 9.15 %
Brady Statistic AS VS Percent: 0.13 %
Brady Statistic RA Percent Paced: 90.82 %
Brady Statistic RV Percent Paced: 99.74 %
Date Time Interrogation Session: 20251113011516
Implantable Lead Connection Status: 753985
Implantable Lead Connection Status: 753985
Implantable Lead Implant Date: 20190206
Implantable Lead Implant Date: 20190206
Implantable Lead Location: 753859
Implantable Lead Location: 753860
Implantable Lead Model: 5076
Implantable Lead Model: 5076
Implantable Pulse Generator Implant Date: 20190206
Lead Channel Impedance Value: 323 Ohm
Lead Channel Impedance Value: 361 Ohm
Lead Channel Impedance Value: 361 Ohm
Lead Channel Impedance Value: 399 Ohm
Lead Channel Pacing Threshold Amplitude: 0.5 V
Lead Channel Pacing Threshold Amplitude: 0.75 V
Lead Channel Pacing Threshold Pulse Width: 0.4 ms
Lead Channel Pacing Threshold Pulse Width: 0.4 ms
Lead Channel Sensing Intrinsic Amplitude: 1.5 mV
Lead Channel Sensing Intrinsic Amplitude: 1.5 mV
Lead Channel Sensing Intrinsic Amplitude: 3.875 mV
Lead Channel Sensing Intrinsic Amplitude: 3.875 mV
Lead Channel Setting Pacing Amplitude: 2 V
Lead Channel Setting Pacing Amplitude: 2.5 V
Lead Channel Setting Pacing Pulse Width: 0.4 ms
Lead Channel Setting Sensing Sensitivity: 0.9 mV
Zone Setting Status: 755011
Zone Setting Status: 755011

## 2024-01-23 NOTE — Progress Notes (Signed)
 Remote PPM Transmission

## 2024-01-29 ENCOUNTER — Ambulatory Visit: Payer: Self-pay | Admitting: Cardiovascular Disease

## 2024-02-07 ENCOUNTER — Telehealth: Payer: Self-pay | Admitting: Cardiovascular Disease

## 2024-02-07 NOTE — Telephone Encounter (Signed)
 Would like to get a new transmission as soon as he gets the device.

## 2024-02-07 NOTE — Telephone Encounter (Signed)
 Is it possible to do an extra pacemaker download to look for arrhythmia please?

## 2024-02-07 NOTE — Telephone Encounter (Signed)
 Spoke with tech support and they are sending pt a new handheld. Pt should receive it in 7-10 days, pt is unable to send transmission today

## 2024-02-07 NOTE — Telephone Encounter (Signed)
 Called patient to send manual transmission. No answer, LMTCB.

## 2024-02-07 NOTE — Telephone Encounter (Signed)
 Pt c/o Shortness Of Breath: STAT if SOB developed within the last 24 hours or pt is noticeably SOB on the phone  1. Are you currently SOB (can you hear that pt is SOB on the phone)? no  2. How long have you been experiencing SOB? Few days   3. Are you SOB when sitting or when up moving around? Both   4. Are you currently experiencing any other symptoms? No

## 2024-02-07 NOTE — Telephone Encounter (Signed)
 Patient called back. He wasn't at home, will call us  back once he gets near the monitor for us  to walk him through how to send the transmission.

## 2024-02-07 NOTE — Telephone Encounter (Signed)
 The device team will assume care for the remote transmission aspect. As soon as remote transmission is received, we will forward to Dr. Francyne.  If patient needs follow up other than remote transmission, gen cards will need to assume at that point.

## 2024-02-07 NOTE — Telephone Encounter (Signed)
 Returned call to patient of Dr. Francyne  He reports episodic shortness of breath, described as attacks that have occurred over several days only in AM with pain under right breast, lasting 30 minutes, occurring after bending over to put on shoes, associated with diaphoresis. He will wake up in the night to use bathroom and has no issues. (+) known sick contacts. No fevers, chills, body aches. No head or chest congestion to report. No cough or post-nasal drip. No change in energy level. No weight gain ~215lbs. (+) intermittent swelling of left leg, resolves with elevation. Feels unbalanced. Mechanical fall 1 week ago - did not hit head.   BP elevated over last few days 160/98 180/100   He has chest CT scheduled for tomorrow for amio monitoring.  Advised he go to PCP or UC for eval of acute symptoms with known sick contacts, intermittent diaphoresis to r/u infectious process.   Will send to MD to review.

## 2024-02-08 ENCOUNTER — Ambulatory Visit (HOSPITAL_COMMUNITY): Admission: RE | Admit: 2024-02-08 | Discharge: 2024-02-08 | Attending: Physician Assistant

## 2024-02-08 DIAGNOSIS — Z5181 Encounter for therapeutic drug level monitoring: Secondary | ICD-10-CM | POA: Insufficient documentation

## 2024-02-08 DIAGNOSIS — Z79899 Other long term (current) drug therapy: Secondary | ICD-10-CM | POA: Insufficient documentation

## 2024-02-12 ENCOUNTER — Ambulatory Visit: Payer: Self-pay | Admitting: Physician Assistant

## 2024-02-12 MED ORDER — FUROSEMIDE 40 MG PO TABS: 40.0000 mg | ORAL_TABLET | Freq: Every day | ORAL | 3 refills | Status: AC

## 2024-02-12 NOTE — Telephone Encounter (Signed)
 [name], [name], MD to Jared Charlies Helling, PA-C  Jared Comer CROME, RN  (Selected Message)    02/12/24  3:17 PM Result Note His last echocardiogram did show signs of elevated filling pressures.  I'd say we should start him on furosemide  40 mg daily (he is already taking a potassium supplement and losartan  according to his medication list) and bring him in to see first available APP next week.  If possible, we should probably start on Farxiga or Jardiance unless there is a contraindication or financial barrier.  Please also make him an appointment with me next month to follow-up.  Needs RX for Furosemide  40 mg daily. Needs appt with APP or DOD next week.  Then appt in January with Jared C.   Called the patient, no answer, left message with call back number.   Patient called back, informed him that he is holding on to some fluid and [name] would like for him to start on Furosemide  40 mg daily. And to see a provider next week, then see him next month.  RX sent into the pt's preferred pharmacy.   He asked for help with sending his transmission. Informed him that I will call him back to schedule appointments. Transferred him to a Device nurse who talked him through sending a remote transmission.   Called the patient back and informed that the RX was sent to his pharmacy. Informed of appointments. Given education on prescription. Scheduled f/u appt with Jared Louis, NP- 02/19/24 at 2:45 [name]- 03/15/24 at 0920 He verbalized understanding of all information.

## 2024-02-12 NOTE — Telephone Encounter (Signed)
 Pt called back wanting to talk to a nurse

## 2024-02-14 NOTE — Telephone Encounter (Signed)
 Call back received from Pt.  Advised of normal manual transmission.  He has picked up his fluid pill and will start.  Advised to call if he is still SOB after starting the lasix .  Pt in agreement.

## 2024-02-14 NOTE — Telephone Encounter (Signed)
 Attempted to call the patient to follow up regarding normal transmission.  No answer- I left a message to please call back.

## 2024-02-14 NOTE — Telephone Encounter (Signed)
 Thank you.   Normal pacemaker download.  No evidence of arrhythmia to explain his symptoms.

## 2024-02-14 NOTE — Telephone Encounter (Signed)
 Reviewed the patient's chart.  Transmission received dated- 02/12/24.   Presenting rhythm: AP/VP. Normal Device Function. No arrhythmias noted. Histograms stable.    Will forward to Dr. Francyne to review.   _________________________________________________________________________

## 2024-02-19 ENCOUNTER — Encounter: Payer: Self-pay | Admitting: Emergency Medicine

## 2024-02-19 ENCOUNTER — Ambulatory Visit: Attending: Emergency Medicine | Admitting: Emergency Medicine

## 2024-02-19 VITALS — BP 112/50 | HR 74 | Ht 67.0 in | Wt 223.0 lb

## 2024-02-19 DIAGNOSIS — I4719 Other supraventricular tachycardia: Secondary | ICD-10-CM

## 2024-02-19 DIAGNOSIS — E785 Hyperlipidemia, unspecified: Secondary | ICD-10-CM

## 2024-02-19 DIAGNOSIS — G4733 Obstructive sleep apnea (adult) (pediatric): Secondary | ICD-10-CM

## 2024-02-19 DIAGNOSIS — I5032 Chronic diastolic (congestive) heart failure: Secondary | ICD-10-CM

## 2024-02-19 DIAGNOSIS — I10 Essential (primary) hypertension: Secondary | ICD-10-CM

## 2024-02-19 MED ORDER — EMPAGLIFLOZIN 10 MG PO TABS: 10.0000 mg | ORAL_TABLET | Freq: Every day | ORAL | 1 refills | Status: AC

## 2024-02-19 NOTE — Patient Instructions (Signed)
 Medication Instructions:  START JARDIANCE  10 MG DAILY.  Lab Work: NUTRITIONAL THERAPIST AND BNP TO BE DONE TODAY.  Testing/Procedures: NONE  Follow-Up: At [facility], you and your health needs are our priority.  As part of our continuing mission to provide you with exceptional heart care, our providers are all part of one team.  This team includes your primary Cardiologist (physician) and Advanced Practice Providers or APPs (Physician Assistants and Nurse Practitioners) who all work together to provide you with the care you need, when you need it.  Your next appointment:   KEEP 03-15-24 APPOINTMENT WITH DR. FRANCYNE AT 9:20 AM  Provider:   [name] Francyne, MD    We recommend signing up for the patient portal called MyChart.  Sign up information is provided on this After Visit Summary.  MyChart is used to connect with patients for Virtual Visits (Telemedicine).  Patients are able to view lab/test results, encounter notes, upcoming appointments, etc.  Non-urgent messages can be sent to your provider as well.   To learn more about what you can do with MyChart, go to [email or web address].

## 2024-02-19 NOTE — Progress Notes (Signed)
 Cardiology Office Note:    Date:  02/19/2024  ID:  AMILIO ZEHNDER, DOB May 07, 1940, MRN [id number] PCP: Verdia Lombard, MD  [facility] Providers Cardiologist:  [name] Balding, MD Electrophysiologist:  OLE ONEIDA HOLTS, MD       Patient Profile:       Chief Complaint: Acute visit for diastolic heart failure History of Present Illness:  [name] is a 83 y.o. male with visit-pertinent history of obstructive sleep apnea, gout, hypertension, hyperlipidemia, chronic DVT, SSS s/p PPM, SVT  Seen by EP on 05/2020 and discussed history of SVT found on PPM.  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  The decision was made not to ablate the slow pathway given the high likelihood that he would develop complete heart block and complete dependence on his PPM and he was started on amiodarone .  Plan to continue amiodarone  for possible to control AVNRT episodes.  If they became more frequent or symptomatic could consider repeat EP study with ablation.  Last seen by Dr. JAYSON on 05/2023.  Patient reported intermittent ankle swelling.  Left ankle chronically swollen since remote DVT.  Amiodarone  labs were unremarkable, no changes were made with recommended annual visit.  Last seen in clinic by EP on 11/17/2023.  No evidence of SVT on last PPM check.  Reported worsening of chronic dyspnea on exertion.  He underwent echocardiogram on 12/2023 showing LVEF 50 to 55%, LV with low normal function, global hypokinesis, grade 2 DD, elevated left atrial pressure, RV function is normal, RV size mildly enlarged, normal PASP, left atrial size severely dilated, moderate MR, moderate MAC, trivial AI.  He underwent CT chest high-resolution on 02/2024 showing nonspecific moderate patchy peribronchovascular consolidation and ground glass opacity throughout both lungs.  Favor bronco pneumonia or possibly cardiogenic pulmonary edema.  He was started on furosemide   40 mg daily.   Discussed the use of AI scribe software for clinical note transcription with the patient, who gave verbal consent to proceed.  History of Present Illness [name] is an 83 year old male who presents for follow-up of shortness of breath.  He has had shortness of breath for four to five months with fluctuating symptoms, but it has improved since his last visit.  Reports improved shortness of breath since starting on furosemide .  He is now able to walk short distances without dyspnea and has had no recent severe shortness of breath.  He denies any chest pains or exertional symptoms.  He denies any significant leg swelling and reports no changes from his chronic baseline.  His leg swelling does improve with elevation.  He sleeps most nights in a recliner while watching TV but denies shortness of breath when lying flat, paroxysmal nocturnal dyspnea, or needing the recliner to at night breathe.  He denies syncope, presyncope, lightheadedness, dizziness, palpitations   Review of systems:  Please see the history of present illness. All other systems are reviewed and otherwise negative.      Studies Reviewed:    EKG Interpretation Date/Time:  Monday February 19 2024 14:44:02 EST Ventricular Rate:  74 PR Interval:  176 QRS Duration:  208 QT Interval:  506 QTC Calculation: 561 R Axis:   -76  Text Interpretation: AV dual-paced rhythm When compared with ECG of 18-May-2023 08:25, Vent. rate has increased BY   9 BPM Confirmed by Rana Dixon [phone]) on 02/19/2024 4:34:51 PM    Echocardiogram 12/25/2023  1. Left ventricular ejection fraction, by  estimation, is 50 to 55%. The  left ventricle has low normal function. The left ventricle demonstrates  global hypokinesis. Left ventricular diastolic parameters are consistent  with Grade II diastolic dysfunction  (pseudonormalization). Elevated left atrial pressure. The average left  ventricular global longitudinal strain  is -15.0 %. The global longitudinal  strain is abnormal.   2. Right ventricular systolic function is normal. The right ventricular  size is mildly enlarged. There is normal pulmonary artery systolic  pressure. The estimated right ventricular systolic pressure is 21.8 mmHg.   3. Left atrial size was severely dilated.   4. The mitral valve is degenerative. Moderate mitral valve regurgitation.  No evidence of mitral stenosis. The mean mitral valve gradient is 2.0  mmHg. Moderate mitral annular calcification.   5. The aortic valve is tricuspid. Aortic valve regurgitation is trivial.  Aortic valve sclerosis/calcification is present, without any evidence of  aortic stenosis. Aortic regurgitation PHT measures 675 msec.   6. The inferior vena cava is normal in size with greater than 50%  respiratory variability, suggesting right atrial pressure of 3 mmHg.   Echocardiogram 05/14/2019  1. Apical and apical septal hypokinesis.. Left ventricular ejection  fraction, by estimation, is 50 to 55%. The left ventricle has low normal  function. The left ventricle has no regional wall motion abnormalities.  Left ventricular diastolic parameters  are consistent with Grade I diastolic dysfunction (impaired relaxation).  Elevated left ventricular end-diastolic pressure.   2. Right ventricular systolic function is normal. The right ventricular  size is normal. There is normal pulmonary artery systolic pressure.   3. Left atrial size was moderately dilated.   4. The mitral valve is normal in structure. Trivial mitral valve  regurgitation. No evidence of mitral stenosis.   5. The aortic valve is tricuspid. Aortic valve regurgitation is not  visualized. No aortic stenosis is present.   6. The inferior vena cava is normal in size with greater than 50%  respiratory variability, suggesting right atrial pressure of 3 mmHg.  Risk Assessment/Calculations:              Physical Exam:   VS:  BP (!) 112/50 (BP  Location: Left Arm, Patient Position: Sitting, Cuff Size: Normal)   Pulse 74   Ht 5' 7 (1.702 m)   Wt 223 lb (101.2 kg)   BMI 34.93 kg/m    Wt Readings from Last 3 Encounters:  02/19/24 223 lb (101.2 kg)  05/18/23 208 lb 3.2 oz (94.4 kg)  05/09/22 211 lb 6.4 oz (95.9 kg)    GEN: Well nourished, well developed in no acute distress NECK: No JVD; No carotid bruits CARDIAC: RRR, no murmurs, rubs, gallops RESPIRATORY:  Clear to auscultation without rales, wheezing or rhonchi  ABDOMEN: Soft, non-tender, non-distended EXTREMITIES:  No edema; No acute deformity      Assessment and Plan:  Diastolic heart failure Echocardiogram 12/2023 with LVEF 50 to 55%, global hypokinesis, grade 2 DD, elevated left atrial pressure CT chest 02/2024 showing possible pulmonary edema Was subsequently started on furosemide  on 12/8 - Today patient reports improved DOE since starting furosemide .  Reports symptoms have been ongoing for 4 to 5 months.  Denies ever experiencing orthopnea or PND.  Has had gradual weight gain.  No changes to chronic lower extremity edema - Today he appears euvolemic and well compensated on exam.  Lungs CTAB with 1+ bilateral LEE - Plan to start Jardiance  10 mg daily to further aid in diuresis - Continue furosemide  40 mg daily  and losartan  50 mg daily - BMET and BNP today  - Consider repeat CT chest x 6 months  AVNRT Quiescent and denies any palpitations No episodes have been recorded by his PPM since 07/2021 He remains arrhythmia free and asymptomatic on amiodarone  - Continue amiodarone  200 mg daily - TSH and LFTs completed within the last 6 months were stable  Hypertension Blood pressure today is stable at 112/50 - Continue losartan  50 mg daily  Obstructive sleep apnea - He reports compliance with CPAP therapy  Hyperlipidemia LDL 66 on 01/2024 and well-controlled - Continue atorvastatin  40 mg daily      Dispo:  Return in about 1 month (around  03/21/2024).  Signed, Lum LITTIE Louis, NP

## 2024-02-20 ENCOUNTER — Telehealth: Payer: Self-pay | Admitting: Pharmacy Technician

## 2024-02-20 ENCOUNTER — Telehealth: Payer: Self-pay | Admitting: Cardiovascular Disease

## 2024-02-20 ENCOUNTER — Other Ambulatory Visit (HOSPITAL_COMMUNITY): Payer: Self-pay

## 2024-02-20 LAB — BASIC METABOLIC PANEL WITH GFR
BUN/Creatinine Ratio: 14 (ref 10–24)
BUN: 20 mg/dL (ref 8–27)
CO2: 22 mmol/L (ref 20–29)
Calcium: 9.2 mg/dL (ref 8.6–10.2)
Chloride: 105 mmol/L (ref 96–106)
Creatinine, Ser: 1.4 mg/dL — ABNORMAL HIGH (ref 0.76–1.27)
Glucose: 93 mg/dL (ref 70–99)
Potassium: 4.4 mmol/L (ref 3.5–5.2)
Sodium: 143 mmol/L (ref 134–144)
eGFR: 50 mL/min/1.73 — ABNORMAL LOW (ref >59)

## 2024-02-20 LAB — BRAIN NATRIURETIC PEPTIDE: BNP: 179.1 pg/mL — ABNORMAL HIGH (ref 0.0–100.0)

## 2024-02-20 NOTE — Telephone Encounter (Signed)
 I got the patient a grant and now free

## 2024-02-20 NOTE — Telephone Encounter (Signed)
 Patient called back and is aware.

## 2024-02-20 NOTE — Telephone Encounter (Signed)
 Patient Advocate Encounter   The patient was approved for a [employer] grant that will help cover the cost of jardiance  Total amount awarded, [id number].  Effective: 01/21/24 - 01/19/25   APW:[id number] ERW:[id number] Group:[id number] PI:[id number] [employer] ID: [id number]   Pharmacy provided with approval and processing information. Patient informed via telephone      I called the patient to let him know this is now free at [facility]. I had to leave a message on both numbers

## 2024-02-20 NOTE — Telephone Encounter (Signed)
 Pt c/o medication issue:  1. Name of Medication: empagliflozin  (JARDIANCE ) 10 MG TABS tablet   2. How are you currently taking this medication (dosage and times per day)?   3. Are you having a reaction (difficulty breathing--STAT)?   4. What is your medication issue?    Patient called to report to M. Rana, NP that he did not get his prescription for Jardiance  as this medication is too expensive for him.

## 2024-02-22 ENCOUNTER — Ambulatory Visit: Payer: Self-pay | Admitting: Emergency Medicine

## 2024-02-22 DIAGNOSIS — Z79899 Other long term (current) drug therapy: Secondary | ICD-10-CM

## 2024-03-02 LAB — BASIC METABOLIC PANEL WITH GFR
BUN/Creatinine Ratio: 14 (ref 10–24)
BUN: 21 mg/dL (ref 8–27)
CO2: 22 mmol/L (ref 20–29)
Calcium: 9 mg/dL (ref 8.6–10.2)
Chloride: 104 mmol/L (ref 96–106)
Creatinine, Ser: 1.53 mg/dL — ABNORMAL HIGH (ref 0.76–1.27)
Glucose: 95 mg/dL (ref 70–99)
Potassium: 4.2 mmol/L (ref 3.5–5.2)
Sodium: 141 mmol/L (ref 134–144)
eGFR: 45 mL/min/1.73 — ABNORMAL LOW

## 2024-03-02 LAB — BRAIN NATRIURETIC PEPTIDE: BNP: 102.2 pg/mL — ABNORMAL HIGH (ref 0.0–100.0)

## 2024-03-15 ENCOUNTER — Encounter: Payer: Self-pay | Admitting: Cardiovascular Disease

## 2024-03-15 ENCOUNTER — Ambulatory Visit: Attending: Cardiovascular Disease | Admitting: Cardiovascular Disease

## 2024-03-15 VITALS — BP 104/44 | HR 73 | Ht 70.0 in | Wt 150.2 lb

## 2024-03-15 DIAGNOSIS — I10 Essential (primary) hypertension: Secondary | ICD-10-CM | POA: Diagnosis not present

## 2024-03-15 DIAGNOSIS — I5032 Chronic diastolic (congestive) heart failure: Secondary | ICD-10-CM | POA: Diagnosis not present

## 2024-03-15 DIAGNOSIS — Z950 Presence of cardiac pacemaker: Secondary | ICD-10-CM | POA: Diagnosis not present

## 2024-03-15 DIAGNOSIS — Z79899 Other long term (current) drug therapy: Secondary | ICD-10-CM

## 2024-03-15 DIAGNOSIS — I441 Atrioventricular block, second degree: Secondary | ICD-10-CM | POA: Diagnosis not present

## 2024-03-15 DIAGNOSIS — E7800 Pure hypercholesterolemia, unspecified: Secondary | ICD-10-CM | POA: Diagnosis not present

## 2024-03-15 DIAGNOSIS — Z5181 Encounter for therapeutic drug level monitoring: Secondary | ICD-10-CM

## 2024-03-15 DIAGNOSIS — R7303 Prediabetes: Secondary | ICD-10-CM

## 2024-03-15 DIAGNOSIS — I4719 Other supraventricular tachycardia: Secondary | ICD-10-CM

## 2024-03-15 DIAGNOSIS — G4733 Obstructive sleep apnea (adult) (pediatric): Secondary | ICD-10-CM

## 2024-03-15 NOTE — Patient Instructions (Addendum)
 Medication Instructions:  No changes    You can purchase  over the counter -- anti -fungal creams - ( athlete/jock itch creams )   *If you need a refill on your cardiac medications before your next appointment, please call your pharmacy*   Lab Work: Not needed If you have labs (blood work) drawn today and your tests are completely normal, you will receive your results only by: MyChart Message (if you have MyChart) OR A paper copy in the mail If you have any lab test that is abnormal or we need to change your treatment, we will call you to review the results.   Testing/Procedures:  Not needed  Follow-Up: At [facility], you and your health needs are our priority.  As part of our continuing mission to provide you with exceptional heart care, we have created designated Provider Care Teams.  These Care Teams include your primary Cardiologist (physician) and Advanced Practice Providers (APPs -  Physician Assistants and Nurse Practitioners) who all work together to provide you with the care you need, when you need it.     Your next appointment:   3 month(s)  The format for your next appointment:   In Person  Provider:   Josefa Beauvais, NP or Lum Louis, NP       and then Dr Francyne in 6 months    Other Instructions    Please do daily weights and bring  the  readings to your appointment

## 2024-03-15 NOTE — Progress Notes (Unsigned)
 "   Cardiology office note   Date:  03/15/2024   ID:  [name], [name] Sep 30, 1940, MRN [id number]   PCP:  Jared Lombard, MD  Cardiologist:  Jared Balding, MD  Electrophysiologist:  Jared ONEIDA HOLTS, MD (Inactive)   Evaluation Performed:  Follow-Up Visit  Chief Complaint: Pacemaker follow-up  History of Present Illness:    [name] is a 84 y.o. male with sick sinus syndrome and tachycardia-bradycardia syndrome, paroxysmal atrial tachycardia (possible AVNRT), second-degree atrioventricular block with bradycardia and near syncope for which he received a pacemaker in February 2019.  Additional problems include mild obesity, hyperlipidemia, essential hypertension, obstructive sleep apnea, gout.  Slow pathway radiofrequency ablation was attempted 02/14/2020 by Dr. Holts, but abandoned due to the high risk of complete heart block.  He was started on amiodarone  at that time.  He continues to do well and is quite active for his age.  His pacemaker shows 6-7 hours of activity a day.  He has not had any new cardiovascular problems and has not been troubled by palpitations.  Has occasional mild left ankle swelling (has chronic organized clot in one of the popliteal veins from a remote DVT), but this resolves if he keeps his leg elevated.  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  Denies angina or dyspnea at rest or with activity.  Does not have orthostatic hypotension or claudication.  Has not had any recent gout attacks.  Pacemaker interrogation shows normal device function.  He has 84% atrial pacing and 92% ventricular pacing.  The heart rate histogram looks much more appropriate after the adjustments to his sensor at his last visit.  Estimated generator longevity is about 5 years and all lead parameters are excellent.  Presenting rhythm is AV sequential pacing.  He has not had any episodes of atrial mode switch or ventricular tachycardia.  His pacemaker system is  MRI conditional (Azure XT DR MRI and 5076 leads).  Metabolic control is good.  Most recent labs from 01/11/2023 showed LDL 78, triglycerides 48, HDL 81, hemoglobin A1c 6.0%.  He has chronic kidney disease and the most recent creatinine was at baseline of 1.62.  Electrolytes are normal.   Past Medical History:  Diagnosis Date   Arthritis    probably; hands (04/12/2017)   High cholesterol    History of gout    Hypertension    OSA (obstructive sleep apnea)    per pt noncompliant CPAP has not use it since 2015   Presence of permanent cardiac pacemaker 04/12/2017   Dual Chamber   Wears dentures    upper   Past Surgical History:  Procedure Laterality Date   CHOLECYSTECTOMY N/A 08/19/2017   Procedure: LAPAROSCOPIC ASSISTED OPEN CHOLECYSTECTOMY WITH INTRAOPERATIVE CHOLANGIOGRAM;  Surgeon: Eletha Boas, MD;  Location: [facility];  Service: General;  Laterality: N/A;   ELBOW BURSA SURGERY Left 07-21-2009   extensive bursectomy/ tenosynovectomy/ ulnar nerve release   EUS N/A 08/18/2017   Procedure: ESOPHAGEAL ENDOSCOPIC ULTRASOUND (EUS) RADIAL;  Surgeon: Teressa Toribio SQUIBB, MD;  Location: [facility];  Service: Endoscopy;  Laterality: N/A;   HAMMER TOE SURGERY Right 2015   HAMMER TOE SURGERY Right 12/11/2014   Procedure: 4th HAMMER TOE CORRECTION, EXCISION LESION 3rd TOE RIGHT FOOT;  Surgeon: Glendale Many, DPM;  Location: [facility];  Service: Podiatry;  Laterality: Right;   INSERT / REPLACE / REMOVE PACEMAKER  04/12/2017   Dual Chamber   MICROLARYNGOSCOPY  06-07-2006   w/  Excision bilateral  vocal cord lesions (bilateral benign nodules)   PACEMAKER IMPLANT N/A 04/12/2017   Procedure: PACEMAKER IMPLANT - Dual Chamber;  Surgeon: Francyne Headland, MD;  Location: [facility];  Service: Cardiovascular;  Laterality: N/A;   SVT ABLATION N/A 02/14/2020   Procedure: SVT ABLATION;  Surgeon: Cindie Jared DASEN, MD;  Location: [facility];  Service: Cardiovascular;  Laterality:  N/A;     Current Meds  Medication Sig   amiodarone  (PACERONE ) 200 MG tablet TAKE 1 TABLET BY MOUTH EVERY DAY   aspirin  EC 81 MG tablet Take 81 mg by mouth daily.   atorvastatin  (LIPITOR) 40 MG tablet Take 1 tablet by mouth daily.   empagliflozin  (JARDIANCE ) 10 MG TABS tablet Take 1 tablet (10 mg total) by mouth daily.   fluticasone (FLONASE) 50 MCG/ACT nasal spray Place 1 spray into both nostrils daily as needed for rhinitis.    furosemide  (LASIX ) 40 MG tablet Take 1 tablet (40 mg total) by mouth daily.   losartan  (COZAAR ) 50 MG tablet TAKE 1 TABLET BY MOUTH EVERY DAY   Multiple Vitamin (MULTIVITAMIN WITH MINERALS) TABS tablet Take 1 tablet by mouth daily.   potassium chloride  SA (KLOR-CON  M) 20 MEQ tablet Take 20 mEq by mouth daily.   tamsulosin  (FLOMAX ) 0.4 MG CAPS capsule Take 0.4 mg by mouth daily.   triamcinolone cream (KENALOG) 0.5 % Apply 1 application topically in the morning and at bedtime.     Allergies:   Patient has no known allergies.   Social History   Tobacco Use   Smoking status: Never   Smokeless tobacco: Never  Vaping Use   Vaping status: Never Used  Substance Use Topics   Alcohol use: Yes    Alcohol/week: 2.0 standard drinks of alcohol    Types: 2 Standard drinks or equivalent per week   Drug use: No     Family Hx: The patient's family history includes Heart attack in his sister; Heart attack (age of onset: 40) in his father; Osteoarthritis in his father; Stroke (age of onset: 76) in his mother. There is no history of Colon cancer.  ROS:   Please see the history of present illness.   All other systems are reviewed and are negative.   Prior CV studies:   The following studies were reviewed today:  Comprehensive pacemaker check performed today.  See results above.  Labs/Other Tests and Data Reviewed:    EKG:   EKG Interpretation Date/Time:    Ventricular Rate:    PR Interval:    QRS Duration:    QT Interval:    QTC Calculation:   R Axis:       Text Interpretation:           Recent Labs: 11/17/2023: ALT 56 03/01/2024: BNP 102.2; BUN 21; Creatinine, Ser 1.53; Potassium 4.2; Sodium 141  01/11/2023 hemoglobin A1c 6%, creatinine 1.62, potassium 4.5, all liver function tests normal   Recent Lipid Panel 01/11/2023  Cholesterol 163, triglycerides 48, HDL 75, LDL 78   Wt Readings from Last 3 Encounters:  02/19/24 223 lb (101.2 kg)  05/18/23 208 lb 3.2 oz (94.4 kg)  05/09/22 211 lb 6.4 oz (95.9 kg)     Objective:    Vital Signs:  BP (!) 104/44 (BP Location: Left Arm, Patient Position: Sitting, Cuff Size: Large)   Pulse 73   Ht 5' 10 (1.778 m)   SpO2 98%   BMI 32.00 kg/m      General: Alert, oriented x3, no distress, mildly  obese, but also appears quite fit for his age.  Healthy left subclavian pacemaker site. Head: no evidence of trauma, PERRL, EOMI, no exophtalmos or lid lag, no myxedema, no xanthelasma; normal ears, nose and oropharynx Neck: normal jugular venous pulsations and no hepatojugular reflux; brisk carotid pulses without delay and no carotid bruits Chest: clear to auscultation, no signs of consolidation by percussion or palpation, normal fremitus, symmetrical and full respiratory excursions Cardiovascular: normal position and quality of the apical impulse, regular rhythm, normal first and second heart sounds, no murmurs, rubs or gallops Abdomen: no tenderness or distention, no masses by palpation, no abnormal pulsatility or arterial bruits, normal bowel sounds, no hepatosplenomegaly Extremities: no clubbing, cyanosis or edema; 2+ radial, ulnar and brachial pulses bilaterally; 2+ right femoral, posterior tibial and dorsalis pedis pulses; 2+ left femoral, posterior tibial and dorsalis pedis pulses; no subclavian or femoral bruits Neurological: grossly nonfocal Psych: Normal mood and affect    ASSESSMENT & PLAN:    No diagnosis found.    AVNRT: Denies palpitations and no episodes have been recorded by  his pacemaker since May 2023.  There has been significant prolongation of AV conduction time after he started the amiodarone .  Although EP study demonstrated dual pathway AV node physiology, ablation could not be successfully performed.  He has been asymptomatic and arrhythmia free on amiodarone . 2nd deg AVB, MT1: Although he is not actually pacemaker dependent he has a very high prevalence of ventricular pacing due to conduction abnormalities, especially after starting amiodarone . PM: Better heart rate histogram and a little less ventricular pacing with the changes made at last appointment.  I think eventually we may just switch his device to physiological AV delays and committed ventricular pacing.  No additional changes were made today. Amiodarone : As talked this well without side effects.  Needs periodic liver function tests and TSH checked, these have been checked within the last 6 months and were normal range. HTN: Well-controlled.  Continue losartan . OSA: He reports good compliance with CPAP and denies daytime hypersomnolence. HLP: All lipid parameters in target range on labs from November, monitored by Dr. Verdia.  Continue atorvastatin . Prediabetes: Hemoglobin A1c 6.0%, advised weight loss.  Avoid sugars and starches with high glycemic index.  He is quite active physically.  There are no Patient Instructions on file for this visit.    Signed, Jared Balding, MD  03/15/2024 9:30 AM    [facility] "

## 2024-04-05 ENCOUNTER — Telehealth: Payer: Self-pay | Admitting: Pharmacy Technician

## 2024-04-05 NOTE — Telephone Encounter (Signed)
 You are correct, we did not make any medication.  He should continue taking the aspirin , atorvastatin , amiodarone , Jardiance , furosemide  and losartan  in the doses listed.

## 2024-04-05 NOTE — Telephone Encounter (Signed)
 Hi, the patient called me and asked me if he was still supposed to be taking all his heart meds. Looks like maybe there was a question on his amiodarone  the last visit he had. He is asking if someone can call him and confirm which meds he should have from the cardiology that he should still be taking. Thank you!

## 2024-04-05 NOTE — Telephone Encounter (Signed)
 Per Dr. Francyne: You are correct, we did not make any medication.  He should continue taking the aspirin , atorvastatin , amiodarone , Jardiance , furosemide  and losartan  in the doses listed.  Updated patient that he had no changes with his medications. Patient asking about Furosemide , Amiodarone , and Jardiance . Pt states he has questions about refills. Has refills for Furosemide  and Amiodarone . Spoke with [facility] and pt picked up 90 day supply Jardiance  02/18/2025. Pt states he ran out a week ago. Pt must have misplaced his other 2 bottles and gave him generic name. Advised patient to try and find them, as insurance will not pay for them again. Also advised him if he needed any further assistance with this to let us  know, as he needs to be taking this medication. Pt verbalizes understanding.

## 2024-04-18 ENCOUNTER — Ambulatory Visit

## 2024-06-21 ENCOUNTER — Ambulatory Visit: Admitting: Emergency Medicine

## 2024-07-18 ENCOUNTER — Ambulatory Visit

## 2024-10-17 ENCOUNTER — Ambulatory Visit

## 2025-01-16 ENCOUNTER — Ambulatory Visit

## 2025-04-17 ENCOUNTER — Ambulatory Visit
